# Patient Record
Sex: Male | Born: 1970 | Race: Black or African American | Hispanic: No | Marital: Single | Smoking: Current every day smoker
Health system: Southern US, Community
[De-identification: ages and names within clinical notes are randomized; demographics above are authoritative.]

## PROBLEM LIST (undated history)

## (undated) DIAGNOSIS — I10 Essential (primary) hypertension: Secondary | ICD-10-CM

## (undated) DIAGNOSIS — F209 Schizophrenia, unspecified: Secondary | ICD-10-CM

## (undated) DIAGNOSIS — F319 Bipolar disorder, unspecified: Secondary | ICD-10-CM

## (undated) DIAGNOSIS — M109 Gout, unspecified: Secondary | ICD-10-CM

## (undated) DIAGNOSIS — J189 Pneumonia, unspecified organism: Secondary | ICD-10-CM

## (undated) HISTORY — PX: OTHER SURGICAL HISTORY: SHX169

---

## 1997-10-15 ENCOUNTER — Emergency Department (HOSPITAL_COMMUNITY): Admission: EM | Admit: 1997-10-15 | Discharge: 1997-10-15 | Payer: Self-pay | Admitting: Emergency Medicine

## 1998-01-13 ENCOUNTER — Inpatient Hospital Stay (HOSPITAL_COMMUNITY): Admission: EM | Admit: 1998-01-13 | Discharge: 1998-01-19 | Payer: Self-pay | Admitting: *Deleted

## 1998-05-31 ENCOUNTER — Emergency Department (HOSPITAL_COMMUNITY): Admission: EM | Admit: 1998-05-31 | Discharge: 1998-05-31 | Payer: Self-pay | Admitting: Emergency Medicine

## 1998-05-31 ENCOUNTER — Encounter: Payer: Self-pay | Admitting: Emergency Medicine

## 1998-07-07 ENCOUNTER — Emergency Department (HOSPITAL_COMMUNITY): Admission: EM | Admit: 1998-07-07 | Discharge: 1998-07-07 | Payer: Self-pay | Admitting: Emergency Medicine

## 1998-07-07 ENCOUNTER — Encounter: Payer: Self-pay | Admitting: Emergency Medicine

## 1998-10-01 ENCOUNTER — Emergency Department (HOSPITAL_COMMUNITY): Admission: EM | Admit: 1998-10-01 | Discharge: 1998-10-01 | Payer: Self-pay | Admitting: Emergency Medicine

## 2000-10-17 ENCOUNTER — Encounter: Payer: Self-pay | Admitting: Emergency Medicine

## 2000-10-17 ENCOUNTER — Emergency Department (HOSPITAL_COMMUNITY): Admission: EM | Admit: 2000-10-17 | Discharge: 2000-10-17 | Payer: Self-pay | Admitting: Emergency Medicine

## 2000-10-22 ENCOUNTER — Encounter: Payer: Self-pay | Admitting: Emergency Medicine

## 2000-10-22 ENCOUNTER — Emergency Department (HOSPITAL_COMMUNITY): Admission: EM | Admit: 2000-10-22 | Discharge: 2000-10-22 | Payer: Self-pay | Admitting: Emergency Medicine

## 2000-10-22 ENCOUNTER — Encounter: Payer: Self-pay | Admitting: *Deleted

## 2002-10-21 ENCOUNTER — Inpatient Hospital Stay (HOSPITAL_COMMUNITY): Admission: EM | Admit: 2002-10-21 | Discharge: 2002-10-25 | Payer: Self-pay | Admitting: Psychiatry

## 2002-10-26 ENCOUNTER — Emergency Department (HOSPITAL_COMMUNITY): Admission: EM | Admit: 2002-10-26 | Discharge: 2002-10-26 | Payer: Self-pay | Admitting: Emergency Medicine

## 2002-11-19 ENCOUNTER — Inpatient Hospital Stay (HOSPITAL_COMMUNITY): Admission: EM | Admit: 2002-11-19 | Discharge: 2002-11-28 | Payer: Self-pay | Admitting: Psychiatry

## 2003-01-31 ENCOUNTER — Emergency Department (HOSPITAL_COMMUNITY): Admission: EM | Admit: 2003-01-31 | Discharge: 2003-01-31 | Payer: Self-pay | Admitting: Emergency Medicine

## 2003-05-01 ENCOUNTER — Emergency Department (HOSPITAL_COMMUNITY): Admission: EM | Admit: 2003-05-01 | Discharge: 2003-05-02 | Payer: Self-pay | Admitting: Emergency Medicine

## 2003-05-02 ENCOUNTER — Emergency Department (HOSPITAL_COMMUNITY): Admission: EM | Admit: 2003-05-02 | Discharge: 2003-05-02 | Payer: Self-pay | Admitting: Emergency Medicine

## 2003-12-07 ENCOUNTER — Emergency Department (HOSPITAL_COMMUNITY): Admission: EM | Admit: 2003-12-07 | Discharge: 2003-12-07 | Payer: Self-pay

## 2004-05-03 ENCOUNTER — Emergency Department (HOSPITAL_COMMUNITY): Admission: EM | Admit: 2004-05-03 | Discharge: 2004-05-03 | Payer: Self-pay | Admitting: Emergency Medicine

## 2007-12-27 ENCOUNTER — Ambulatory Visit: Payer: Self-pay | Admitting: Internal Medicine

## 2007-12-27 LAB — CONVERTED CEMR LAB
ALT: 15 units/L (ref 0–53)
Alkaline Phosphatase: 55 units/L (ref 39–117)
BUN: 11 mg/dL (ref 6–23)
Calcium: 9.1 mg/dL (ref 8.4–10.5)
Chloride: 103 meq/L (ref 96–112)
Glucose, Bld: 94 mg/dL (ref 70–99)
HDL: 33 mg/dL — ABNORMAL LOW (ref >39)
LDL Cholesterol: 108 mg/dL — ABNORMAL HIGH (ref 0–99)
Sodium: 138 meq/L (ref 135–145)
TSH: 1.119 microintl units/mL (ref 0.350–5.50)
Total Bilirubin: 0.5 mg/dL (ref 0.3–1.2)
Total Protein: 6.7 g/dL (ref 6.0–8.3)
VLDL: 25 mg/dL (ref 0–40)

## 2010-01-11 ENCOUNTER — Emergency Department (HOSPITAL_COMMUNITY): Admission: EM | Admit: 2010-01-11 | Discharge: 2010-01-11 | Payer: Self-pay | Admitting: Emergency Medicine

## 2010-01-13 ENCOUNTER — Emergency Department (HOSPITAL_COMMUNITY): Admission: EM | Admit: 2010-01-13 | Discharge: 2010-01-13 | Payer: Self-pay | Admitting: Emergency Medicine

## 2010-02-03 ENCOUNTER — Emergency Department (HOSPITAL_COMMUNITY): Admission: EM | Admit: 2010-02-03 | Discharge: 2010-02-03 | Payer: Self-pay | Admitting: Ophthalmology

## 2010-02-26 ENCOUNTER — Emergency Department (HOSPITAL_COMMUNITY): Admission: EM | Admit: 2010-02-26 | Discharge: 2010-02-26 | Payer: Self-pay | Admitting: Emergency Medicine

## 2010-09-25 LAB — BASIC METABOLIC PANEL
GFR calc non Af Amer: >60 mL/min (ref >60)
Potassium: 3.7 mEq/L (ref 3.5–5.1)

## 2010-09-25 LAB — DIFFERENTIAL
Eosinophils Absolute: 0.1 10*3/uL (ref 0.0–0.7)
Eosinophils Relative: 1 % (ref 0–5)
Lymphs Abs: 2.2 10*3/uL (ref 0.7–4.0)
Monocytes Relative: 6 % (ref 3–12)
Neutro Abs: 11.4 10*3/uL — ABNORMAL HIGH (ref 1.7–7.7)

## 2010-09-25 LAB — ABO/RH: ABO/RH(D): O POS

## 2010-09-25 LAB — CBC
HCT: 40.6 % (ref 39.0–52.0)
Hemoglobin: 13.9 g/dL (ref 13.0–17.0)
MCH: 30.4 pg (ref 26.0–34.0)
MCHC: 34.1 g/dL (ref 30.0–36.0)
MCV: 89.2 fL (ref 78.0–100.0)
Platelets: 114 10*3/uL — ABNORMAL LOW (ref 150–400)
RBC: 4.57 MIL/uL (ref 4.22–5.81)
RDW: 13.8 % (ref 11.5–15.5)
RDW: 14.1 % (ref 11.5–15.5)

## 2010-09-25 LAB — URINE MICROSCOPIC-ADD ON

## 2010-09-25 LAB — TYPE AND SCREEN: Antibody Screen: NEGATIVE

## 2010-09-25 LAB — URINALYSIS, ROUTINE W REFLEX MICROSCOPIC
Hgb urine dipstick: NEGATIVE
Leukocytes, UA: NEGATIVE
Specific Gravity, Urine: 1.024 (ref 1.005–1.030)

## 2010-09-25 LAB — RAPID URINE DRUG SCREEN, HOSP PERFORMED
Barbiturates: NOT DETECTED
Benzodiazepines: NOT DETECTED
Opiates: NOT DETECTED

## 2010-09-25 LAB — ETHANOL: Alcohol, Ethyl (B): <5 mg/dL (ref 0–10)
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
## 2010-11-26 NOTE — Discharge Summary (Signed)
NAMEHUDSEN, [name] [name] NO.:  [id number]   MEDICAL RECORD NO.:  [id number]                   PATIENT TYPE:  IPS   LOCATION:  0404                                 FACILITY:  [facility]   PHYSICIAN:  Carolanne Grumbling, M.D.                 DATE OF BIRTH:  Dec 09, 1970   DATE OF ADMISSION:  10/21/2002  DATE OF DISCHARGE:  10/25/2002                                 DISCHARGE SUMMARY   INTRODUCTION:  The patient is a 40 year old male.   INITIAL ASSESSMENT AND DIAGNOSIS:  The patient is a chronic schizophrenic  who is in and out of hospitals.  He has a long history reportedly of not  taking his medications properly which results in readmissions.  He had  missed his Haldol injection and had gotten somewhat out of hand with his  temper and threats towards his mother.  He said he also had been using  cocaine, which certainly did not help his attitude and behavior.  He had  just been released from jail in March of this year because of drug charges  and that apparently was why he missed his last injection because he did not  follow-up.  By the time he was admitted to the hospital, he had already had  the injection and was beginning to do some better.   MENTAL STATUS EXAM:  At the time of the initial evaluation revealed an  alert, oriented young man who was cooperative.  He was somewhat disheveled  and personal hygiene could have been better.  He was concerned about how  long he would have to be in the hospital.  There were no problems related to  his speech.  It was not pressured in any way.  He seemed to be somewhat  guarded and suspicious.  No suicidal thoughts or homicidal thoughts were  present.  Paranoid ideation was reported per history.   PHYSICAL EXAMINATION:  Noncontributory.   ADMISSION DIAGNOSES:   AXIS I:  1. Schizophrenia, paranoid-type.  2. Cocaine abuse and dependence by history.   AXIS II:  No diagnosis.   AXIS III:  No diagnosis.   AXIS IV:   Moderate.   AXIS V:  18/60.   FINDINGS:  All indicated laboratory examinations were within normal limits  or noncontributory except for platelets which were initially 136 and, on  repeat on the 14th, was 133.   HOSPITAL COURSE:  While in the hospital, patient was no problem.  He was  appropriate.  He was cooperative.  Whatever psychosis he had, he kept to  himself.  There was no evidence of any paranoid ideation or delusional  thinking I saw in talking in talking to him or that was reported.  Consequently, because he wanted to be discharged and because there were no  grounds to hold him further, he had consistently denied any suicidal  ideation or threat towards anyone  including his mother, he was discharged  home.  His mother concerned that he was discharged to quickly but she was  reassured that, if problems rose again, she could simply call the magistrate  and have him petitioned for involuntary commitment and he would be brought  back.   DISCHARGE DIAGNOSES:   AXIS I:  1. Schizophrenia, paranoid-type.  2. Cocaine abuse and dependence by history.   AXIS II:  No diagnosis.   AXIS III:  No diagnosis.   AXIS IV:  Moderate.   AXIS V:  Global Assessment of Functioning on discharge 50.   DISCHARGE MEDICATIONS:  1. Haldol 10 mg at bedtime.  2. Cogentin 1 mg twice daily.  3. Monthly Haldol Decanoate injection.   ACTIVITY/DIET:  There were no restrictions placed on his activity or his  diet.   FOLLOW UP:  He was to follow up at the [facility]  with an appointment for the 21st of May with Dr. Lang Snow.                                               Carolanne Grumbling, M.D.    GT/MEDQ  D:  11/14/2002  T:  11/14/2002  Job:  [id number]
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
## 2012-06-18 ENCOUNTER — Emergency Department (HOSPITAL_COMMUNITY): Payer: Medicare Other

## 2012-06-18 ENCOUNTER — Encounter (HOSPITAL_COMMUNITY): Payer: Self-pay | Admitting: *Deleted

## 2012-06-18 ENCOUNTER — Encounter (HOSPITAL_COMMUNITY): Payer: Self-pay | Admitting: Emergency Medicine

## 2012-06-18 ENCOUNTER — Emergency Department (HOSPITAL_COMMUNITY)
Admission: EM | Admit: 2012-06-18 | Discharge: 2012-06-18 | Disposition: A | Discharge status: Home / Self Care | Payer: Medicare Other | Attending: Emergency Medicine | Admitting: Emergency Medicine

## 2012-06-18 ENCOUNTER — Emergency Department (INDEPENDENT_AMBULATORY_CARE_PROVIDER_SITE_OTHER)
Admission: EM | Admit: 2012-06-18 | Discharge: 2012-06-18 | Disposition: A | Discharge status: Other Acute Inpatient Hospital | Payer: Self-pay | Source: Home / Self Care | Attending: Emergency Medicine | Admitting: Emergency Medicine

## 2012-06-18 DIAGNOSIS — R1110 Vomiting, unspecified: Secondary | ICD-10-CM

## 2012-06-18 DIAGNOSIS — F209 Schizophrenia, unspecified: Secondary | ICD-10-CM | POA: Insufficient documentation

## 2012-06-18 DIAGNOSIS — Z79899 Other long term (current) drug therapy: Secondary | ICD-10-CM | POA: Insufficient documentation

## 2012-06-18 DIAGNOSIS — F17200 Nicotine dependence, unspecified, uncomplicated: Secondary | ICD-10-CM | POA: Insufficient documentation

## 2012-06-18 DIAGNOSIS — K2970 Gastritis, unspecified, without bleeding: Secondary | ICD-10-CM | POA: Insufficient documentation

## 2012-06-18 DIAGNOSIS — R112 Nausea with vomiting, unspecified: Secondary | ICD-10-CM | POA: Insufficient documentation

## 2012-06-18 HISTORY — DX: Schizophrenia, unspecified: F20.9

## 2012-06-18 LAB — CBC WITH DIFFERENTIAL/PLATELET
Basophils Absolute: 0 10*3/uL (ref 0.0–0.1)
Basophils Relative: 0 % (ref 0–1)
Eosinophils Relative: 0 % (ref 0–5)
Lymphocytes Relative: 19 % (ref 12–46)
Monocytes Absolute: 0.6 10*3/uL (ref 0.1–1.0)
Monocytes Relative: 6 % (ref 3–12)
Neutrophils Relative %: 74 % (ref 43–77)
Platelets: 143 10*3/uL — ABNORMAL LOW (ref 150–400)
RBC: 5.53 MIL/uL (ref 4.22–5.81)
RDW: 15.4 % (ref 11.5–15.5)
WBC: 9.4 10*3/uL (ref 4.0–10.5)

## 2012-06-18 LAB — COMPREHENSIVE METABOLIC PANEL
Albumin: 3.6 g/dL (ref 3.5–5.2)
CO2: 30 mEq/L (ref 19–32)
Calcium: 9.4 mg/dL (ref 8.4–10.5)
Chloride: 101 mEq/L (ref 96–112)
GFR calc non Af Amer: >90 mL/min (ref >90)
Glucose, Bld: 90 mg/dL (ref 70–99)

## 2012-06-18 LAB — POCT I-STAT TROPONIN I: Troponin i, poc: 0.02 ng/mL (ref 0.00–0.08)

## 2012-06-18 MED ORDER — ONDANSETRON 4 MG PO TBDP: 4.0000 mg | ORAL_TABLET | Freq: Three times a day (TID) | ORAL | Status: DC

## 2012-06-18 MED ORDER — FAMOTIDINE 20 MG PO TABS: 20.0000 mg | ORAL_TABLET | Freq: Two times a day (BID) | ORAL | Status: DC

## 2012-06-18 MED ORDER — ONDANSETRON 4 MG PO TBDP
4.0000 mg | ORAL_TABLET | Freq: Once | ORAL | Status: AC
  Administered 2012-06-18: 4 mg via ORAL
  Filled 2012-06-18: qty 1

## 2012-06-18 MED ORDER — GI COCKTAIL ~~LOC~~
30.0000 mL | Freq: Once | ORAL | Status: AC
  Administered 2012-06-18: 30 mL via ORAL
  Filled 2012-06-18: qty 30

## 2012-06-18 NOTE — ED Notes (Signed)
Pt reports vomiting x 2 days.  Pt reports abdominal cramping and chest pain.  Pt reports SOB.  Pt ambulatory without difficulty.

## 2012-06-18 NOTE — ED Notes (Signed)
Reports nausea and vomiting since last night.  No medication taken to relief sx.  Denies diarrhea.

## 2012-06-18 NOTE — ED Provider Notes (Signed)
History     CSN: [id number]  Arrival date & time 06/18/12  1737   First MD Initiated Contact with Patient 06/18/12 2036      Chief Complaint  Patient presents with  . Emesis    (Consider location/radiation/quality/duration/timing/severity/associated sxs/prior treatment) HPI Comments: This is a 41 year old gentleman, who drank on a daily basis until 3 weeks, ago.  Nares.  Reporting 2, days of epigastric pain, with nausea and vomiting.  If she eats food, but not fluids.  He, says is not nauseated.  In between episodes of vomiting.  He only vomits the food that he ate within 30 minutes.  It denying any shortness of breath, diaphoresis.  Patient is a 41 y.o. male presenting with vomiting. The history is provided by the patient.  Emesis  This is a new problem. The current episode started 2 days ago. The problem occurs 2 to 4 times per day. The problem has not changed since onset.There has been no fever. Associated symptoms include abdominal pain. Pertinent negatives include no chills, no cough, no fever, no headaches and no myalgias.    Past Medical History  Diagnosis Date  . Schizophrenia     History reviewed. No pertinent past surgical history.  History reviewed. No pertinent family history.  History  Substance Use Topics  . Smoking status: Current Every Day Smoker -- 1.0 packs/day    Types: Cigarettes  . Smokeless tobacco: Not on file  . Alcohol Use: No      Review of Systems  Constitutional: Negative for fever and chills.  HENT: Negative for rhinorrhea.   Respiratory: Negative for cough.   Cardiovascular: Negative for chest pain.  Gastrointestinal: Positive for vomiting and abdominal pain.  Musculoskeletal: Negative for myalgias.  Skin: Negative for rash and wound.  Neurological: Negative for dizziness, weakness and headaches.    Allergies  Review of patient's allergies indicates no known allergies.  Home Medications   Current Outpatient Rx  Name  Route  Sig   Dispense  Refill  . BENZTROPINE MESYLATE 2 MG PO TABS   Oral   Take 2 mg by mouth 2 (two) times daily.         [name] Kitchen CLONAZEPAM 1 MG PO TABS   Oral   Take 1 mg by mouth 2 (two) times daily as needed. For anxiety         . HALOPERIDOL 10 MG PO TABS   Oral   Take 10 mg by mouth 4 (four) times daily.         [name] Kitchen FAMOTIDINE 20 MG PO TABS   Oral   Take 1 tablet (20 mg total) by mouth 2 (two) times daily.   30 tablet   0   . ONDANSETRON 4 MG PO TBDP   Oral   Take 1 tablet (4 mg total) by mouth 3 (three) times daily before meals.   20 tablet   0     BP 143/90  Pulse 62  Temp 98.9 F (37.2 C) (Oral)  Resp 20  Ht 5\' 8"  (1.727 m)  Wt 265 lb (120.203 kg)  BMI 40.29 kg/m2  SpO2 98%  Physical Exam  Constitutional: He is oriented to person, place, and time. He appears well-nourished.       Obese  HENT:  Head: Normocephalic.  Eyes: Pupils are equal, round, and reactive to light.  Neck: Normal range of motion.  Cardiovascular: Normal rate.   Pulmonary/Chest: Effort normal and breath sounds normal.  Abdominal: Soft. He exhibits no distension.  There is no hepatosplenomegaly. There is tenderness in the epigastric area. There is no CVA tenderness.    Musculoskeletal: Normal range of motion.  Neurological: He is alert and oriented to person, place, and time.  Skin: Skin is warm.    ED Course  Procedures (including critical care time)  Labs Reviewed  CBC WITH DIFFERENTIAL - Abnormal; Notable for the following:    Platelets 143 (*)     All other components within normal limits  COMPREHENSIVE METABOLIC PANEL  LIPASE, BLOOD  POCT I-STAT TROPONIN I   Dg Chest 2 View  06/18/2012  *RADIOLOGY REPORT*  Clinical Data: Central chest pain and vomiting.  CHEST - 2 VIEW  Comparison: None.  Findings: The heart size and pulmonary vascularity are normal. The lungs appear clear and expanded without focal air space disease or consolidation. No blunting of the costophrenic angles.  No  pneumothorax.  Mediastinal contours appear intact.  IMPRESSION: No evidence of active pulmonary disease.   Original Report Authenticated By: Burman Nieves, M.D.      1. 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

## 2012-06-18 NOTE — ED Provider Notes (Addendum)
History     CSN: [id number]  Arrival date & time 06/18/12  1529   First MD Initiated Contact with Patient 06/18/12 1627      Chief Complaint  Patient presents with  . Nausea  . Emesis    (Consider location/radiation/quality/duration/timing/severity/associated sxs/prior treatment) HPI Comments: Patient presents urgent care describing the since last night he's been nauseous and vomiting having upper abdominal pain. Denies any fevers diarrhea so cold-like symptoms. Denies any shortness of breath when asked about chest pains he points towards a soup sternal and epigastric region.  Patient is a 41 y.o. male presenting with vomiting. The history is provided by the patient.  Emesis  This is a new problem. The current episode started 12 to 24 hours ago. The problem occurs 2 to 4 times per day. The problem has been gradually worsening. The emesis has an appearance of stomach contents. There has been no fever. Associated symptoms include abdominal pain. Pertinent negatives include no arthralgias, no chills, no cough, no diarrhea, no fever, no headaches, no sweats and no URI.    History reviewed. No pertinent past medical history.  No past surgical history on file.  No family history on file.  History  Substance Use Topics  . Smoking status: Not on file  . Smokeless tobacco: Not on file  . Alcohol Use: Not on file      Review of Systems  Constitutional: Positive for activity change. Negative for fever and chills.  Respiratory: Negative for cough, shortness of breath, wheezing and stridor.   Cardiovascular: Negative for palpitations and leg swelling.  Gastrointestinal: Positive for nausea, vomiting and abdominal pain. Negative for diarrhea and blood in stool.  Musculoskeletal: Negative for arthralgias.  Neurological: Negative for dizziness and headaches.    Allergies  Review of patient's allergies indicates no known allergies.  Home Medications   Current Outpatient Rx  Name   Route  Sig  Dispense  Refill  . BENZTROPINE MESYLATE PO   Oral   Take by mouth.         [name] Kitchen KLONOPIN PO   Oral   Take by mouth.         [name] Kitchen HALOPERIDOL PO   Oral   Take by mouth.           BP 126/85  Pulse 64  Temp 98.2 F (36.8 C) (Oral)  Resp 16  SpO2 100%  Physical Exam  Vitals reviewed. Constitutional: Vital signs are normal. He appears well-developed.  Non-toxic appearance. He does not have a sickly appearance. He does not appear ill. No distress.  HENT:  Head: Normocephalic.  Eyes: Conjunctivae normal are normal. No scleral icterus.  Cardiovascular: Normal rate and regular rhythm.  Exam reveals no gallop and no friction rub.   No murmur heard. Pulmonary/Chest: Effort normal and breath sounds normal.  Abdominal: Soft. He exhibits no distension and no mass. There is no tenderness. There is no rebound and no guarding.  Skin: He is not diaphoretic.    ED Course  Procedures (including critical care time)  Labs Reviewed - No data to display No results found.   1. Vomiting       MDM  Vomiting, nausea normal abdominal exam. Abnormal EKG. Will be transfer to the emergency department for further workup and evaluation and monitoring. A caveat applies, for possibly mental retardation    Jimmie Molly, MD 06/18/12 1718  Jimmie Molly, MD 06/18/12 [phone]

## 2012-06-18 NOTE — ED Notes (Signed)
The patient is AOx4 and comfortable with her discharge instructions. 

## 2012-06-19 NOTE — ED Provider Notes (Signed)
Medical screening examination/treatment/procedure(s) were performed by non-physician practitioner and as supervising physician I was immediately available for consultation/collaboration.   Abisola Carrero, MD 06/19/12 0021 

## 2012-11-16 ENCOUNTER — Emergency Department (HOSPITAL_COMMUNITY): Payer: Medicare Other

## 2012-11-16 ENCOUNTER — Other Ambulatory Visit: Payer: Self-pay

## 2012-11-16 ENCOUNTER — Inpatient Hospital Stay (HOSPITAL_COMMUNITY)
Admission: EM | Admit: 2012-11-16 | Discharge: 2012-11-19 | DRG: 070 | Disposition: A | Discharge status: Home / Self Care | Payer: Medicare Other | Attending: Internal Medicine | Admitting: Internal Medicine

## 2012-11-16 ENCOUNTER — Encounter (HOSPITAL_COMMUNITY): Payer: Self-pay | Admitting: Emergency Medicine

## 2012-11-16 DIAGNOSIS — J9601 Acute respiratory failure with hypoxia: Secondary | ICD-10-CM

## 2012-11-16 DIAGNOSIS — Z6838 Body mass index (BMI) 38.0-38.9, adult: Secondary | ICD-10-CM

## 2012-11-16 DIAGNOSIS — J9600 Acute respiratory failure, unspecified whether with hypoxia or hypercapnia: Secondary | ICD-10-CM | POA: Diagnosis present

## 2012-11-16 DIAGNOSIS — E876 Hypokalemia: Secondary | ICD-10-CM | POA: Diagnosis present

## 2012-11-16 DIAGNOSIS — F209 Schizophrenia, unspecified: Secondary | ICD-10-CM | POA: Diagnosis present

## 2012-11-16 DIAGNOSIS — J690 Pneumonitis due to inhalation of food and vomit: Secondary | ICD-10-CM | POA: Diagnosis present

## 2012-11-16 DIAGNOSIS — I10 Essential (primary) hypertension: Secondary | ICD-10-CM | POA: Diagnosis present

## 2012-11-16 DIAGNOSIS — G9340 Encephalopathy, unspecified: Principal | ICD-10-CM | POA: Diagnosis present

## 2012-11-16 DIAGNOSIS — J9602 Acute respiratory failure with hypercapnia: Secondary | ICD-10-CM | POA: Diagnosis present

## 2012-11-16 DIAGNOSIS — R4182 Altered mental status, unspecified: Secondary | ICD-10-CM

## 2012-11-16 DIAGNOSIS — T43505A Adverse effect of unspecified antipsychotics and neuroleptics, initial encounter: Secondary | ICD-10-CM | POA: Diagnosis present

## 2012-11-16 DIAGNOSIS — Y92009 Unspecified place in unspecified non-institutional (private) residence as the place of occurrence of the external cause: Secondary | ICD-10-CM

## 2012-11-16 DIAGNOSIS — F1410 Cocaine abuse, uncomplicated: Secondary | ICD-10-CM | POA: Diagnosis present

## 2012-11-16 DIAGNOSIS — F17200 Nicotine dependence, unspecified, uncomplicated: Secondary | ICD-10-CM | POA: Diagnosis present

## 2012-11-16 LAB — RAPID URINE DRUG SCREEN, HOSP PERFORMED
Amphetamines: NOT DETECTED
Barbiturates: NOT DETECTED
Opiates: NOT DETECTED
Tetrahydrocannabinol: NOT DETECTED

## 2012-11-16 LAB — COMPREHENSIVE METABOLIC PANEL
ALT: 25 U/L (ref 0–53)
AST: 20 U/L (ref 0–37)
Albumin: 3.4 g/dL — ABNORMAL LOW (ref 3.5–5.2)
Alkaline Phosphatase: 61 U/L (ref 39–117)
BUN: 6 mg/dL (ref 6–23)
Chloride: 104 mEq/L (ref 96–112)
Potassium: 3.7 mEq/L (ref 3.5–5.1)
Sodium: 139 mEq/L (ref 135–145)
Total Bilirubin: 0.2 mg/dL — ABNORMAL LOW (ref 0.3–1.2)
Total Protein: 6.2 g/dL (ref 6.0–8.3)

## 2012-11-16 LAB — CBC WITH DIFFERENTIAL/PLATELET
Basophils Relative: 0 % (ref 0–1)
Hemoglobin: 14 g/dL (ref 13.0–17.0)
MCHC: 33.2 g/dL (ref 30.0–36.0)
Monocytes Relative: 8 % (ref 3–12)
Neutro Abs: 7.9 10*3/uL — ABNORMAL HIGH (ref 1.7–7.7)
Neutrophils Relative %: 79 % — ABNORMAL HIGH (ref 43–77)
Platelets: 139 10*3/uL — ABNORMAL LOW (ref 150–400)
RBC: 4.96 MIL/uL (ref 4.22–5.81)

## 2012-11-16 LAB — ACETAMINOPHEN LEVEL: Acetaminophen (Tylenol), Serum: <15 ug/mL (ref 10–30)

## 2012-11-16 LAB — POCT I-STAT, CHEM 8
BUN: 5 mg/dL — ABNORMAL LOW (ref 6–23)
Creatinine, Ser: 0.6 mg/dL (ref 0.50–1.35)
Potassium: 3.8 mEq/L (ref 3.5–5.1)
Sodium: 139 mEq/L (ref 135–145)
TCO2: 29 mmol/L (ref 0–100)

## 2012-11-16 LAB — POCT I-STAT 3, ART BLOOD GAS (G3+)
Bicarbonate: 24.7 mEq/L — ABNORMAL HIGH (ref 20.0–24.0)
Patient temperature: 98.6
TCO2: 26 mmol/L (ref 0–100)
pCO2 arterial: 47.1 mmHg — ABNORMAL HIGH (ref 35.0–45.0)
pH, Arterial: 7.328 — ABNORMAL LOW (ref 7.350–7.450)
pO2, Arterial: 74 mmHg — ABNORMAL LOW (ref 80.0–100.0)

## 2012-11-16 LAB — GLUCOSE, CAPILLARY: Glucose-Capillary: 148 mg/dL — ABNORMAL HIGH (ref 70–99)

## 2012-11-16 LAB — URINALYSIS, ROUTINE W REFLEX MICROSCOPIC
Bilirubin Urine: NEGATIVE
Ketones, ur: NEGATIVE mg/dL
Leukocytes, UA: NEGATIVE
Nitrite: NEGATIVE
Protein, ur: NEGATIVE mg/dL

## 2012-11-16 LAB — SALICYLATE LEVEL: Salicylate Lvl: <2 mg/dL — ABNORMAL LOW (ref 2.8–20.0)

## 2012-11-16 LAB — ETHANOL: Alcohol, Ethyl (B): <11 mg/dL (ref 0–11)

## 2012-11-16 MED ORDER — SODIUM CHLORIDE 0.9 % IV SOLN
1000.0000 mL | Freq: Once | INTRAVENOUS | Status: AC
  Administered 2012-11-16: 1000 mL via INTRAVENOUS

## 2012-11-16 MED ORDER — SODIUM CHLORIDE 0.9 % IV SOLN
INTRAVENOUS | Status: DC
  Administered 2012-11-16 – 2012-11-17 (×3): via INTRAVENOUS

## 2012-11-16 MED ORDER — LIDOCAINE HCL (CARDIAC) 20 MG/ML IV SOLN
INTRAVENOUS | Status: AC
  Filled 2012-11-16: qty 5

## 2012-11-16 MED ORDER — ETOMIDATE 2 MG/ML IV SOLN
INTRAVENOUS | Status: AC
  Administered 2012-11-17: 20 mg
  Filled 2012-11-16: qty 20

## 2012-11-16 MED ORDER — ROCURONIUM BROMIDE 50 MG/5ML IV SOLN
INTRAVENOUS | Status: AC
  Filled 2012-11-16: qty 2

## 2012-11-16 MED ORDER — SUCCINYLCHOLINE CHLORIDE 20 MG/ML IJ SOLN
INTRAMUSCULAR | Status: AC
  Administered 2012-11-17: 100 mg
  Filled 2012-11-16: qty 1

## 2012-11-16 MED ORDER — SODIUM CHLORIDE 0.9 % IV SOLN
1000.0000 mL | INTRAVENOUS | Status: DC
  Administered 2012-11-17 – 2012-11-18 (×2): 1000 mL via INTRAVENOUS

## 2012-11-16 MED ORDER — NALOXONE HCL 0.4 MG/ML IJ SOLN
INTRAMUSCULAR | Status: AC
  Filled 2012-11-16: qty 4

## 2012-11-16 MED ORDER — NALOXONE HCL 0.4 MG/ML IJ SOLN
INTRAMUSCULAR | Status: AC
  Administered 2012-11-16: 2 mg
  Filled 2012-11-16: qty 1

## 2012-11-16 MED ORDER — NALOXONE HCL 1 MG/ML IJ SOLN: 2.0000 mg | Freq: Once | INTRAMUSCULAR | Status: AC

## 2012-11-16 NOTE — ED Notes (Signed)
EDP made aware of GCS of 3

## 2012-11-16 NOTE — ED Provider Notes (Signed)
History     CSN: [id number]  Arrival date & time 11/16/12  2101   First MD Initiated Contact with Patient 11/16/12 2121      Chief Complaint  Patient presents with  . Drug Overdose    (Consider location/radiation/quality/duration/timing/severity/associated sxs/prior treatment) Patient is a 42 y.o. male presenting with Overdose. The history is provided by the EMS personnel. No language interpreter was used.  Drug Overdose This is a new problem. The current episode started less than 1 hour ago (Patient was found in the back seat of his car unresponsive. Paramedics came and gave him intravenous naloxone with some response. He was then brought to [facility] for evaluation.). The problem has not changed since onset.Pertinent negatives include no chest pain, no abdominal pain, no headaches and no shortness of breath. Nothing aggravates the symptoms. Nothing relieves the symptoms. Treatments tried: IV naloxone was given by paramedics, with transient relief.    Past Medical History  Diagnosis Date  . Schizophrenia     History reviewed. No pertinent past surgical history.  No family history on file.  History  Substance Use Topics  . Smoking status: Current Every Day Smoker -- 1.00 packs/day    Types: Cigarettes  . Smokeless tobacco: Not on file  . Alcohol Use: No      Review of Systems  Unable to perform ROS: Patient unresponsive  Respiratory: Negative for shortness of breath.   Cardiovascular: Negative for chest pain.  Gastrointestinal: Negative for abdominal pain.  Neurological: Negative for headaches.    Allergies  Review of patient's allergies indicates no known allergies.  Home Medications   Current Outpatient Rx  Name  Route  Sig  Dispense  Refill  . benztropine (COGENTIN) 2 MG tablet   Oral   Take 2 mg by mouth 2 (two) times daily.         . clonazePAM (KLONOPIN) 1 MG tablet   Oral   Take 1 mg by mouth 2 (two) times daily as needed. For anxiety          . famotidine (PEPCID) 20 MG tablet   Oral   Take 1 tablet (20 mg total) by mouth 2 (two) times daily.   30 tablet   0   . haloperidol (HALDOL) 10 MG tablet   Oral   Take 10 mg by mouth 4 (four) times daily.         . ondansetron (ZOFRAN-ODT) 4 MG disintegrating tablet   Oral   Take 1 tablet (4 mg total) by mouth 3 (three) times daily before meals.   20 tablet   0     BP 120/77  Resp 21  SpO2 97%  Physical Exam  Nursing note and vitals reviewed. Constitutional:  Morbidly obese man, somnolent, with pinpoint pupils. He is breathing oxygen with a nonrebreather mask.  HENT:  Head: Normocephalic and atraumatic.  Right Ear: External ear normal.  Left Ear: External ear normal.  Mouth/Throat: Oropharynx is clear and moist.  Eyes:  Has pinpoint pupils  Neck: Normal range of motion. Neck supple.  Cardiovascular: Normal rate, regular rhythm and normal heart sounds.   Pulmonary/Chest: Effort normal and breath sounds normal.  Abdominal: Soft. Bowel sounds are normal.  Musculoskeletal: Normal range of motion.  Has recent IV site in the right antecubital fossa.  Neurological:  Somnolent. No response to sternal rub.  Skin: Skin is warm and dry.  Psychiatric:  Unable to assess.    ED Course  CRITICAL CARE Performed by: Ignacia Palma  III, Devontay Celaya Authorized by: Osvaldo Human Total critical care time: 60 minutes Critical care was necessary to treat or prevent imminent or life-threatening deterioration of the following conditions: 42 yo man brought in unresponsive by EMS, no response to naloxone, CBC, chemistries, alcohol, acetaminophen and salicylate levels, CXR and CT of head all negative.  Call to iintensivist to admit him to ICU. Critical care was time spent personally by me on the following activities: discussions with consultants, evaluation of patient's response to treatment, examination of patient, obtaining history from patient or surrogate, ordering and performing treatments  and interventions, ordering and review of laboratory studies, ordering and review of radiographic studies, pulse oximetry and re-evaluation of patient's condition.   (including critical care time)   Date: 11/16/2012  Rate: 101  Rhythm: sinus tachycardia  QRS Axis: normal  Intervals: normal QRS:  Poor R wave progression in precordial leads suggests possible old anterior myocardial infarction.  ST/T Wave abnormalities: normal  Conduction Disutrbances:none  Narrative Interpretation: Abnormal EKG  Old EKG Reviewed: unchanged   9:25 PM Pt seen --> physical exam performed.  Lab workup ordered.  IV Fluids, IV naloxone ordered.  10:36 PM Family says they were with him all day.  He had been given an injection of Abilify 400 mg at [facility] today.  Also, he takes clozapine 100 mg, 4 tablets hs, and benztropine 2 mg bid.  Family does not think he OD'd on narcotics, because they were with him all day.    11:59 PM Results for orders placed during the hospital encounter of 11/16/12  ACETAMINOPHEN LEVEL      Result Value Range   Acetaminophen (Tylenol), Serum <15.0  10 - 30 ug/mL  CBC WITH DIFFERENTIAL      Result Value Range   WBC 10.0  4.0 - 10.5 K/uL   RBC 4.96  4.22 - 5.81 MIL/uL   Hemoglobin 14.0  13.0 - 17.0 g/dL   HCT 16.1  09.6 - 04.5 %   MCV 85.1  78.0 - 100.0 fL   MCH 28.2  26.0 - 34.0 pg   MCHC 33.2  30.0 - 36.0 g/dL   RDW 40.9  81.1 - 91.4 %   Platelets 139 (*) 150 - 400 K/uL   Neutrophils Relative 79 (*) 43 - 77 %   Neutro Abs 7.9 (*) 1.7 - 7.7 K/uL   Lymphocytes Relative 13  12 - 46 %   Lymphs Abs 1.3  0.7 - 4.0 K/uL   Monocytes Relative 8  3 - 12 %   Monocytes Absolute 0.8  0.1 - 1.0 K/uL   Eosinophils Relative 0  0 - 5 %   Eosinophils Absolute 0.0  0.0 - 0.7 K/uL   Basophils Relative 0  0 - 1 %   Basophils Absolute 0.0  0.0 - 0.1 K/uL  COMPREHENSIVE METABOLIC PANEL      Result Value Range   Sodium 139  135 - 145 mEq/L   Potassium 3.7  3.5 - 5.1 mEq/L   Chloride 104   96 - 112 mEq/L   CO2 27  19 - 32 mEq/L   Glucose, Bld 112 (*) 70 - 99 mg/dL   BUN 6  6 - 23 mg/dL   Creatinine, Ser 7.82  0.50 - 1.35 mg/dL   Calcium 8.5  8.4 - 95.6 mg/dL   Total Protein 6.2  6.0 - 8.3 g/dL   Albumin 3.4 (*) 3.5 - 5.2 g/dL   AST 20  0 - 37 U/L  ALT 25  0 - 53 U/L   Alkaline Phosphatase 61  39 - 117 U/L   Total Bilirubin 0.2 (*) 0.3 - 1.2 mg/dL   GFR calc non Af Amer >90  >90 mL/min   GFR calc Af Amer >90  >90 mL/min  URINE RAPID DRUG SCREEN (HOSP PERFORMED)      Result Value Range   Opiates NONE DETECTED  NONE DETECTED   Cocaine NONE DETECTED  NONE DETECTED   Benzodiazepines NONE DETECTED  NONE DETECTED   Amphetamines NONE DETECTED  NONE DETECTED   Tetrahydrocannabinol NONE DETECTED  NONE DETECTED   Barbiturates NONE DETECTED  NONE DETECTED  ETHANOL      Result Value Range   Alcohol, Ethyl (B) <11  0 - 11 mg/dL  SALICYLATE LEVEL      Result Value Range   Salicylate Lvl <2.0 (*) 2.8 - 20.0 mg/dL  URINALYSIS, ROUTINE W REFLEX MICROSCOPIC      Result Value Range   Color, Urine YELLOW  YELLOW   APPearance CLEAR  CLEAR   Specific Gravity, Urine 1.010  1.005 - 1.030   pH 6.0  5.0 - 8.0   Glucose, UA NEGATIVE  NEGATIVE mg/dL   Hgb urine dipstick NEGATIVE  NEGATIVE   Bilirubin Urine NEGATIVE  NEGATIVE   Ketones, ur NEGATIVE  NEGATIVE mg/dL   Protein, ur NEGATIVE  NEGATIVE mg/dL   Urobilinogen, UA 0.2  0.0 - 1.0 mg/dL   Nitrite NEGATIVE  NEGATIVE   Leukocytes, UA NEGATIVE  NEGATIVE  GLUCOSE, CAPILLARY      Result Value Range   Glucose-Capillary 148 (*) 70 - 99 mg/dL   Comment 1 Documented in Chart     Comment 2 Notify RN    POCT I-STAT 3, BLOOD GAS (G3+)      Result Value Range   pH, Arterial 7.328 (*) 7.350 - 7.450   pCO2 arterial 47.1 (*) 35.0 - 45.0 mmHg   pO2, Arterial 74.0 (*) 80.0 - 100.0 mmHg   Bicarbonate 24.7 (*) 20.0 - 24.0 mEq/L   TCO2 26  0 - 100 mmol/L   O2 Saturation 93.0     Acid-base deficit 2.0  0.0 - 2.0 mmol/L   Patient temperature  98.6 F     Collection site RADIAL, ALLEN'S TEST ACCEPTABLE     Drawn by RT     Sample type ARTERIAL    POCT I-STAT, CHEM 8      Result Value Range   Sodium 139  135 - 145 mEq/L   Potassium 3.8  3.5 - 5.1 mEq/L   Chloride 102  96 - 112 mEq/L   BUN 5 (*) 6 - 23 mg/dL   Creatinine, Ser 1.61  0.50 - 1.35 mg/dL   Glucose, Bld 096 (*) 70 - 99 mg/dL   Calcium, Ion 0.45  4.09 - 1.23 mmol/L   TCO2 29  0 - 100 mmol/L   Hemoglobin 15.3  13.0 - 17.0 g/dL   HCT 81.1  91.4 - 78.2 %   Ct Head Wo Contrast  11/16/2012  *RADIOLOGY REPORT*  Clinical Data: Patient unresponsive.  CT HEAD WITHOUT CONTRAST  Technique:  Contiguous axial images were obtained from the base of the skull through the vertex without contrast.  Comparison: None.  Findings: No evidence of acute intracranial abnormality including infarction, hemorrhage, mass lesion, mass effect, midline shift or abnormal extra-axial fluid collection is identified.  There is no hydrocephalus or pneumocephalus.  The calvarium is intact.  Imaged paranasal sinuses and mastoid  air cells are clear.  IMPRESSION: Negative exam.   Original Report Authenticated By: Holley Dexter, M.D.    Dg Chest Port 1 View  11/16/2012  *RADIOLOGY REPORT*  Clinical Data: Drug overdose.  PORTABLE CHEST - 1 VIEW  Comparison: Chest 06/18/2012.  Findings: Lung volumes are low with basilar atelectasis.  No pneumothorax or pleural effusion.  Heart size normal.  IMPRESSION: No acute finding in a low-volume chest.   Original Report Authenticated By: Holley Dexter, M.D.     Lab tests do not show any cause for his altered mental status.  Will call Pulmonary and Critical Care Medicine to see him.    12:15 AM Case discussed with Shelba Flake of PCCM.  She will have the critical care fellow see and admit pt.  Call to Neurology to consult also.  1. Altered mental status             Carleene Cooper III, MD 11/17/12 [phone]

## 2012-11-16 NOTE — ED Notes (Signed)
Per EMS pt was found in back seat of car parked in his drive way unresponsive, with snoring respirations. Family states pt uses cocaine but denies other drug use. EMS gave 2mg  Narcan IV. EMS suctioned vomit to clear airway and applied NRB mask. Lung sounds clear. Pt responding to sternal rub.

## 2012-11-16 NOTE — ED Notes (Signed)
New and old EKG given to Dr. Ignacia Palma. Copy placed in pt chart.

## 2012-11-16 NOTE — ED Notes (Addendum)
RN transported pt to radiology for CT. Pt SpO2 decreased to 92% on NRB mask, Dr. Ignacia Palma notified. Pt unable to clear secretions, suctioned oral secretions and attempted to insert nasal trumpet adjunct airway unsuccessfully.

## 2012-11-17 ENCOUNTER — Inpatient Hospital Stay (HOSPITAL_COMMUNITY): Payer: Medicare Other

## 2012-11-17 ENCOUNTER — Emergency Department (HOSPITAL_COMMUNITY): Payer: Medicare Other | Admitting: Anesthesiology

## 2012-11-17 ENCOUNTER — Encounter (HOSPITAL_COMMUNITY): Payer: Self-pay | Admitting: Anesthesiology

## 2012-11-17 DIAGNOSIS — J690 Pneumonitis due to inhalation of food and vomit: Secondary | ICD-10-CM | POA: Diagnosis present

## 2012-11-17 DIAGNOSIS — J9602 Acute respiratory failure with hypercapnia: Secondary | ICD-10-CM | POA: Diagnosis present

## 2012-11-17 DIAGNOSIS — R4182 Altered mental status, unspecified: Secondary | ICD-10-CM

## 2012-11-17 DIAGNOSIS — J9600 Acute respiratory failure, unspecified whether with hypoxia or hypercapnia: Secondary | ICD-10-CM

## 2012-11-17 DIAGNOSIS — G9340 Encephalopathy, unspecified: Secondary | ICD-10-CM

## 2012-11-17 DIAGNOSIS — T17308A Unspecified foreign body in larynx causing other injury, initial encounter: Secondary | ICD-10-CM

## 2012-11-17 LAB — BLOOD GAS, ARTERIAL
Drawn by: 280981
MECHVT: 620 mL
PEEP: 5 cmH2O
Patient temperature: 98.6
RATE: 18 resp/min
pH, Arterial: 7.427 (ref 7.350–7.450)

## 2012-11-17 LAB — URINE MICROSCOPIC-ADD ON

## 2012-11-17 LAB — MAGNESIUM: Magnesium: 1.6 mg/dL (ref 1.5–2.5)

## 2012-11-17 LAB — CBC
HCT: 46.5 % (ref 39.0–52.0)
Hemoglobin: 15.4 g/dL (ref 13.0–17.0)
Hemoglobin: 15.5 g/dL (ref 13.0–17.0)
MCH: 28 pg (ref 26.0–34.0)
MCHC: 33.1 g/dL (ref 30.0–36.0)
MCHC: 32.6 g/dL (ref 30.0–36.0)
RBC: 5.47 MIL/uL (ref 4.22–5.81)

## 2012-11-17 LAB — BASIC METABOLIC PANEL
Chloride: 101 mEq/L (ref 96–112)
Creatinine, Ser: 0.6 mg/dL (ref 0.50–1.35)
GFR calc Af Amer: >90 mL/min (ref >90)
Potassium: 4.3 mEq/L (ref 3.5–5.1)

## 2012-11-17 LAB — CREATININE, SERUM
GFR calc Af Amer: >90 mL/min (ref >90)
GFR calc non Af Amer: >90 mL/min (ref >90)

## 2012-11-17 LAB — URINALYSIS, ROUTINE W REFLEX MICROSCOPIC
Leukocytes, UA: NEGATIVE
Nitrite: NEGATIVE
Protein, ur: NEGATIVE mg/dL
Specific Gravity, Urine: 1.017 (ref 1.005–1.030)
Urobilinogen, UA: 1 mg/dL (ref 0.0–1.0)

## 2012-11-17 MED ORDER — PROPOFOL 10 MG/ML IV EMUL
5.0000 ug/kg/min | INTRAVENOUS | Status: DC
  Administered 2012-11-17 (×2): 40 ug/kg/min via INTRAVENOUS
  Administered 2012-11-17: 50 ug/kg/min via INTRAVENOUS
  Administered 2012-11-17: 15 ug/kg/min via INTRAVENOUS
  Administered 2012-11-18: 30 ug/kg/min via INTRAVENOUS
  Administered 2012-11-18: 50 ug/kg/min via INTRAVENOUS
  Filled 2012-11-17 (×8): qty 100

## 2012-11-17 MED ORDER — FENTANYL CITRATE 0.05 MG/ML IJ SOLN
INTRAMUSCULAR | Status: AC
  Filled 2012-11-17: qty 4

## 2012-11-17 MED ORDER — PANTOPRAZOLE SODIUM 40 MG IV SOLR
40.0000 mg | Freq: Every day | INTRAVENOUS | Status: DC
  Administered 2012-11-17: 40 mg via INTRAVENOUS
  Filled 2012-11-17 (×2): qty 40

## 2012-11-17 MED ORDER — MIDAZOLAM HCL 2 MG/2ML IJ SOLN
INTRAMUSCULAR | Status: AC
  Administered 2012-11-17: 2 mg
  Filled 2012-11-17: qty 4

## 2012-11-17 MED ORDER — SUCCINYLCHOLINE CHLORIDE 20 MG/ML IJ SOLN
200.0000 mg | Freq: Once | INTRAMUSCULAR | Status: AC
  Administered 2012-11-17: 200 mg via INTRAVENOUS

## 2012-11-17 MED ORDER — CHLORHEXIDINE GLUCONATE 0.12 % MT SOLN
15.0000 mL | Freq: Two times a day (BID) | OROMUCOSAL | Status: DC
  Administered 2012-11-17 – 2012-11-18 (×3): 15 mL via OROMUCOSAL
  Filled 2012-11-17 (×3): qty 15

## 2012-11-17 MED ORDER — PROPOFOL 10 MG/ML IV EMUL
INTRAVENOUS | Status: AC
  Filled 2012-11-17: qty 100

## 2012-11-17 MED ORDER — GADOBENATE DIMEGLUMINE 529 MG/ML IV SOLN
20.0000 mL | Freq: Once | INTRAVENOUS | Status: AC
  Administered 2012-11-17: 20 mL via INTRAVENOUS

## 2012-11-17 MED ORDER — ONDANSETRON HCL 4 MG/2ML IJ SOLN
4.0000 mg | Freq: Once | INTRAMUSCULAR | Status: AC
  Administered 2012-11-17: 4 mg via INTRAVENOUS

## 2012-11-17 MED ORDER — SODIUM CHLORIDE 0.9 % IV SOLN
250.0000 mL | INTRAVENOUS | Status: DC | PRN
  Administered 2012-11-19: 250 mL via INTRAVENOUS

## 2012-11-17 MED ORDER — ETOMIDATE 2 MG/ML IV SOLN
INTRAVENOUS | Status: AC
  Administered 2012-11-17: 20 mg
  Filled 2012-11-17: qty 10

## 2012-11-17 MED ORDER — VECURONIUM BROMIDE 10 MG IV SOLR
INTRAVENOUS | Status: AC
  Filled 2012-11-17: qty 10

## 2012-11-17 MED ORDER — PIPERACILLIN-TAZOBACTAM 3.375 G IVPB
3.3750 g | Freq: Three times a day (TID) | INTRAVENOUS | Status: DC
  Administered 2012-11-17 – 2012-11-19 (×8): 3.375 g via INTRAVENOUS
  Filled 2012-11-17 (×10): qty 50

## 2012-11-17 MED ORDER — PROPOFOL 10 MG/ML IV EMUL
5.0000 ug/kg/min | INTRAVENOUS | Status: DC
  Administered 2012-11-17: 20 ug/kg/min via INTRAVENOUS

## 2012-11-17 MED ORDER — BIOTENE DRY MOUTH MT LIQD
15.0000 mL | Freq: Four times a day (QID) | OROMUCOSAL | Status: DC
  Administered 2012-11-17 – 2012-11-18 (×5): 15 mL via OROMUCOSAL

## 2012-11-17 MED ORDER — HEPARIN SODIUM (PORCINE) 5000 UNIT/ML IJ SOLN
5000.0000 [IU] | Freq: Three times a day (TID) | INTRAMUSCULAR | Status: DC
  Administered 2012-11-17 – 2012-11-19 (×7): 5000 [IU] via SUBCUTANEOUS
  Filled 2012-11-17 (×10): qty 1

## 2012-11-17 MED ORDER — FENTANYL CITRATE 0.05 MG/ML IJ SOLN
25.0000 ug | INTRAMUSCULAR | Status: DC | PRN
  Administered 2012-11-17: 100 ug via INTRAVENOUS

## 2012-11-17 MED ORDER — MIDAZOLAM HCL 2 MG/2ML IJ SOLN
2.0000 mg | INTRAMUSCULAR | Status: DC | PRN
Start: 2012-11-17 — End: 2012-11-17
  Administered 2012-11-17 (×2): 2 mg via INTRAVENOUS
  Filled 2012-11-17 (×2): qty 2

## 2012-11-17 NOTE — ED Notes (Signed)
Neurologist and Anesthesia at bedside

## 2012-11-17 NOTE — ED Notes (Signed)
ICU MD, Respiratory Therapist, Valentina Shaggy RN, and Irving Burton RN at bedside

## 2012-11-17 NOTE — ED Notes (Signed)
Anesthesia attempting intubation at this time.

## 2012-11-17 NOTE — Procedures (Signed)
Bronchoscopy Procedure Note Verna Hamon [id number] 1970-07-22  Procedure: Bronchoscopy Indications: Diagnostic evaluation of the airways  Procedure Details Consent: Unable to obtain consent because of emergent medical necessity. Time Out: Verified patient identification, verified procedure, site/side was marked, verified correct patient position, special equipment/implants available, medications/allergies/relevent history reviewed, required imaging and test results available.  Performed  In preparation for procedure, patient was given 100% FiO2. Sedation: Etomidate  Bronchoscopy performed to confirm tube position - ETT seen in esophagus. Videolaryngoscope then used to reintubate - ETT in broncho reconfirmed by bscope & secretions removed Airway entered and the following bronchi were examined: Bronchi.   Procedures performed: Brushings performed Bronchoscope removed.    Evaluation Hemodynamic Status: BP stable throughout; O2 sats: transiently fell during during procedure Patient's Current Condition: stable Specimens:  None Complications: No apparent complications Patient did tolerate procedure well.   ALVA,RAKESH V. 11/17/2012

## 2012-11-17 NOTE — ED Notes (Addendum)
Anesthesia responded to page, notified of need for assistance. Pt vomiting, suction being performed by respiratory, airway patent.

## 2012-11-17 NOTE — Procedures (Signed)
Intubation Procedure Note Andrew Blasius [id number] 1970-10-03  Procedure: Intubation Indications: ETT needed to be changed  Procedure Details Consent: Unable to obtain consent because of altered level of consciousness. Time Out: Verified patient identification, verified procedure, site/side was marked, verified correct patient position, special equipment/implants available, medications/allergies/relevent history reviewed, required imaging and test results available.  Performed  Maximum sterile technique was used including gloves and mask.  4 Glidescope used to visualise cords - ETT seen in esophagus, removed & new 7.5 ETT inserted under vision into cords -immediate improvement in satn, confirmed by capnometer & auscultation   Evaluation Hemodynamic Status: BP stable throughout; O2 sats: transiently fell during during procedure Patient's Current Condition: stable Complications: No apparent complications Patient did tolerate procedure well. Chest X-ray ordered to verify placement.  CXR: pending.   ALVA,RAKESH V. 11/17/2012

## 2012-11-17 NOTE — Progress Notes (Signed)
PULMONARY  / CRITICAL CARE MEDICINE  Name: [name] [name] MRN: [id number] DOB: Jul 08, 1971    ADMISSION DATE:  11/16/2012   PRIMARY SERVICE: PCCM  CHIEF COMPLAINT:  Altered mental status  BRIEF PATIENT DESCRIPTION:  42 years old male with PMH relevant for schizophrenia and HTN. Presents after becoming unresponsive. Intubated for airway protection.  SIGNIFICANT EVENTS / STUDIES:  - Aspiration of gastric contents during intubation  LINES / TUBES: - Peripheral lines  CULTURES: resp 5/9 >>  ANTIBIOTICS: - Zosyn 5/9 >>  HISTORY OF PRESENT ILLNESS:   42 years old male with PMH relevant for schizophrenia, HTN and also history of crack abuse. As per his sister, he was doing well this morning, today had a shot of Abilify 400 mg IM, and then took his usual medications including clozapine and benztropine. At about 8 pm he started having confusion and slurred speech and the became unresponsive. At arrival to the ED the patient was unresponsive to verbal and tactile stimuli, breathing spontaneously, hemodynamically stable. CT scan of the head was unremarkable. Urine toxicology screen negative. We decided to intubate for airway protection. During intubation he vomited and aspirated. He remained hemodynamically stable. Neurology at bedside recommended MRI of the brain.   SUBJECTIVE:  Was called emergently to bedside since difficult to oxygenate, vomitus , on arrival found pt with satn 85%, being bagged by RT, Air entry auscultated in stomach, reintubated via video laryngoscope - see separate parocedure note  VITAL SIGNS: Temp:  [95.7 F (35.4 C)-98.4 F (36.9 C)] 98.4 F (36.9 C) (05/10 0300) Pulse Rate:  [62-126] 81 (05/10 0600) Resp:  [18-44] 18 (05/10 0600) BP: (112-190)/(62-111) 112/77 mmHg (05/10 0600) SpO2:  [78 %-100 %] 100 % (05/10 0600) FiO2 (%):  [40 %-100 %] 40 % (05/10 0613) Weight:  [120 kg (264 lb 8.8 oz)-129.5 kg (285 lb 7.9 oz)] 129.5 kg (285 lb 7.9 oz) (05/10  0500) HEMODYNAMICS:   VENTILATOR SETTINGS: Vent Mode:  [-] PRVC FiO2 (%):  [40 %-100 %] 40 % Set Rate:  [16 bmp-18 bmp] 18 bmp Vt Set:  [500 mL-620 mL] 620 mL PEEP:  [5 cmH20] 5 cmH20 Plateau Pressure:  [14 cmH20-29 cmH20] 19 cmH20 INTAKE / OUTPUT: Intake/Output     05/09 0701 - 05/10 0700 05/10 0701 - 05/11 0700   I.V. (mL/kg) 250 (1.9)    IV Piggyback 50    Total Intake(mL/kg) 300 (2.3)    Urine (mL/kg/hr) 1850    Total Output 1850     Net -1550            PHYSICAL EXAMINATION: General: Unresponsive, intubated. Eyes: Anicteric sclerae. ENT:  Dry mucous membranes. No thrush Lymph: No cervical, supraclavicular, or axillary lymphadenopathy. Heart: Normal S1, S2. No murmurs, rubs, or gallops appreciated. No bruits, equal pulses. Lungs: Normal excursion. Good air movement bilaterally, bilateral diffuse crackles after aspiration event.  Abdomen: Abdomen soft, non-tender and not distended, normoactive bowel sounds. No hepatosplenomegaly or masses. Musculoskeletal: No clubbing or synovitis. Skin: No rashes or lesions Neuro: Unresponsive to verbal and painful stimuli. Breathing spontaneously.  LABS:  Recent Labs Lab 11/16/12 2200 11/16/12 2221 11/16/12 2233 11/17/12 0235  HGB 14.0  --  15.3 15.4  WBC 10.0  --   --  17.8*  PLT 139*  --   --  133*  NA 139  --  139  --   K 3.7  --  3.8  --   CL 104  --  102  --   CO2 27  --   --   --  GLUCOSE 112*  --  107*  --   BUN 6  --  5*  --   CREATININE 0.71  --  0.60 0.67  CALCIUM 8.5  --   --   --   AST 20  --   --   --   ALT 25  --   --   --   ALKPHOS 61  --   --   --   BILITOT 0.2*  --   --   --   PROT 6.2  --   --   --   ALBUMIN 3.4*  --   --   --   PHART  --  7.328*  --   --   PCO2ART  --  47.1*  --   --   PO2ART  --  74.0*  --   --     Recent Labs Lab 11/16/12 2316  GLUCAP 148*    CXR: Pending  ASSESSMENT / PLAN:  PULMONARY A: 1) Respiratory failure due to inability to protect his airway 2) Aspiration  event during intubation P:   - Mechanical ventilation   - PRVC, Vt: 8cc/kg, PEEP: 5, RR: 16, FiO2: 100% and adjust to keep O2 sat > 94% - VAP prevention order set - Zosyn for aspiration, chk resp cx -  follow post intubation chest X ray - hold off extubation  CARDIOVASCULAR A:  1) Hemodynamically stable P:  - ICU monitoring  RENAL A:   1) No issues P:   - Will follow chemistry in am  GASTROINTESTINAL A:   1) Aspiration P:   - GI prophylaxis with protonix -OG to LIS  HEMATOLOGIC A:   1) No issues P:  - Will follow CBC in am  INFECTIOUS A:   1) Aspiration event P:   - Zosyn  ENDOCRINE A:   1) CBG checks while npo  NEUROLOGIC A:   1) Altered mental status of unclear etiology. In the differential are antipsychotics interactions, brain stem lesion, less likely drug overdose given negative urinary toxicology screen. P:   - Neurology input appreciated - Will get MRI of the brain with and without contrast.  - propofol & int fent for sedation   I have personally obtained a history, examined the patient, evaluated laboratory and imaging results, formulated the assessment and plan and placed orders. CRITICAL CARE: The patient is critically ill with multiple organ systems failure and requires high complexity decision making for assessment and support, frequent evaluation and titration of therapies, application of advanced monitoring technologies and extensive interpretation of multiple databases. Critical Care Time devoted to patient care services described in this note is 60 minutes.    Cyril Mourning MD. Tonny Bollman. [facility] Pager [phone] If no response call [phone]    11/17/2012, 7:37 AM

## 2012-11-17 NOTE — Progress Notes (Signed)
INITIAL NUTRITION ASSESSMENT  DOCUMENTATION CODES Per approved criteria  -Obesity Unspecified   INTERVENTION: 1. Recommend initiation of nutrition support within 24-48 hours of intubation. If enteral nutrition warranted, recommend initiation of Promote via enteral feeding tube at 20 ml/hr. Advance by 10 ml/hr every 4 hours to goal of 30 ml/hr. 60 ml Prostat liquid protein via tube QID. Goal nutrition regimen will provide: 1520 kcal, 165 grams protein (100% estimated protein needs) and 604 ml free water. Total calorie provision with current propofol rate will be: 1900 calories (23 kcal/kg IBW.) 2. RD to continue to follow nutrition care plan  NUTRITION DIAGNOSIS: Inadequate oral intake related to inability to eat as evidenced by NPO status.   Goal: 1. Initiation of nutrition support within 24 - 48 hours of intubation. 2. Nutrition support to provide 60-70% of estimated calorie needs (22-25 kcals/kg ideal body weight) and 100% of estimated protein needs, based on ASPEN guidelines for permissive underfeeding in critically ill obese individuals.  Monitor:  weight trends, lab trends, I/O's, vent settings, initiation of nutrition support  Reason for Assessment: VDRF  42 y.o. male  Admitting Dx: AMS 2/2 unknown etiology  ASSESSMENT: Pt found in back seat of car in his driveway, unresponsive last evening. According to patient's family, he was just started on abilify and became confused and less responsive after receiving a dose that day. PMHx significant for HTN, schizophrenia, and crack-cocaine use. Urine drug screen negative.   During intubation, pt vomited and aspirated. OGT to LIS at this time.  Patient is currently intubated on ventilator support.  MV: 12 Temp:Temp (24hrs), Avg:98 F (36.7 C), Min:95.7 F (35.4 C), Max:100.2 F (37.9 C)  Propofol: 14.4 ml/hr - provides 380 kcal/d  Most recent magnesium, phosphorus, and potassium WNL.  Height: Ht Readings from Last 1  Encounters:  11/17/12 6' (1.829 m)    Weight: Wt Readings from Last 1 Encounters:  11/17/12 285 lb 7.9 oz (129.5 kg)    Ideal Body Weight: 178 lb/80.9 kg  % Ideal Body Weight: 160%  Wt Readings from Last 10 Encounters:  11/17/12 285 lb 7.9 oz (129.5 kg)  06/18/12 265 lb (120.203 kg)    Usual Body Weight: 265 lb  % Usual Body Weight: 108%  BMI:  Body mass index is 38.71 kg/(m^2). Obese Class II  Estimated Nutritional Needs: Kcal: 2645 kcal Protein: at least 162 grams Fluid: 1.8 - 2 liters daily  Skin: intact  Diet Order: NPO  EDUCATION NEEDS: -No education needs identified at this time   Intake/Output Summary (Last 24 hours) at 11/17/12 1332 Last data filed at 11/17/12 1200  Gross per 24 hour  Intake   1069 ml  Output   2245 ml  Net  -1176 ml    Last BM: PTA  Labs:   Recent Labs Lab 11/16/12 2200 11/16/12 2233 11/17/12 0235 11/17/12 0659  NA 139 139  --  134*  K 3.7 3.8  --  4.3  CL 104 102  --  101  CO2 27  --   --  21  BUN 6 5*  --  4*  CREATININE 0.71 0.60 0.67 0.60  CALCIUM 8.5  --   --  8.8  MG  --   --   --  1.6  PHOS  --   --   --  2.5  GLUCOSE 112* 107*  --  101*    CBG (last 3)   Recent Labs  11/16/12 2316  GLUCAP 148*    Scheduled Meds: .  antiseptic oral rinse  15 mL Mouth Rinse QID  . chlorhexidine  15 mL Mouth Rinse BID  . fentaNYL      . heparin  5,000 Units Subcutaneous Q8H  . pantoprazole (PROTONIX) IV  40 mg Intravenous QHS  . piperacillin-tazobactam (ZOSYN)  IV  3.375 g Intravenous Q8H  . propofol      . vecuronium        Continuous Infusions: . sodium chloride 250 mL/hr at 11/17/12 0840  . sodium chloride 1,000 mL (11/17/12 0900)  . propofol 20 mcg/kg/min (11/17/12 1329)    Past Medical History  Diagnosis Date  . Schizophrenia     History reviewed. No pertinent past surgical history.  Jarold Motto MS, RD, LDN Pager: [phone] After-hours pager: [phone]

## 2012-11-17 NOTE — ED Notes (Signed)
First intubation attempt unsuccessful, bagging pt x1 min.

## 2012-11-17 NOTE — ED Notes (Addendum)
3rd Attempt successful, CO2 yellow

## 2012-11-17 NOTE — H&P (Signed)
PULMONARY  / CRITICAL CARE MEDICINE  Name: [name] [name] MRN: [id number] DOB: September 30, 1970    ADMISSION DATE:  11/16/2012   PRIMARY SERVICE: PCCM  CHIEF COMPLAINT:  Altered mental status  BRIEF PATIENT DESCRIPTION:  42 years old male with PMH relevant for schizophrenia and HTN. Presents after becoming unresponsive. Intubated for airway protection.  SIGNIFICANT EVENTS / STUDIES:  - Aspiration of gastric contents during intubation  LINES / TUBES: - Peripheral lines  CULTURES: Not available  ANTIBIOTICS: - Zosyn  HISTORY OF PRESENT ILLNESS:   42 years old male with PMH relevant for schizophrenia, HTN and also history of crack abuse. As per his sister, he was doing well this morning, today had a shot of Abilify 400 mg IM, and then took his usual medications including clozapine and benztropine. At about 8 pm he started having confusion and slurred speech and the became unresponsive. At arrival to the ED the patient was unresponsive to verbal and tactile stimuli, breathing spontaneously, hemodynamically stable. CT scan of the head was unremarkable. Urine toxicology screen negative. We decided to intubate for airway protection. During intubation he vomited and aspirated. He remained hemodynamically stable. Neurology at bedside recommended MRI of the brain.  PAST MEDICAL HISTORY :  Past Medical History  Diagnosis Date  . Schizophrenia    History reviewed. No pertinent past surgical history. Prior to Admission medications   Medication Sig Start Date End Date Taking? Authorizing Provider  ARIPiprazole (ABILIFY IM) Inject 400 mg into the muscle every 30 (thirty) days.   Yes Historical Provider, MD  benztropine (COGENTIN) 2 MG tablet Take 2 mg by mouth 2 (two) times daily.   Yes Historical Provider, MD  cloZAPine (CLOZARIL) 100 MG tablet Take 100 mg by mouth daily.   Yes Historical Provider, MD   No Known Allergies  FAMILY HISTORY:  No family history on file. SOCIAL HISTORY:  reports  that he has been smoking Cigarettes.  He has been smoking about 1.00 pack per day. He does not have any smokeless tobacco history on file. He reports that he does not drink alcohol or use illicit drugs.  REVIEW OF SYSTEMS:  Unable to provide.   SUBJECTIVE:   VITAL SIGNS: Temp:  [95.7 F (35.4 C)] 95.7 F (35.4 C) (05/10 0022) Pulse Rate:  [80-124] 124 (05/10 0055) Resp:  [21-44] 21 (05/10 0055) BP: (120-190)/(62-99) 179/94 mmHg (05/10 0055) SpO2:  [78 %-99 %] 99 % (05/10 0055) FiO2 (%):  [100 %] 100 % (05/10 0100) HEMODYNAMICS:   VENTILATOR SETTINGS: Vent Mode:  [-] PRVC FiO2 (%):  [100 %] 100 % Set Rate:  [16 bmp] 16 bmp Vt Set:  [500 mL] 500 mL PEEP:  [5 cmH20] 5 cmH20 Plateau Pressure:  [29 cmH20] 29 cmH20 INTAKE / OUTPUT: Intake/Output   None     PHYSICAL EXAMINATION: General: Unresponsive, intubated. Eyes: Anicteric sclerae. ENT:  Dry mucous membranes. No thrush Lymph: No cervical, supraclavicular, or axillary lymphadenopathy. Heart: Normal S1, S2. No murmurs, rubs, or gallops appreciated. No bruits, equal pulses. Lungs: Normal excursion. Good air movement bilaterally, bilateral diffuse crackles after aspiration event.  Abdomen: Abdomen soft, non-tender and not distended, normoactive bowel sounds. No hepatosplenomegaly or masses. Musculoskeletal: No clubbing or synovitis. Skin: No rashes or lesions Neuro: Unresponsive to verbal and painful stimuli. Breathing spontaneously.  LABS:  Recent Labs Lab 11/16/12 2200 11/16/12 2221 11/16/12 2233  HGB 14.0  --  15.3  WBC 10.0  --   --   PLT 139*  --   --  NA 139  --  139  K 3.7  --  3.8  CL 104  --  102  CO2 27  --   --   GLUCOSE 112*  --  107*  BUN 6  --  5*  CREATININE 0.71  --  0.60  CALCIUM 8.5  --   --   AST 20  --   --   ALT 25  --   --   ALKPHOS 61  --   --   BILITOT 0.2*  --   --   PROT 6.2  --   --   ALBUMIN 3.4*  --   --   PHART  --  7.328*  --   PCO2ART  --  47.1*  --   PO2ART  --  74.0*  --      Recent Labs Lab 11/16/12 2316  GLUCAP 148*    CXR: Pending  ASSESSMENT / PLAN:  PULMONARY A: 1) Respiratory failure due to inability to protect his airway 2) Aspiration event during intubation P:   - Will admit to ICU - Mechanical ventilation   - PRVC, Vt: 8cc/kg, PEEP: 5, RR: 16, FiO2: 100% and adjust to keep O2 sat > 94% - VAP prevention order set - Zosyn - Will follow post intubation chest X ray - SBT in am if mental status improves  CARDIOVASCULAR A:  1) Hemodynamically stable P:  - ICU monitoring  RENAL A:   1) No issues P:   - Will follow chemistry in am  GASTROINTESTINAL A:   1) No issues P:   - GI prophylaxis with protonix  HEMATOLOGIC A:   1) No issues P:  - Will follow CBC in am  INFECTIOUS A:   1) Aspiration event P:   - Zosyn  ENDOCRINE A:   1) No issues  NEUROLOGIC A:   1) Altered mental status of unclear etiology. In the differential are antipsychotics interactions, brain stem lesion, less likely drug overdose given negative urinary toxicology screen. P:   - Neurology input appreciated - Will get MRI of the brain wit and without contrast.  - Intermittent sedation with PRN versed   I have personally obtained a history, examined the patient, evaluated laboratory and imaging results, formulated the assessment and plan and placed orders. CRITICAL CARE: The patient is critically ill with multiple organ systems failure and requires high complexity decision making for assessment and support, frequent evaluation and titration of therapies, application of advanced monitoring technologies and extensive interpretation of multiple databases. Critical Care Time devoted to patient care services described in this note is 60 minutes.   Overton Mam, MD Pulmonary and Critical Care Medicine [facility] Pager: [phone]  11/17/2012, 1:27 AM

## 2012-11-17 NOTE — Progress Notes (Signed)
Subjective: Patient remains intubated.  On Propofol.  Per report of nursing has been able to wake and follow some simple commands.  MRI and MRA of the brain reviewed and show no acute abnormalities.   Objective: Current vital signs: BP 130/87  Pulse 78  Temp(Src) 100 F (37.8 C) (Axillary)  Resp 18  Ht 6' (1.829 m)  Wt 129.5 kg (285 lb 7.9 oz)  BMI 38.71 kg/m2  SpO2 100% Vital signs in last 24 hours: Temp:  [95.7 F (35.4 C)-100.2 F (37.9 C)] 100 F (37.8 C) (05/10 1500) Pulse Rate:  [62-126] 78 (05/10 1606) Resp:  [18-44] 18 (05/10 1606) BP: (112-190)/(62-111) 130/87 mmHg (05/10 1606) SpO2:  [78 %-100 %] 100 % (05/10 1606) FiO2 (%):  [40 %-100 %] 40 % (05/10 1606) Weight:  [120 kg (264 lb 8.8 oz)-129.5 kg (285 lb 7.9 oz)] 129.5 kg (285 lb 7.9 oz) (05/10 0500)  Intake/Output from previous day: 05/09 0701 - 05/10 0700 In: 300 [I.V.:250; IV Piggyback:50] Out: 1850 [Urine:1850] Intake/Output this shift: Total I/O In: 1255.2 [I.V.:1205.2; IV Piggyback:50] Out: 1220 [Urine:1220] Nutritional status: NPO  Neurologic Exam: Mental Status: Patient does not respond to verbal stimuli.  Does not respond to deep sternal rub.  Does not follow commands.  No verbalizations noted.  Cranial Nerves: II: patient does not respond confrontation bilaterally, pupils right 1 mm, left 1 mm,and reactive bilaterally III,IV,VI: doll's response present bilaterally.  V,VII: corneal reflex present bilaterally  VIII: patient does not respond to verbal stimuli IX,X: gag reflex reduced, XI: trapezius strength unable to test bilaterally XII: tongue strength unable to test Motor: Extremities flaccid throughout.  No spontaneous movement noted.  No purposeful movements noted. Sensory: Does not respond to noxious stimuli in any extremity. Deep Tendon Reflexes:  1+ throughout with absent AJ's bilaterally Plantars: downgoing bilaterally Cerebellar: Unable to perform    Lab Results: Basic Metabolic  Panel:  Recent Labs Lab 11/16/12 2200 11/16/12 2233 11/17/12 0235 11/17/12 0659  NA 139 139  --  134*  K 3.7 3.8  --  4.3  CL 104 102  --  101  CO2 27  --   --  21  GLUCOSE 112* 107*  --  101*  BUN 6 5*  --  4*  CREATININE 0.71 0.60 0.67 0.60  CALCIUM 8.5  --   --  8.8  MG  --   --   --  1.6  PHOS  --   --   --  2.5    Liver Function Tests:  Recent Labs Lab 11/16/12 2200  AST 20  ALT 25  ALKPHOS 61  BILITOT 0.2*  PROT 6.2  ALBUMIN 3.4*   No results found for this basename: LIPASE, AMYLASE,  in the last 168 hours No results found for this basename: AMMONIA,  in the last 168 hours  CBC:  Recent Labs Lab 11/16/12 2200 11/16/12 2233 11/17/12 0235 11/17/12 0659  WBC 10.0  --  17.8* 20.5*  NEUTROABS 7.9*  --   --   --   HGB 14.0 15.3 15.4 15.5  HCT 42.2 45.0 46.5 47.6  MCV 85.1  --  85.0 86.1  PLT 139*  --  133* 149*    Cardiac Enzymes: No results found for this basename: CKTOTAL, CKMB, CKMBINDEX, TROPONINI,  in the last 168 hours  Lipid Panel: No results found for this basename: CHOL, TRIG, HDL, CHOLHDL, VLDL, LDLCALC,  in the last 168 hours  CBG:  Recent Labs Lab 11/16/12 2316  GLUCAP  148*    Microbiology: Results for orders placed during the hospital encounter of 11/16/12  MRSA PCR SCREENING     Status: None   Collection Time    11/17/12  2:33 AM      Result Value Range Status   MRSA by PCR NEGATIVE  NEGATIVE Final   Comment:            The GeneXpert MRSA Assay (FDA     approved for NASAL specimens     only), is one component of a     comprehensive MRSA colonization     surveillance program. It is not     intended to diagnose MRSA     infection nor to guide or     monitor treatment for     MRSA infections.    Coagulation Studies: No results found for this basename: LABPROT, INR,  in the last 72 hours  Imaging: Ct Head Wo Contrast  11/16/2012  *RADIOLOGY REPORT*  Clinical Data: Patient unresponsive.  CT HEAD WITHOUT CONTRAST   Technique:  Contiguous axial images were obtained from the base of the skull through the vertex without contrast.  Comparison: None.  Findings: No evidence of acute intracranial abnormality including infarction, hemorrhage, mass lesion, mass effect, midline shift or abnormal extra-axial fluid collection is identified.  There is no hydrocephalus or pneumocephalus.  The calvarium is intact.  Imaged paranasal sinuses and mastoid air cells are clear.  IMPRESSION: Negative exam.   Original Report Authenticated By: Holley Dexter, M.D.    Mr [facility] Wo Contrast  11/17/2012  *RADIOLOGY REPORT*  Clinical Data:  Altered mental status.  Slurred speech and confusion.  MRI HEAD WITHOUT AND WITH CONTRAST MRA HEAD WITHOUT CONTRAST  Technique:  Multiplanar, multiecho pulse sequences of the brain and surrounding structures were obtained without and with intravenous contrast.  Angiographic images of the head were obtained using MRA technique without contrast.  Contrast: 1 MULTIHANCE GADOBENATE DIMEGLUMINE 529 MG/ML IV SOLN  Comparison:  CT head without contrast 11/16/2012.  MRI HEAD WITHOUT AND WITH CONTRAST  Findings:  No acute intracranial abnormalities are present. Specifically, there is no evidence for acute infarct, hemorrhage, mass, hydrocephalus, or significant extra-axial fluid collection. Flow is present in the major intracranial arteries.  The patient is intubated.  The globes orbits are intact.  Mild mucosal thickening is evident in the paranasal sinuses.  No significant fluid levels are present.  There is some fluid in the mastoid air cells bilaterally, left greater than right.  Fluid is noted within the nasopharynx.  The postcontrast images demonstrate no pathologic enhancement.  IMPRESSION:  1.  Normal MRI appearance of brain without and with contrast. 2.  Mild sinus disease and fluid in the mastoid air cells is likely secondary to intubation and some layering nasopharyngeal fluid.  MRA HEAD  Findings: The  internal carotid arteries are within normal limits bilaterally from high cervical segments through the ICA termini. The A1 and M1 segments are normal.  The A2 segments are triplicated, a normal variant.  The MCA bifurcations are normal. There is some segmental irregularity of MCA branch vessels, left greater than right.  The right vertebral artery is dominant.  Mild irregularity of the vertebral arteries bilaterally is likely artifactual as there is significant patient motion.  The basilar artery is within normal limits.  Both posterior cerebral arteries originate from basilar tip.  There is some attenuation of the distal PCA branch vessels.  IMPRESSION:  1.  Mild distal small vessel disease  2.  No significant proximal stenosis, aneurysm, or branch vessel occlusion.   Original Report Authenticated By: Marin Roberts, M.D.    Mr [facility] Wo Contrast  11/17/2012  *RADIOLOGY REPORT*  Clinical Data:  Altered mental status.  Slurred speech and confusion.  MRI HEAD WITHOUT AND WITH CONTRAST MRA HEAD WITHOUT CONTRAST  Technique:  Multiplanar, multiecho pulse sequences of the brain and surrounding structures were obtained without and with intravenous contrast.  Angiographic images of the head were obtained using MRA technique without contrast.  Contrast: 1 MULTIHANCE GADOBENATE DIMEGLUMINE 529 MG/ML IV SOLN  Comparison:  CT head without contrast 11/16/2012.  MRI HEAD WITHOUT AND WITH CONTRAST  Findings:  No acute intracranial abnormalities are present. Specifically, there is no evidence for acute infarct, hemorrhage, mass, hydrocephalus, or significant extra-axial fluid collection. Flow is present in the major intracranial arteries.  The patient is intubated.  The globes orbits are intact.  Mild mucosal thickening is evident in the paranasal sinuses.  No significant fluid levels are present.  There is some fluid in the mastoid air cells bilaterally, left greater than right.  Fluid is noted within the nasopharynx.   The postcontrast images demonstrate no pathologic enhancement.  IMPRESSION:  1.  Normal MRI appearance of brain without and with contrast. 2.  Mild sinus disease and fluid in the mastoid air cells is likely secondary to intubation and some layering nasopharyngeal fluid.  MRA HEAD  Findings: The internal carotid arteries are within normal limits bilaterally from high cervical segments through the ICA termini. The A1 and M1 segments are normal.  The A2 segments are triplicated, a normal variant.  The MCA bifurcations are normal. There is some segmental irregularity of MCA branch vessels, left greater than right.  The right vertebral artery is dominant.  Mild irregularity of the vertebral arteries bilaterally is likely artifactual as there is significant patient motion.  The basilar artery is within normal limits.  Both posterior cerebral arteries originate from basilar tip.  There is some attenuation of the distal PCA branch vessels.  IMPRESSION:  1.  Mild distal small vessel disease  2.  No significant proximal stenosis, aneurysm, or branch vessel occlusion.   Original Report Authenticated By: Marin Roberts, M.D.    Dg Chest Port 1 View  11/17/2012  *RADIOLOGY REPORT*  Clinical Data: Respiratory failure  PORTABLE CHEST - 1 VIEW  Comparison: 11/17/2012  Findings: Endotracheal tube tip is 1.8 cm above the carina. Nasogastric tube enters the stomach with side port in the distal esophagus.  Low lung volumes are present, causing crowding of the pulmonary vasculature.  Heart size within normal limits for technique.  Right perihilar airspace opacity observed.  Mild left basilar medial airspace opacity noted.  IMPRESSION:  1.  Abnormal airspace opacity right perihilar region medial left basilar region.  This could be from aspiration pneumonitis, pneumonia, or less likely atelectasis. 2.  Nasogastric tube side port in the distal esophagus. 3.  Low lung volumes.   Original Report Authenticated By: Gaylyn Rong,  M.D.    Dg Chest Port 1 View  11/17/2012  *RADIOLOGY REPORT*  Clinical Data: Status post intubation.  PORTABLE CHEST - 1 VIEW  Comparison: Chest 11/16/2012.  Findings: Endotracheal tube is in place with the tip in good position at the level of the clavicular heads.  NG tube courses into the stomach and below the inferior margin of the film.  Lung volumes are low but the lungs appear clear.  Heart size is normal.  IMPRESSION: ET  tube in good position.  No acute finding in a low-volume chest.   Original Report Authenticated By: Holley Dexter, M.D.    Dg Chest Port 1 View  11/16/2012  *RADIOLOGY REPORT*  Clinical Data: Drug overdose.  PORTABLE CHEST - 1 VIEW  Comparison: Chest 06/18/2012.  Findings: Lung volumes are low with basilar atelectasis.  No pneumothorax or pleural effusion.  Heart size normal.  IMPRESSION: No acute finding in a low-volume chest.   Original Report Authenticated By: Holley Dexter, M.D.     Medications:  I have reviewed the patient's current medications. Scheduled: . antiseptic oral rinse  15 mL Mouth Rinse QID  . chlorhexidine  15 mL Mouth Rinse BID  . fentaNYL      . heparin  5,000 Units Subcutaneous Q8H  . pantoprazole (PROTONIX) IV  40 mg Intravenous QHS  . piperacillin-tazobactam (ZOSYN)  IV  3.375 g Intravenous Q8H  . vecuronium        Assessment/Plan: 42 year old male with altered mental status.  No evidence of an intracerebral process.  Initially white blood cell count was normal but today is elevated likely related to aspiration pneumonia.  Concerned that this may be medication related with patient stating a new medication on the same day of the event and being on Clozaril chronically.  Patient currently off all psychotropics and showing some signs of improvement.  Recommendations: 1.  Would continue off psychotropics 2.  EEG   LOS: 1 day   Thana Farr, MD [facility] [phone] 11/17/2012  4:16 PM

## 2012-11-17 NOTE — ED Notes (Signed)
Anesthesia paged

## 2012-11-17 NOTE — ED Notes (Signed)
ICU MD Using bag valve mark to ventilate pt

## 2012-11-17 NOTE — Progress Notes (Signed)
Arrived to pts room and noticed pt was in respiratory distress. Sats dropped. MD called. Dr. Vassie Loll to bedside for extubation and re-intubation. Sats increased post intubation. Will continue to monitor.

## 2012-11-17 NOTE — ED Notes (Signed)
Anesthesia 2nd attempt unsuccessful, bagging pt

## 2012-11-17 NOTE — ED Notes (Signed)
PT continues to have snoring respirations and is not responding to sternal rub or nailbed pressure. Family remains at bedside

## 2012-11-17 NOTE — ED Notes (Signed)
Pt changed from BVM to ventilator via RT. ICU MD remains at bedside

## 2012-11-17 NOTE — ED Notes (Signed)
3rd attempt unsuccessful, bagging pt x24min.

## 2012-11-17 NOTE — ED Notes (Signed)
Second attempt unsuccessful, bagging pt x1 min

## 2012-11-17 NOTE — Anesthesia Preprocedure Evaluation (Addendum)
Anesthesia Evaluation  Patient identified by MRN, date of birth, ID band Patient unresponsive    Airway       Dental   Pulmonary          Cardiovascular     Neuro/Psych    GI/Hepatic   Endo/Other    Renal/GU      Musculoskeletal   Abdominal   Peds  Hematology   Anesthesia Other Findings   Reproductive/Obstetrics                           Anesthesia Physical Anesthesia Plan  ASA: IV and emergent  Anesthesia Plan:    Post-op Pain Management:    Induction:   Airway Management Planned:   Additional Equipment:   Intra-op Plan:   Post-operative Plan:   Informed Consent:   Plan Discussed with:   Anesthesia Plan Comments:         Anesthesia Quick Evaluation

## 2012-11-17 NOTE — Progress Notes (Signed)
ANTIBIOTIC CONSULT NOTE - INITIAL  Pharmacy Consult for Zosyn Indication: Aspiration event   No Known Allergies  Vital Signs: Temp: 95.7 F (35.4 C) (05/10 0022) Temp src: Rectal (05/10 0022) BP: 179/94 mmHg (05/10 0055) Pulse Rate: 124 (05/10 0055) Intake/Output from previous day:   Intake/Output from this shift:    Labs:  Recent Labs  11/16/12 2200 11/16/12 2233  WBC 10.0  --   HGB 14.0 15.3  PLT 139*  --   CREATININE 0.71 0.60   The CrCl is unknown because both a height and weight (above a minimum accepted value) are required for this calculation. No results found for this basename: VANCOTROUGH, VANCOPEAK, VANCORANDOM, GENTTROUGH, GENTPEAK, GENTRANDOM, TOBRATROUGH, TOBRAPEAK, TOBRARND, AMIKACINPEAK, AMIKACINTROU, AMIKACIN,  in the last 72 hours   Microbiology: No results found for this or any previous visit (from the past 720 hour(s)).  Medical History: Past Medical History  Diagnosis Date  . Schizophrenia    Assessment: 41 YOM brought to [facility] by EMS after being found unresponsive with laborious breathing in back seat of car parked in his drive way. Pharmacy is consulted to dose zosyn empirically for aspiration event. Scr = 0.6, est. crcl > 90 ml/min.   Goal of Therapy:  Appropriate antibiotic dosing  Plan:  - Zosyn 3.375 g IV Q 8 hrs (4 hr infusion)  Bayard Hugger, PharmD, BCPS  Clinical Pharmacist  Pager: [phone]   11/17/2012,1:29 AM

## 2012-11-17 NOTE — Progress Notes (Signed)
Pt taken to MRI at this time. Pt bagged by RT and then placed on MRI vent with current unit settings. No complications noted. Airway remained intact. RT will continue to monitor.

## 2012-11-17 NOTE — ED Notes (Signed)
Pt moving hands towards abdomen intermittently. ICU MD paged

## 2012-11-17 NOTE — Progress Notes (Signed)
Arrived to pts room this am, pt being bagged by night shift RT, spo2 80's, Dr.Alva called to bedside and pt reintubated by MD, +Etco2 color change, =BBS, good return volumes on vent, spo2 100%, will monitor

## 2012-11-17 NOTE — ED Notes (Signed)
PT continuing to move arms intermittently.

## 2012-11-17 NOTE — ED Notes (Signed)
Pt vomiting brown emesis, yankaur suction used, airway intact. Pt being bagged

## 2012-11-17 NOTE — ED Notes (Signed)
4th attempt unsuccessful. Continuing to bag pt

## 2012-11-17 NOTE — Consult Note (Signed)
NEURO HOSPITALIST CONSULT NOTE    Reason for Consult: unresponsiveness.  HPI:                                                                                                                                          [name] [name] is an 42 y.o. male with a past medical history significant for hypertension, schizophrenia, crack cocaine use, brought to [facility] by EMS after being found unresponsive with laborious breathing  in back seat of car parked in his drive way. Exact time is unclear, but apparently this occurred about 8 pm last night. Narcan given by EMS and also upon arrival to the ED but remains unresponsive. As per ED staff, he was not responding to painful stimuli and had pinpoint and non reactive pupils. CT brain and urine toxic screen unremarkable.  According to patient's family, he was just started on abilify and became confused and less responsive after receiving a dose today. No other history available at this time. Presently, patient being paralyzed and intubated.  Past Medical History  Diagnosis Date  . Schizophrenia     History reviewed. No pertinent past surgical history.  No family history on file.  Family History:unknown.   Social History:  reports that he has been smoking Cigarettes.  He has been smoking about 1.00 pack per day. He does not have any smokeless tobacco history on file. He reports that he does not drink alcohol or use illicit drugs.  No Known Allergies  MEDICATIONS:                                                                                                                     I have reviewed the patient's current medications.   ROS: unable to obtain due to patient's comatose state.  History obtained from chart review     Physical exam: unresponsive and intubated. Blood pressure 179/94, pulse 124, temperature 95.7 F (35.4 C),  temperature source Rectal, resp. rate 21, SpO2 99.00%. Head: normocephalic. Neck: supple, no bruits, no JVD. Cardiac: no murmurs. Lungs: clear. Abdomen: soft, no tender, no mass. Extremities: no edema.   Neurologic Examination:                                                                                                      Limited exam as patient is paralyzed and intubated. Mental status: unresponsive. CN 2-12: pinpoint pupils no reactive to light. No gaze preference. Corneal reflexes unreliable. No frank facial asymmetry. Motor: paralyzed. Sensory: unreliable. DTR's: paralyzed. Coordination and gait: no tested. Plantars: downgoing.  Lab Results  Component Value Date/Time   CHOL 166 12/27/2007  8:27 PM    Results for orders placed during the hospital encounter of 11/16/12 (from the past 48 hour(s))  ACETAMINOPHEN LEVEL     Status: None   Collection Time    11/16/12 10:00 PM      Result Value Range   Acetaminophen (Tylenol), Serum <15.0  10 - 30 ug/mL   Comment:            THERAPEUTIC CONCENTRATIONS VARY     SIGNIFICANTLY. A RANGE OF 10-30     ug/mL MAY BE AN EFFECTIVE     CONCENTRATION FOR MANY PATIENTS.     HOWEVER, SOME ARE BEST TREATED     AT CONCENTRATIONS OUTSIDE THIS     RANGE.     ACETAMINOPHEN CONCENTRATIONS     >150 ug/mL AT 4 HOURS AFTER     INGESTION AND >50 ug/mL AT 12     HOURS AFTER INGESTION ARE     OFTEN ASSOCIATED WITH TOXIC     REACTIONS.  CBC WITH DIFFERENTIAL     Status: Abnormal   Collection Time    11/16/12 10:00 PM      Result Value Range   WBC 10.0  4.0 - 10.5 K/uL   RBC 4.96  4.22 - 5.81 MIL/uL   Hemoglobin 14.0  13.0 - 17.0 g/dL   HCT 16.1  09.6 - 04.5 %   MCV 85.1  78.0 - 100.0 fL   MCH 28.2  26.0 - 34.0 pg   MCHC 33.2  30.0 - 36.0 g/dL   RDW 40.9  81.1 - 91.4 %   Platelets 139 (*) 150 - 400 K/uL   Neutrophils Relative 79 (*) 43 - 77 %   Neutro Abs 7.9 (*) 1.7 - 7.7 K/uL   Lymphocytes Relative 13  12 - 46 %   Lymphs Abs 1.3   0.7 - 4.0 K/uL   Monocytes Relative 8  3 - 12 %   Monocytes Absolute 0.8  0.1 - 1.0 K/uL   Eosinophils Relative 0  0 - 5 %   Eosinophils Absolute 0.0  0.0 - 0.7 K/uL   Basophils Relative 0  0 - 1 %   Basophils Absolute 0.0  0.0 - 0.1 K/uL  COMPREHENSIVE METABOLIC PANEL  Status: Abnormal   Collection Time    11/16/12 10:00 PM      Result Value Range   Sodium 139  135 - 145 mEq/L   Potassium 3.7  3.5 - 5.1 mEq/L   Chloride 104  96 - 112 mEq/L   CO2 27  19 - 32 mEq/L   Glucose, Bld 112 (*) 70 - 99 mg/dL   BUN 6  6 - 23 mg/dL   Creatinine, Ser 1.61  0.50 - 1.35 mg/dL   Calcium 8.5  8.4 - 09.6 mg/dL   Total Protein 6.2  6.0 - 8.3 g/dL   Albumin 3.4 (*) 3.5 - 5.2 g/dL   AST 20  0 - 37 U/L   ALT 25  0 - 53 U/L   Alkaline Phosphatase 61  39 - 117 U/L   Total Bilirubin 0.2 (*) 0.3 - 1.2 mg/dL   GFR calc non Af Amer >90  >90 mL/min   GFR calc Af Amer >90  >90 mL/min   Comment:            The eGFR has been calculated     using the CKD EPI equation.     This calculation has not been     validated in all clinical     situations.     eGFR's persistently     <90 mL/min signify     possible Chronic Kidney Disease.  ETHANOL     Status: None   Collection Time    11/16/12 10:00 PM      Result Value Range   Alcohol, Ethyl (B) <11  0 - 11 mg/dL   Comment:            LOWEST DETECTABLE LIMIT FOR     SERUM ALCOHOL IS 11 mg/dL     FOR MEDICAL PURPOSES ONLY  SALICYLATE LEVEL     Status: Abnormal   Collection Time    11/16/12 10:00 PM      Result Value Range   Salicylate Lvl <2.0 (*) 2.8 - 20.0 mg/dL  POCT I-STAT 3, BLOOD GAS (G3+)     Status: Abnormal   Collection Time    11/16/12 10:21 PM      Result Value Range   pH, Arterial 7.328 (*) 7.350 - 7.450   pCO2 arterial 47.1 (*) 35.0 - 45.0 mmHg   pO2, Arterial 74.0 (*) 80.0 - 100.0 mmHg   Bicarbonate 24.7 (*) 20.0 - 24.0 mEq/L   TCO2 26  0 - 100 mmol/L   O2 Saturation 93.0     Acid-base deficit 2.0  0.0 - 2.0 mmol/L   Patient  temperature 98.6 F     Collection site RADIAL, ALLEN'S TEST ACCEPTABLE     Drawn by RT     Sample type ARTERIAL    POCT I-STAT, CHEM 8     Status: Abnormal   Collection Time    11/16/12 10:33 PM      Result Value Range   Sodium 139  135 - 145 mEq/L   Potassium 3.8  3.5 - 5.1 mEq/L   Chloride 102  96 - 112 mEq/L   BUN 5 (*) 6 - 23 mg/dL   Creatinine, Ser 0.45  0.50 - 1.35 mg/dL   Glucose, Bld 409 (*) 70 - 99 mg/dL   Calcium, Ion 8.11  9.14 - 1.23 mmol/L   TCO2 29  0 - 100 mmol/L   Hemoglobin 15.3  13.0 - 17.0 g/dL   HCT 78.2  95.6 -  52.0 %  URINALYSIS, ROUTINE W REFLEX MICROSCOPIC     Status: None   Collection Time    11/16/12 11:07 PM      Result Value Range   Color, Urine YELLOW  YELLOW   APPearance CLEAR  CLEAR   Specific Gravity, Urine 1.010  1.005 - 1.030   pH 6.0  5.0 - 8.0   Glucose, UA NEGATIVE  NEGATIVE mg/dL   Hgb urine dipstick NEGATIVE  NEGATIVE   Bilirubin Urine NEGATIVE  NEGATIVE   Ketones, ur NEGATIVE  NEGATIVE mg/dL   Protein, ur NEGATIVE  NEGATIVE mg/dL   Urobilinogen, UA 0.2  0.0 - 1.0 mg/dL   Nitrite NEGATIVE  NEGATIVE   Leukocytes, UA NEGATIVE  NEGATIVE   Comment: MICROSCOPIC NOT DONE ON URINES WITH NEGATIVE PROTEIN, BLOOD, LEUKOCYTES, NITRITE, OR GLUCOSE <1000 mg/dL.  URINE RAPID DRUG SCREEN (HOSP PERFORMED)     Status: None   Collection Time    11/16/12 11:08 PM      Result Value Range   Opiates NONE DETECTED  NONE DETECTED   Cocaine NONE DETECTED  NONE DETECTED   Benzodiazepines NONE DETECTED  NONE DETECTED   Amphetamines NONE DETECTED  NONE DETECTED   Tetrahydrocannabinol NONE DETECTED  NONE DETECTED   Barbiturates NONE DETECTED  NONE DETECTED   Comment:            DRUG SCREEN FOR MEDICAL PURPOSES     ONLY.  IF CONFIRMATION IS NEEDED     FOR ANY PURPOSE, NOTIFY LAB     WITHIN 5 DAYS.                LOWEST DETECTABLE LIMITS     FOR URINE DRUG SCREEN     Drug Class       Cutoff (ng/mL)     Amphetamine      1000     Barbiturate      200      Benzodiazepine   200     Tricyclics       300     Opiates          300     Cocaine          300     THC              50  GLUCOSE, CAPILLARY     Status: Abnormal   Collection Time    11/16/12 11:16 PM      Result Value Range   Glucose-Capillary 148 (*) 70 - 99 mg/dL   Comment 1 Documented in Chart     Comment 2 Notify RN      Ct Head Wo Contrast  11/16/2012  *RADIOLOGY REPORT*  Clinical Data: Patient unresponsive.  CT HEAD WITHOUT CONTRAST  Technique:  Contiguous axial images were obtained from the base of the skull through the vertex without contrast.  Comparison: None.  Findings: No evidence of acute intracranial abnormality including infarction, hemorrhage, mass lesion, mass effect, midline shift or abnormal extra-axial fluid collection is identified.  There is no hydrocephalus or pneumocephalus.  The calvarium is intact.  Imaged paranasal sinuses and mastoid air cells are clear.  IMPRESSION: Negative exam.   Original Report Authenticated By: Holley Dexter, M.D.    Dg Chest Port 1 View  11/16/2012  *RADIOLOGY REPORT*  Clinical Data: Drug overdose.  PORTABLE CHEST - 1 VIEW  Comparison: Chest 06/18/2012.  Findings: Lung volumes are low with basilar atelectasis.  No pneumothorax or pleural effusion.  Heart  size normal.  IMPRESSION: No acute finding in a low-volume chest.   Original Report Authenticated By: Holley Dexter, M.D.      Assessment/Plan: 42 years old with hypertension, cocaine use, and schizophrenia, brought to ED comatose with pinpoint and non reactive pupils. Started on abilify today but apparently hasn't been using cocaine lately. No response to narcan.  Acute presentation and findings on neurological exam are also concerning for an acute vascular event involving the pons.  Needs MRI/MRA brain as soon as his clinical condition allows. Will follow up.   Wyatt Portela, MD [facility] [phone]  11/17/2012, 1:06 AM

## 2012-11-18 DIAGNOSIS — G9340 Encephalopathy, unspecified: Principal | ICD-10-CM

## 2012-11-18 LAB — CBC
MCV: 84.8 fL (ref 78.0–100.0)
Platelets: 147 10*3/uL — ABNORMAL LOW (ref 150–400)
RBC: 5.08 MIL/uL (ref 4.22–5.81)
WBC: 9.8 10*3/uL (ref 4.0–10.5)

## 2012-11-18 LAB — BASIC METABOLIC PANEL
CO2: 25 mEq/L (ref 19–32)
Calcium: 8.5 mg/dL (ref 8.4–10.5)
Sodium: 141 mEq/L (ref 135–145)

## 2012-11-18 MED ORDER — POTASSIUM CHLORIDE 10 MEQ/100ML IV SOLN
10.0000 meq | INTRAVENOUS | Status: AC
Start: 2012-11-18 — End: 2012-11-18
  Administered 2012-11-18 (×2): 10 meq via INTRAVENOUS
  Filled 2012-11-18: qty 200

## 2012-11-18 MED ORDER — CLOZAPINE 100 MG PO TABS
100.0000 mg | ORAL_TABLET | Freq: Every day | ORAL | Status: DC
  Administered 2012-11-18 – 2012-11-19 (×2): 100 mg via ORAL
  Filled 2012-11-18 (×2): qty 1

## 2012-11-18 MED ORDER — BENZTROPINE MESYLATE 1 MG PO TABS
1.0000 mg | ORAL_TABLET | Freq: Two times a day (BID) | ORAL | Status: DC
  Administered 2012-11-18 – 2012-11-19 (×3): 1 mg via ORAL
  Filled 2012-11-18 (×6): qty 1

## 2012-11-18 NOTE — Progress Notes (Signed)
PULMONARY  / CRITICAL CARE MEDICINE  Name: [name] [name] MRN: [id number] DOB: 02-29-1971    ADMISSION DATE:  11/16/2012   PRIMARY SERVICE: PCCM  CHIEF COMPLAINT:  Altered mental status  BRIEF PATIENT DESCRIPTION:  42 years old male with PMH relevant for schizophrenia and HTN. Presents after becoming unresponsive. Intubated for airway protection.  SIGNIFICANT EVENTS / STUDIES:  - Aspiration of gastric contents during intubation -Reintubated 5/11  LINES / TUBES: - Peripheral lines  CULTURES: resp 5/9 >>  ANTIBIOTICS: - Zosyn 5/9 >>  HISTORY OF PRESENT ILLNESS:   42 years old male with PMH relevant for schizophrenia, HTN and also history of crack abuse. As per his sister, he was doing well this morning, today had a shot of Abilify 400 mg IM, and then took his usual medications including clozapine and benztropine. At about 8 pm he started having confusion and slurred speech and the became unresponsive. At arrival to the ED the patient was unresponsive to verbal and tactile stimuli, breathing spontaneously, hemodynamically stable. CT scan of the head was unremarkable. Urine toxicology screen negative. We decided to intubate for airway protection. During intubation he vomited and aspirated. He remained hemodynamically stable. Neurology at bedside recommended MRI of the brain.   SUBJECTIVE:  Afebrile Good mental status Good Tv on PS 5/5  VITAL SIGNS: Temp:  [96.3 F (35.7 C)-100.2 F (37.9 C)] 97.7 F (36.5 C) (05/11 0400) Pulse Rate:  [60-93] 67 (05/11 0823) Resp:  [18-19] 18 (05/11 0823) BP: (108-147)/(67-98) 147/98 mmHg (05/11 0823) SpO2:  [99 %-100 %] 100 % (05/11 0823) FiO2 (%):  [40 %-100 %] 40 % (05/11 0823) Weight:  [126.9 kg (279 lb 12.2 oz)] 126.9 kg (279 lb 12.2 oz) (05/11 0500) HEMODYNAMICS:   VENTILATOR SETTINGS: Vent Mode:  [-] PSV;CPAP FiO2 (%):  [40 %-100 %] 40 % Set Rate:  [18 bmp] 18 bmp Vt Set:  [620 mL] 620 mL PEEP:  [5 cmH20] 5 cmH20 Pressure Support:   [5 cmH20] 5 cmH20 Plateau Pressure:  [11 cmH20-18 cmH20] 18 cmH20 INTAKE / OUTPUT: Intake/Output     05/10 0701 - 05/11 0700 05/11 0701 - 05/12 0700   I.V. (mL/kg) 3539.7 (27.9) 125 (1)   Other  350   IV Piggyback 250 100   Total Intake(mL/kg) 3789.7 (29.9) 575 (4.5)   Urine (mL/kg/hr) 1780 (0.6)    Emesis/NG output 1000 (0.3)    Total Output 2780     Net +1009.7 +575          PHYSICAL EXAMINATION: General: responsive, intubated. Eyes: Anicteric sclerae. ENT:  Dry mucous membranes. No thrush Lymph: No cervical, supraclavicular, or axillary lymphadenopathy. Heart: Normal S1, S2. No murmurs, rubs, or gallops appreciated. No bruits, equal pulses. Lungs: Normal excursion. Good air movement bilaterally, bilateral diffuse crackles after aspiration event.  Abdomen: Abdomen soft, non-tender and not distended, normoactive bowel sounds. No hepatosplenomegaly or masses. Musculoskeletal: No clubbing or synovitis. Skin: No rashes or lesions Neuro: Breathing spontaneously on 5/5, non focal.  LABS:  Recent Labs Lab 11/16/12 2200 11/16/12 2221 11/16/12 2233 11/17/12 0235 11/17/12 0659 11/17/12 0830 11/18/12 0402  HGB 14.0  --  15.3 15.4 15.5  --  14.2  WBC 10.0  --   --  17.8* 20.5*  --  9.8  PLT 139*  --   --  133* 149*  --  147*  NA 139  --  139  --  134*  --  141  K 3.7  --  3.8  --  4.3  --  3.3*  CL 104  --  102  --  101  --  107  CO2 27  --   --   --  21  --  25  GLUCOSE 112*  --  107*  --  101*  --  89  BUN 6  --  5*  --  4*  --  6  CREATININE 0.71  --  0.60 0.67 0.60  --  0.96  CALCIUM 8.5  --   --   --  8.8  --  8.5  MG  --   --   --   --  1.6  --   --   PHOS  --   --   --   --  2.5  --   --   AST 20  --   --   --   --   --   --   ALT 25  --   --   --   --   --   --   ALKPHOS 61  --   --   --   --   --   --   BILITOT 0.2*  --   --   --   --   --   --   PROT 6.2  --   --   --   --   --   --   ALBUMIN 3.4*  --   --   --   --   --   --   PHART  --  7.328*  --   --    --  7.427  --   PCO2ART  --  47.1*  --   --   --  39.1  --   PO2ART  --  74.0*  --   --   --  118.0*  --     Recent Labs Lab 11/16/12 2316  GLUCAP 148*    CXR: 5/10 ETT in position  ASSESSMENT / PLAN:  PULMONARY A: 1) Respiratory failure due to inability to protect his airway 2) Aspiration event during intubation P:   - Extubate - Zosyn for aspiration, chk resp cx   CARDIOVASCULAR A:  1) Hemodynamically stable P:  - watch BP  RENAL A:   1) Hypokalemia P:   - repelte  GASTROINTESTINAL A:   1) Aspiration P:   Start diet   HEMATOLOGIC A:   1) No issues P:  - Will follow CBC in am  INFECTIOUS A:   1) Aspiration event P:   - Zosyn  ENDOCRINE A:   1) can dc CBG checks once eating NEUROLOGIC A:   1) Altered mental status with neg imaging. In the differential are antipsychotics interactions,  less likely drug overdose given negative urinary toxicology screen. He does state -took 4 pills P:   - Neurology input appreciated - dc sedation  _Need clarification from sister as to which pills he took -Will restart clozapine & 1/2 dose cogentin in meantime to avoid worsening psychotic symptoms   I have personally obtained a history, examined the patient, evaluated laboratory and imaging results, formulated the assessment and plan and placed orders. CRITICAL CARE: The patient is critically ill with multiple organ systems failure and requires high complexity decision making for assessment and support, frequent evaluation and titration of therapies, application of advanced monitoring technologies and extensive interpretation of multiple databases. Critical Care Time devoted to patient care services described in this note is 31 minutes.  Cyril Mourning MD. Tonny Bollman. [facility] Pager [phone] If no response call [phone]    11/18/2012, 8:55 AM

## 2012-11-18 NOTE — Progress Notes (Signed)
Subjective: Patient improved today.  Extubated.  Awake and alert.    Objective: Current vital signs: BP 147/98  Pulse 67  Temp(Src) 97.7 F (36.5 C) (Axillary)  Resp 18  Ht 6' (1.829 m)  Wt 126.9 kg (279 lb 12.2 oz)  BMI 37.93 kg/m2  SpO2 100% Vital signs in last 24 hours: Temp:  [96.3 F (35.7 C)-100.2 F (37.9 C)] 97.7 F (36.5 C) (05/11 0400) Pulse Rate:  [60-86] 67 (05/11 0823) Resp:  [18-19] 18 (05/11 0823) BP: (108-147)/(67-98) 147/98 mmHg (05/11 0823) SpO2:  [99 %-100 %] 100 % (05/11 0823) FiO2 (%):  [40 %-50 %] 40 % (05/11 0823) Weight:  [126.9 kg (279 lb 12.2 oz)] 126.9 kg (279 lb 12.2 oz) (05/11 0500)  Intake/Output from previous day: 05/10 0701 - 05/11 0700 In: 3789.7 [I.V.:3539.7; IV Piggyback:250] Out: 2780 [Urine:1780; Emesis/NG output:1000] Intake/Output this shift: Total I/O In: 575 [I.V.:125; Other:350; IV Piggyback:100] Out: -  Nutritional status: Cardiac  Neurologic Exam: Mental Status: Alert, oriented, thought content appropriate.  Speech fluent without evidence of aphasia but hoarse.  Able to follow 3 step commands without difficulty. Cranial Nerves: II: Discs flat bilaterally; Visual fields grossly normal, pupils equal at 3mm, round, reactive to light and accommodation III,IV, VI: ptosis not present, extra-ocular motions intact bilaterally V,VII: smile symmetric, facial light touch sensation normal bilaterally VIII: hearing normal bilaterally XI: bilateral shoulder shrug XII: midline tongue extension Motor: Moves all extremities symmetrically   Lab Results: Basic Metabolic Panel:  Recent Labs Lab 11/16/12 2200 11/16/12 2233 11/17/12 0235 11/17/12 0659 11/18/12 0402  NA 139 139  --  134* 141  K 3.7 3.8  --  4.3 3.3*  CL 104 102  --  101 107  CO2 27  --   --  21 25  GLUCOSE 112* 107*  --  101* 89  BUN 6 5*  --  4* 6  CREATININE 0.71 0.60 0.67 0.60 0.96  CALCIUM 8.5  --   --  8.8 8.5  MG  --   --   --  1.6  --   PHOS  --   --    --  2.5  --     Liver Function Tests:  Recent Labs Lab 11/16/12 2200  AST 20  ALT 25  ALKPHOS 61  BILITOT 0.2*  PROT 6.2  ALBUMIN 3.4*   No results found for this basename: LIPASE, AMYLASE,  in the last 168 hours No results found for this basename: AMMONIA,  in the last 168 hours  CBC:  Recent Labs Lab 11/16/12 2200 11/16/12 2233 11/17/12 0235 11/17/12 0659 11/18/12 0402  WBC 10.0  --  17.8* 20.5* 9.8  NEUTROABS 7.9*  --   --   --   --   HGB 14.0 15.3 15.4 15.5 14.2  HCT 42.2 45.0 46.5 47.6 43.1  MCV 85.1  --  85.0 86.1 84.8  PLT 139*  --  133* 149* 147*    Cardiac Enzymes: No results found for this basename: CKTOTAL, CKMB, CKMBINDEX, TROPONINI,  in the last 168 hours  Lipid Panel: No results found for this basename: CHOL, TRIG, HDL, CHOLHDL, VLDL, LDLCALC,  in the last 168 hours  CBG:  Recent Labs Lab 11/16/12 2316  GLUCAP 148*    Microbiology: Results for orders placed during the hospital encounter of 11/16/12  MRSA PCR SCREENING     Status: None   Collection Time    11/17/12  2:33 AM      Result Value Range  Status   MRSA by PCR NEGATIVE  NEGATIVE Final   Comment:            The GeneXpert MRSA Assay (FDA     approved for NASAL specimens     only), is one component of a     comprehensive MRSA colonization     surveillance program. It is not     intended to diagnose MRSA     infection nor to guide or     monitor treatment for     MRSA infections.    Coagulation Studies: No results found for this basename: LABPROT, INR,  in the last 72 hours  Imaging: Ct Head Wo Contrast  11/16/2012  *RADIOLOGY REPORT*  Clinical Data: Patient unresponsive.  CT HEAD WITHOUT CONTRAST  Technique:  Contiguous axial images were obtained from the base of the skull through the vertex without contrast.  Comparison: None.  Findings: No evidence of acute intracranial abnormality including infarction, hemorrhage, mass lesion, mass effect, midline shift or abnormal  extra-axial fluid collection is identified.  There is no hydrocephalus or pneumocephalus.  The calvarium is intact.  Imaged paranasal sinuses and mastoid air cells are clear.  IMPRESSION: Negative exam.   Original Report Authenticated By: Holley Dexter, M.D.    Mr [facility] Wo Contrast  11/17/2012  *RADIOLOGY REPORT*  Clinical Data:  Altered mental status.  Slurred speech and confusion.  MRI HEAD WITHOUT AND WITH CONTRAST MRA HEAD WITHOUT CONTRAST  Technique:  Multiplanar, multiecho pulse sequences of the brain and surrounding structures were obtained without and with intravenous contrast.  Angiographic images of the head were obtained using MRA technique without contrast.  Contrast: 1 MULTIHANCE GADOBENATE DIMEGLUMINE 529 MG/ML IV SOLN  Comparison:  CT head without contrast 11/16/2012.  MRI HEAD WITHOUT AND WITH CONTRAST  Findings:  No acute intracranial abnormalities are present. Specifically, there is no evidence for acute infarct, hemorrhage, mass, hydrocephalus, or significant extra-axial fluid collection. Flow is present in the major intracranial arteries.  The patient is intubated.  The globes orbits are intact.  Mild mucosal thickening is evident in the paranasal sinuses.  No significant fluid levels are present.  There is some fluid in the mastoid air cells bilaterally, left greater than right.  Fluid is noted within the nasopharynx.  The postcontrast images demonstrate no pathologic enhancement.  IMPRESSION:  1.  Normal MRI appearance of brain without and with contrast. 2.  Mild sinus disease and fluid in the mastoid air cells is likely secondary to intubation and some layering nasopharyngeal fluid.  MRA HEAD  Findings: The internal carotid arteries are within normal limits bilaterally from high cervical segments through the ICA termini. The A1 and M1 segments are normal.  The A2 segments are triplicated, a normal variant.  The MCA bifurcations are normal. There is some segmental irregularity of MCA  branch vessels, left greater than right.  The right vertebral artery is dominant.  Mild irregularity of the vertebral arteries bilaterally is likely artifactual as there is significant patient motion.  The basilar artery is within normal limits.  Both posterior cerebral arteries originate from basilar tip.  There is some attenuation of the distal PCA branch vessels.  IMPRESSION:  1.  Mild distal small vessel disease  2.  No significant proximal stenosis, aneurysm, or branch vessel occlusion.   Original Report Authenticated By: Marin Roberts, M.D.    Mr [facility] Wo Contrast  11/17/2012  *RADIOLOGY REPORT*  Clinical Data:  Altered mental status.  Slurred speech  and confusion.  MRI HEAD WITHOUT AND WITH CONTRAST MRA HEAD WITHOUT CONTRAST  Technique:  Multiplanar, multiecho pulse sequences of the brain and surrounding structures were obtained without and with intravenous contrast.  Angiographic images of the head were obtained using MRA technique without contrast.  Contrast: 1 MULTIHANCE GADOBENATE DIMEGLUMINE 529 MG/ML IV SOLN  Comparison:  CT head without contrast 11/16/2012.  MRI HEAD WITHOUT AND WITH CONTRAST  Findings:  No acute intracranial abnormalities are present. Specifically, there is no evidence for acute infarct, hemorrhage, mass, hydrocephalus, or significant extra-axial fluid collection. Flow is present in the major intracranial arteries.  The patient is intubated.  The globes orbits are intact.  Mild mucosal thickening is evident in the paranasal sinuses.  No significant fluid levels are present.  There is some fluid in the mastoid air cells bilaterally, left greater than right.  Fluid is noted within the nasopharynx.  The postcontrast images demonstrate no pathologic enhancement.  IMPRESSION:  1.  Normal MRI appearance of brain without and with contrast. 2.  Mild sinus disease and fluid in the mastoid air cells is likely secondary to intubation and some layering nasopharyngeal fluid.  MRA HEAD   Findings: The internal carotid arteries are within normal limits bilaterally from high cervical segments through the ICA termini. The A1 and M1 segments are normal.  The A2 segments are triplicated, a normal variant.  The MCA bifurcations are normal. There is some segmental irregularity of MCA branch vessels, left greater than right.  The right vertebral artery is dominant.  Mild irregularity of the vertebral arteries bilaterally is likely artifactual as there is significant patient motion.  The basilar artery is within normal limits.  Both posterior cerebral arteries originate from basilar tip.  There is some attenuation of the distal PCA branch vessels.  IMPRESSION:  1.  Mild distal small vessel disease  2.  No significant proximal stenosis, aneurysm, or branch vessel occlusion.   Original Report Authenticated By: Marin Roberts, M.D.    Dg Chest Port 1 View  11/17/2012  *RADIOLOGY REPORT*  Clinical Data: Respiratory failure  PORTABLE CHEST - 1 VIEW  Comparison: 11/17/2012  Findings: Endotracheal tube tip is 1.8 cm above the carina. Nasogastric tube enters the stomach with side port in the distal esophagus.  Low lung volumes are present, causing crowding of the pulmonary vasculature.  Heart size within normal limits for technique.  Right perihilar airspace opacity observed.  Mild left basilar medial airspace opacity noted.  IMPRESSION:  1.  Abnormal airspace opacity right perihilar region medial left basilar region.  This could be from aspiration pneumonitis, pneumonia, or less likely atelectasis. 2.  Nasogastric tube side port in the distal esophagus. 3.  Low lung volumes.   Original Report Authenticated By: Gaylyn Rong, M.D.    Dg Chest Port 1 View  11/17/2012  *RADIOLOGY REPORT*  Clinical Data: Status post intubation.  PORTABLE CHEST - 1 VIEW  Comparison: Chest 11/16/2012.  Findings: Endotracheal tube is in place with the tip in good position at the level of the clavicular heads.  NG tube  courses into the stomach and below the inferior margin of the film.  Lung volumes are low but the lungs appear clear.  Heart size is normal.  IMPRESSION: ET tube in good position.  No acute finding in a low-volume chest.   Original Report Authenticated By: Holley Dexter, M.D.    Dg Chest Port 1 View  11/16/2012  *RADIOLOGY REPORT*  Clinical Data: Drug overdose.  PORTABLE CHEST -  1 VIEW  Comparison: Chest 06/18/2012.  Findings: Lung volumes are low with basilar atelectasis.  No pneumothorax or pleural effusion.  Heart size normal.  IMPRESSION: No acute finding in a low-volume chest.   Original Report Authenticated By: Holley Dexter, M.D.     Medications:  I have reviewed the patient's current medications. Scheduled: . benztropine  1 mg Oral BID  . cloZAPine  100 mg Oral Daily  . heparin  5,000 Units Subcutaneous Q8H  . piperacillin-tazobactam (ZOSYN)  IV  3.375 g Intravenous Q8H  . potassium chloride  10 mEq Intravenous Q1 Hr x 2    Assessment/Plan: Patient much improved.  Awake. Alert.  Follows commands.  Mental status change likely secondary to medications.  Patient now off all of his antipsychotics and will need this addressed.    Recommendations: 1.  Psychiatry to evaluate patient and make medication recommendations 2.  No further neurologic intervention is recommended at this time.  If further questions arise, please call or page at that time.  Thank you for allowing neurology to participate in the care of this patient.    LOS: 2 days   Thana Farr, MD [facility] [phone] 11/18/2012  9:15 AM

## 2012-11-18 NOTE — Evaluation (Signed)
Physical Therapy Evaluation Patient Details Name: [name] [name] MRN: [id number] DOB: [birth year] Today's Date: 11/18/2012 Time: 1500-1540 PT Time Calculation (min): 40 min  PT Assessment / Plan / Recommendation Clinical Impression  42 yo s/p resp failure presents at baseline for mobility, limited primarily due to multiple lines and due to tachycardia.  No apparent PT needs at this time nor any limitations to d/c due to mobility.  Recommend ambulate on unit with nursing staff as able, OOB frequently.  No PT follow up.    PT Assessment  Patent does not need any further PT services    Follow Up Recommendations  No PT follow up    Does the patient have the potential to tolerate intense rehabilitation      Barriers to Discharge        Equipment Recommendations  None recommended by PT    Recommendations for Other Services     Frequency      Precautions / Restrictions     Pertinent Vitals/Pain HR 120's at rest, up to 140 with simple bed > chair transfer      Mobility  Bed Mobility Bed Mobility: Supine to Sit Supine to Sit: 7: Independent Transfers Transfers: Sit to Stand;Stand to Sit Sit to Stand: 7: Independent Stand to Sit: 7: Independent Details for Transfer Assistance: initally unsteady but quickly demonstrated independence Ambulation/Gait Ambulation/Gait Assistance: 7: Independent Ambulation Distance (Feet): 5 Feet (didn't want to walk with me right now)    Exercises     PT Diagnosis:    PT Problem List:   PT Treatment Interventions:     PT Goals    Visit Information  Last PT Received On: 11/18/12 Assistance Needed: +1    Subjective Data  Subjective: You let me know when my sister comes back? Patient Stated Goal: home   Prior Functioning  Home Living Lives With: Alone Available Help at Discharge: Family (will stay with his sister at d/c) Type of Home: Apartment Home Access: Stairs to enter Entrance Stairs-Number of Steps: 10 Home Layout: One  level Prior Function Level of Independence: Independent Able to Take Stairs?: Yes Communication Communication: No difficulties    Cognition  Cognition Arousal/Alertness: Awake/alert Behavior During Therapy: WFL for tasks assessed/performed Overall Cognitive Status: Within Functional Limits for tasks assessed    Extremity/Trunk Assessment Right Upper Extremity Assessment RUE ROM/Strength/Tone: [facility] for tasks assessed Left Upper Extremity Assessment LUE ROM/Strength/Tone: WFL for tasks assessed Right Lower Extremity Assessment RLE ROM/Strength/Tone: [facility] for tasks assessed Left Lower Extremity Assessment LLE ROM/Strength/Tone: [facility] for tasks assessed Trunk Assessment Trunk Assessment: Normal   Balance    End of Session PT - End of Session Activity Tolerance: Treatment limited secondary to medical complications (Comment) (tachycardic to 140's with transfer; 120's at rest MD/RN know) Patient left: in chair;with call bell/phone within reach;with nursing in room Nurse Communication: Mobility status (please walk with patient)  GP     Dennis Bast 11/18/2012, 3:53 PM

## 2012-11-18 NOTE — Progress Notes (Signed)
[facility] ADULT ICU REPLACEMENT PROTOCOL FOR AM LAB REPLACEMENT ONLY  The patient does apply for the [facility] Adult ICU Electrolyte Replacment Protocol based on the criteria listed below:   1. Is GFR >/= 40 ml/min? yes  Patient's GFR today is >90 2. Is urine output >/= 0.5 ml/kg/hr for the last 6 hours? yes Patient's UOP is 0.78 ml/kg/hr 3. Is BUN < 60 mg/dL? yes  Patient's BUN today is 6 4. Abnormal electrolyte(s): K+ 3.3 5. Ordered repletion with: see orders 6. If a panic level lab has been reported, has the CCM MD in charge been notified? yes.   Physician:  Dr. Higinio Plan, Nadya Hopwood A 11/18/2012 6:09 AM

## 2012-11-19 ENCOUNTER — Other Ambulatory Visit (HOSPITAL_COMMUNITY): Payer: Medicare Other

## 2012-11-19 LAB — BASIC METABOLIC PANEL
CO2: 25 mEq/L (ref 19–32)
Chloride: 106 mEq/L (ref 96–112)
Potassium: 3.5 mEq/L (ref 3.5–5.1)
Sodium: 140 mEq/L (ref 135–145)

## 2012-11-19 MED ORDER — AMOXICILLIN-POT CLAVULANATE 875-125 MG PO TABS: 1.0000 | ORAL_TABLET | Freq: Two times a day (BID) | ORAL | Status: AC

## 2012-11-19 NOTE — Progress Notes (Signed)
Pt. being discharged at this time.  Sisters [name] [name] and [name] [name] here to pick him up.  [facility]link notified.  Discharge instructions gone over with pt. and sister, [name] [name].  Volunteer services to come pick patient up to take him out at this time.

## 2012-11-19 NOTE — Discharge Summary (Signed)
Physician Discharge Summary  Patient ID: [name] [name] MRN: [id number] DOB/AGE: 10/23/1970 42 y.o.  Admit date: 11/16/2012 Discharge date: 11/19/2012    Discharge Diagnoses:  Active Problems:   Acute respiratory failure with hypoxia   Aspiration pneumonia   Encephalopathy acute    Hospital Summary: [name] [name] is a 42 y.o. y/o male with a PMH of schizophrenia, HTN and also history of crack abuse. As per his sister, he was doing well the morning of admit,  had a shot of Abilify 400 mg IM, and then took his usual medications including clozapine and benztropine. At about 8 pm he started having confusion and slurred speech and the became unresponsive. At arrival to the ED the patient was unresponsive to verbal and tactile stimuli, breathing spontaneously, hemodynamically stable. CT scan of the head was unremarkable. Urine toxicology screen negative. We decided to intubate for airway protection. During intubation he vomited and aspirated. He remained hemodynamically stable. Neurology evaluated at bedside and recommended MRI of the brain which was negative.  Patient was treated empirically with zosyn.  He remained intubated from 5/9 until 5/11 at which time was successfully liberated from mechanical ventilation.  He had resolution of altered mental status and respiratory failure prior to discharge.              DISCHARGE INSTRUCTIONS BY DIAGNOSIS    Acute encephalopathy - with neg imaging. Schizophrenia  Discharge Instructions: -resume home medications at previous doses -follow up with [facility] - called and left message for follow up as they would not give appt to me over phone   Acute Respiratory Failure - in setting of neurological failure.  Resolved.  Aspiration event during intubation   Discharge Instructions: -augmentin for total of 7 days abx for discharge -pt needs to establish a primary care MD -no further resp sx   Hypokalemia -  resolved    CONSULTS Neurology - Dr. Thad Ranger  TUBES / LINES OETT 5/9>>>5/11  MICRO DATA  resp 5/9 >>   ANTIBIOTICS Zosyn 5/9 >>5/12 Augmentin>>>   SIGNIFICANT DIAGNOSTIC STUDIES 5/09 - Aspiration of gastric contents during intubation 5/09 - CT HEAD>>>Negative exam 5/10 - MRI BRAIN>>>Normal MRI appearance of brain without and with contrast. Mild sinus disease and fluid in the mastoid air cells is likely secondary to intubation and some layering nasopharyngeal fluid.   Discharge Exam: General: wdwn  Eyes: Anicteric sclerae.  ENT: Dry mucous membranes. No thrush  Heart: Normal S1, S2. No murmurs, rubs, or gallops appreciated. No bruits, equal pulses.  Lungs: Normal excursion. Good air movement bilaterally Abdomen: Abdomen soft, non-tender and not distended, normoactive bowel sounds. No hepatosplenomegaly or masses.  Musculoskeletal: No clubbing or synovitis.  Skin: No rashes or lesions  Neuro: AAOx4, speech clear, MAE.  Calm behavior.    Filed Vitals:   11/19/12 0800 11/19/12 0900 11/19/12 1000 11/19/12 1004  BP: 164/104 186/93 175/104 149/100  Pulse:      Temp:      TempSrc:      Resp: 25 24 24 24   Height:      Weight:      SpO2: 97% 96% 98%      Discharge Labs  BMET  Recent Labs Lab 11/16/12 2200 11/16/12 2233 11/17/12 0235 11/17/12 0659 11/18/12 0402 11/19/12 0439  NA 139 139  --  134* 141 140  K 3.7 3.8  --  4.3 3.3* 3.5  CL 104 102  --  101 107 106  CO2 27  --   --  21 25 25   GLUCOSE 112* 107*  --  101* 89 105*  BUN 6 5*  --  4* 6 7  CREATININE 0.71 0.60 0.67 0.60 0.96 0.81  CALCIUM 8.5  --   --  8.8 8.5 8.9  MG  --   --   --  1.6  --   --   PHOS  --   --   --  2.5  --   --     CBC  Recent Labs Lab 11/17/12 0235 11/17/12 0659 11/18/12 0402  HGB 15.4 15.5 14.2  HCT 46.5 47.6 43.1  WBC 17.8* 20.5* 9.8  PLT 133* 149* 147*        Discharge Orders   Future Appointments Provider Department Dept Phone   11/19/2012 11:45 AM [facility] [phone]   Future Orders Complete By Expires     Call MD for:  difficulty breathing, headache or visual disturbances  As directed     Call MD for:  extreme fatigue  As directed     Call MD for:  persistant dizziness or light-headedness  As directed     Call MD for:  temperature >100.4  As directed     Diet - low sodium heart healthy  As directed     Discharge instructions  As directed     Comments:      Review your medications carefully.    Increase activity slowly  As directed          Medication List    STOP taking these medications       ABILIFY IM      TAKE these medications       amoxicillin-clavulanate 875-125 MG per tablet  Commonly known as:  AUGMENTIN  Take 1 tablet by mouth 2 (two) times daily.     benztropine 2 MG tablet  Commonly known as:  COGENTIN  Take 2 mg by mouth 2 (two) times daily.     cloZAPine 100 MG tablet  Commonly known as:  CLOZARIL  Take 100 mg by mouth daily.          Disposition: Home with sister.  No home needs identified.  Discharged Condition: [name] [name] has met maximum benefit of inpatient care and is medically stable and cleared for discharge.  Patient is pending follow up as above.      Time spent on disposition:  Greater than 35 minutes.   Signed: Canary Brim, NP-C [facility] Pgr: [phone]  Agree with above  [name], MD ; [facility] [phone].  After 5:30 PM or weekends, call [phone]

## 2013-03-25 ENCOUNTER — Emergency Department (HOSPITAL_COMMUNITY)
Admission: EM | Admit: 2013-03-25 | Discharge: 2013-03-26 | Discharge status: Against Medical Advice | Payer: No Typology Code available for payment source | Attending: Emergency Medicine | Admitting: Emergency Medicine

## 2013-03-25 ENCOUNTER — Encounter (HOSPITAL_COMMUNITY): Payer: Self-pay | Admitting: Family Medicine

## 2013-03-25 DIAGNOSIS — I10 Essential (primary) hypertension: Secondary | ICD-10-CM | POA: Insufficient documentation

## 2013-03-25 DIAGNOSIS — S0993XA Unspecified injury of face, initial encounter: Secondary | ICD-10-CM | POA: Insufficient documentation

## 2013-03-25 DIAGNOSIS — Z9889 Other specified postprocedural states: Secondary | ICD-10-CM | POA: Insufficient documentation

## 2013-03-25 DIAGNOSIS — F209 Schizophrenia, unspecified: Secondary | ICD-10-CM | POA: Insufficient documentation

## 2013-03-25 DIAGNOSIS — T148XXA Other injury of unspecified body region, initial encounter: Secondary | ICD-10-CM

## 2013-03-25 DIAGNOSIS — Y92410 Unspecified street and highway as the place of occurrence of the external cause: Secondary | ICD-10-CM | POA: Insufficient documentation

## 2013-03-25 DIAGNOSIS — Y9389 Activity, other specified: Secondary | ICD-10-CM | POA: Insufficient documentation

## 2013-03-25 DIAGNOSIS — Z79899 Other long term (current) drug therapy: Secondary | ICD-10-CM | POA: Insufficient documentation

## 2013-03-25 DIAGNOSIS — IMO0002 Reserved for concepts with insufficient information to code with codable children: Secondary | ICD-10-CM | POA: Insufficient documentation

## 2013-03-25 DIAGNOSIS — S6980XA Other specified injuries of unspecified wrist, hand and finger(s), initial encounter: Secondary | ICD-10-CM | POA: Insufficient documentation

## 2013-03-25 DIAGNOSIS — F17200 Nicotine dependence, unspecified, uncomplicated: Secondary | ICD-10-CM | POA: Insufficient documentation

## 2013-03-25 DIAGNOSIS — S6990XA Unspecified injury of unspecified wrist, hand and finger(s), initial encounter: Secondary | ICD-10-CM | POA: Insufficient documentation

## 2013-03-25 HISTORY — DX: Essential (primary) hypertension: I10
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
## 2013-03-26 ENCOUNTER — Emergency Department (HOSPITAL_COMMUNITY): Payer: No Typology Code available for payment source

## 2013-03-26 ENCOUNTER — Emergency Department (HOSPITAL_COMMUNITY)
Admission: EM | Admit: 2013-03-26 | Discharge: 2013-03-26 | Disposition: A | Discharge status: Home / Self Care | Payer: No Typology Code available for payment source | Attending: Emergency Medicine | Admitting: Emergency Medicine

## 2013-03-26 ENCOUNTER — Encounter (HOSPITAL_COMMUNITY): Payer: Self-pay | Admitting: *Deleted

## 2013-03-26 DIAGNOSIS — Y92410 Unspecified street and highway as the place of occurrence of the external cause: Secondary | ICD-10-CM | POA: Insufficient documentation

## 2013-03-26 DIAGNOSIS — Y9389 Activity, other specified: Secondary | ICD-10-CM | POA: Insufficient documentation

## 2013-03-26 DIAGNOSIS — IMO0002 Reserved for concepts with insufficient information to code with codable children: Secondary | ICD-10-CM | POA: Insufficient documentation

## 2013-03-26 DIAGNOSIS — I10 Essential (primary) hypertension: Secondary | ICD-10-CM | POA: Insufficient documentation

## 2013-03-26 DIAGNOSIS — F209 Schizophrenia, unspecified: Secondary | ICD-10-CM | POA: Insufficient documentation

## 2013-03-26 DIAGNOSIS — F17200 Nicotine dependence, unspecified, uncomplicated: Secondary | ICD-10-CM | POA: Insufficient documentation

## 2013-03-26 DIAGNOSIS — Z9889 Other specified postprocedural states: Secondary | ICD-10-CM | POA: Insufficient documentation

## 2013-03-26 DIAGNOSIS — Z79899 Other long term (current) drug therapy: Secondary | ICD-10-CM | POA: Insufficient documentation

## 2013-03-26 DIAGNOSIS — S0993XA Unspecified injury of face, initial encounter: Secondary | ICD-10-CM | POA: Insufficient documentation

## 2013-03-26 DIAGNOSIS — S62609A Fracture of unspecified phalanx of unspecified finger, initial encounter for closed fracture: Secondary | ICD-10-CM

## 2013-03-26 MED ORDER — IBUPROFEN 200 MG PO TABS: 600.0000 mg | ORAL_TABLET | Freq: Once | ORAL | Status: DC
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
## 2013-03-26 NOTE — Progress Notes (Signed)
Orthopedic Tech Progress Note Patient Details:  [name] [name] [birth year] [id number]  Ortho Devices Type of Ortho Device: Finger splint Ortho Device/Splint Interventions: Application   Cammer, Mickie Bail 03/26/2013, 12:12 PM
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
## 2013-03-26 NOTE — ED Provider Notes (Signed)
Medical screening examination/treatment/procedure(s) were performed by non-physician practitioner and as supervising physician I was immediately available for consultation/collaboration.   Reesa Gotschall, MD 03/26/13 2141 

## 2013-03-27 NOTE — ED Provider Notes (Signed)
Medical screening examination/treatment/procedure(s) were performed by non-physician practitioner and as supervising physician I was immediately available for consultation/collaboration.  Rosland Riding R. Idris Edmundson, MD 03/27/13 2027 

## 2013-03-28 ENCOUNTER — Other Ambulatory Visit: Payer: Self-pay | Admitting: Orthopedic Surgery

## 2013-03-28 ENCOUNTER — Encounter (HOSPITAL_BASED_OUTPATIENT_CLINIC_OR_DEPARTMENT_OTHER): Payer: Self-pay | Admitting: *Deleted
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
## 2013-04-01 ENCOUNTER — Encounter (HOSPITAL_BASED_OUTPATIENT_CLINIC_OR_DEPARTMENT_OTHER)
Admission: RE | Admit: 2013-04-01 | Discharge: 2013-04-01 | Disposition: A | Discharge status: Home / Self Care | Payer: Medicare Other | Source: Ambulatory Visit | Attending: Orthopedic Surgery | Admitting: Orthopedic Surgery

## 2013-04-01 LAB — BASIC METABOLIC PANEL
CO2: 27 mEq/L (ref 19–32)
Calcium: 9.2 mg/dL (ref 8.4–10.5)
Creatinine, Ser: 0.8 mg/dL (ref 0.50–1.35)
Glucose, Bld: 80 mg/dL (ref 70–99)

## 2013-04-02 ENCOUNTER — Encounter (HOSPITAL_BASED_OUTPATIENT_CLINIC_OR_DEPARTMENT_OTHER)
Admission: RE | Disposition: A | Discharge status: Home / Self Care | Payer: Self-pay | Source: Ambulatory Visit | Attending: Orthopedic Surgery

## 2013-04-02 ENCOUNTER — Ambulatory Visit (HOSPITAL_BASED_OUTPATIENT_CLINIC_OR_DEPARTMENT_OTHER)
Admission: RE | Admit: 2013-04-02 | Discharge: 2013-04-02 | Disposition: A | Discharge status: Home / Self Care | Payer: No Typology Code available for payment source | Source: Ambulatory Visit | Attending: Orthopedic Surgery | Admitting: Orthopedic Surgery

## 2013-04-02 ENCOUNTER — Encounter (HOSPITAL_BASED_OUTPATIENT_CLINIC_OR_DEPARTMENT_OTHER): Payer: Self-pay

## 2013-04-02 ENCOUNTER — Ambulatory Visit (HOSPITAL_BASED_OUTPATIENT_CLINIC_OR_DEPARTMENT_OTHER): Payer: No Typology Code available for payment source | Admitting: Anesthesiology

## 2013-04-02 ENCOUNTER — Encounter (HOSPITAL_BASED_OUTPATIENT_CLINIC_OR_DEPARTMENT_OTHER): Payer: Self-pay | Admitting: Anesthesiology

## 2013-04-02 DIAGNOSIS — F209 Schizophrenia, unspecified: Secondary | ICD-10-CM | POA: Insufficient documentation

## 2013-04-02 DIAGNOSIS — I10 Essential (primary) hypertension: Secondary | ICD-10-CM | POA: Insufficient documentation

## 2013-04-02 DIAGNOSIS — S62609A Fracture of unspecified phalanx of unspecified finger, initial encounter for closed fracture: Secondary | ICD-10-CM | POA: Insufficient documentation

## 2013-04-02 HISTORY — DX: Pneumonia, unspecified organism: J18.9

## 2013-04-02 HISTORY — PX: OPEN REDUCTION INTERNAL FIXATION (ORIF) PROXIMAL PHALANX: SHX6235

## 2013-04-02 SURGERY — OPEN REDUCTION INTERNAL FIXATION (ORIF) PROXIMAL PHALANX
Anesthesia: General | Site: Finger | Laterality: Right | Wound class: Clean

## 2013-04-02 MED ORDER — OXYCODONE HCL 5 MG/5ML PO SOLN: 5.0000 mg | Freq: Once | ORAL | Status: DC | PRN

## 2013-04-02 MED ORDER — OXYCODONE HCL 5 MG PO TABS: 5.0000 mg | ORAL_TABLET | Freq: Once | ORAL | Status: DC | PRN

## 2013-04-02 MED ORDER — MEPERIDINE HCL 25 MG/ML IJ SOLN: 6.2500 mg | INTRAMUSCULAR | Status: DC | PRN

## 2013-04-02 MED ORDER — MIDAZOLAM HCL 5 MG/5ML IJ SOLN
INTRAMUSCULAR | Status: DC | PRN
  Administered 2013-04-02: 1 mg via INTRAVENOUS

## 2013-04-02 MED ORDER — MORPHINE SULFATE 10 MG/ML IJ SOLN
INTRAMUSCULAR | Status: DC | PRN
  Administered 2013-04-02 (×3): 2.5 mg via INTRAVENOUS

## 2013-04-02 MED ORDER — MIDAZOLAM HCL 2 MG/2ML IJ SOLN: 1.0000 mg | INTRAMUSCULAR | Status: DC | PRN

## 2013-04-02 MED ORDER — 0.9 % SODIUM CHLORIDE (POUR BTL) OPTIME
TOPICAL | Status: DC | PRN
  Administered 2013-04-02: 200 mL

## 2013-04-02 MED ORDER — DEXAMETHASONE SODIUM PHOSPHATE 4 MG/ML IJ SOLN
INTRAMUSCULAR | Status: DC | PRN
  Administered 2013-04-02: 100 mg via INTRAVENOUS

## 2013-04-02 MED ORDER — FENTANYL CITRATE 0.05 MG/ML IJ SOLN
INTRAMUSCULAR | Status: DC | PRN
  Administered 2013-04-02: 100 ug via INTRAVENOUS

## 2013-04-02 MED ORDER — PROPOFOL 10 MG/ML IV BOLUS
INTRAVENOUS | Status: DC | PRN
  Administered 2013-04-02: 300 mg via INTRAVENOUS

## 2013-04-02 MED ORDER — MIDAZOLAM HCL 2 MG/2ML IJ SOLN: 0.5000 mg | Freq: Once | INTRAMUSCULAR | Status: DC | PRN

## 2013-04-02 MED ORDER — CHLORHEXIDINE GLUCONATE 4 % EX LIQD: 60.0000 mL | Freq: Once | CUTANEOUS | Status: DC

## 2013-04-02 MED ORDER — FENTANYL CITRATE 0.05 MG/ML IJ SOLN: 50.0000 ug | INTRAMUSCULAR | Status: DC | PRN

## 2013-04-02 MED ORDER — LIDOCAINE HCL (CARDIAC) 20 MG/ML IV SOLN
INTRAVENOUS | Status: DC | PRN
  Administered 2013-04-02: 100 mg via INTRAVENOUS

## 2013-04-02 MED ORDER — HYDROMORPHONE HCL PF 1 MG/ML IJ SOLN: 0.2500 mg | INTRAMUSCULAR | Status: DC | PRN

## 2013-04-02 MED ORDER — ONDANSETRON HCL 4 MG/2ML IJ SOLN
INTRAMUSCULAR | Status: DC | PRN
  Administered 2013-04-02: 4 mg via INTRAVENOUS

## 2013-04-02 MED ORDER — LACTATED RINGERS IV SOLN
INTRAVENOUS | Status: DC
  Administered 2013-04-02 (×2): via INTRAVENOUS

## 2013-04-02 MED ORDER — OXYCODONE-ACETAMINOPHEN 5-325 MG PO TABS: ORAL_TABLET | ORAL | Status: DC

## 2013-04-02 MED ORDER — CEFAZOLIN SODIUM-DEXTROSE 2-3 GM-% IV SOLR
INTRAVENOUS | Status: DC | PRN
  Administered 2013-04-02: 2 g via INTRAVENOUS

## 2013-04-02 MED ORDER — BUPIVACAINE HCL (PF) 0.25 % IJ SOLN
INTRAMUSCULAR | Status: DC | PRN
  Administered 2013-04-02: 7 mL

## 2013-04-02 MED ORDER — PROMETHAZINE HCL 25 MG/ML IJ SOLN: 6.2500 mg | INTRAMUSCULAR | Status: DC | PRN

## 2013-04-02 SURGICAL SUPPLY — 67 items
BANDAGE ELASTIC 3 VELCRO ST LF (GAUZE/BANDAGES/DRESSINGS) ×1 IMPLANT
BANDAGE GAUZE ELAST BULKY 4 IN (GAUZE/BANDAGES/DRESSINGS) ×2 IMPLANT
BIT DRILL 1.1 (BIT) ×2
BIT DRILL 60X20X1.1XQC TMX (BIT) IMPLANT
BIT DRL 60X20X1.1XQC TMX (BIT) ×1
BLADE MINI RND TIP GREEN BEAV (BLADE) IMPLANT
BLADE SURG 15 STRL LF DISP TIS (BLADE) ×2 IMPLANT
BLADE SURG 15 STRL SS (BLADE) ×4
BNDG CMPR 9X4 STRL LF SNTH (GAUZE/BANDAGES/DRESSINGS) ×1
BNDG ESMARK 4X9 LF (GAUZE/BANDAGES/DRESSINGS) ×1 IMPLANT
CHLORAPREP W/TINT 26ML (MISCELLANEOUS) ×2 IMPLANT
CLOTH BEACON ORANGE TIMEOUT ST (SAFETY) ×2 IMPLANT
CORDS BIPOLAR (ELECTRODE) ×2 IMPLANT
COVER MAYO STAND STRL (DRAPES) ×2 IMPLANT
COVER TABLE BACK 60X90 (DRAPES) ×2 IMPLANT
CUFF TOURNIQUET SINGLE 18IN (TOURNIQUET CUFF) ×1 IMPLANT
CUFF TOURNIQUET SINGLE 24IN (TOURNIQUET CUFF) ×1 IMPLANT
DRAPE EXTREMITY T 121X128X90 (DRAPE) ×2 IMPLANT
DRAPE OEC MINIVIEW 54X84 (DRAPES) ×1 IMPLANT
DRAPE SURG 17X23 STRL (DRAPES) ×2 IMPLANT
DRIVER BIT 1.5 (TRAUMA) ×1 IMPLANT
GAUZE XEROFORM 1X8 LF (GAUZE/BANDAGES/DRESSINGS) ×2 IMPLANT
GLOVE BIO SURGEON STRL SZ7 (GLOVE) ×1 IMPLANT
GLOVE BIO SURGEON STRL SZ7.5 (GLOVE) ×2 IMPLANT
GLOVE BIOGEL PI IND STRL 7.5 (GLOVE) IMPLANT
GLOVE BIOGEL PI IND STRL 8 (GLOVE) ×1 IMPLANT
GLOVE BIOGEL PI IND STRL 8.5 (GLOVE) IMPLANT
GLOVE BIOGEL PI INDICATOR 7.5 (GLOVE) ×1
GLOVE BIOGEL PI INDICATOR 8 (GLOVE) ×1
GLOVE BIOGEL PI INDICATOR 8.5 (GLOVE) ×1
GLOVE EXAM NITRILE MD LF STRL (GLOVE) ×1 IMPLANT
GLOVE SURG ORTHO 8.0 STRL STRW (GLOVE) ×1 IMPLANT
GOWN BRE IMP PREV XXLGXLNG (GOWN DISPOSABLE) ×3 IMPLANT
GOWN PREVENTION PLUS XLARGE (GOWN DISPOSABLE) ×2 IMPLANT
LOCK SCREW 1.5X9MM (Screw) ×2 IMPLANT
NDL HYPO 25X1 1.5 SAFETY (NEEDLE) IMPLANT
NEEDLE HYPO 25X1 1.5 SAFETY (NEEDLE) ×2 IMPLANT
NS IRRIG 1000ML POUR BTL (IV SOLUTION) ×2 IMPLANT
PACK BASIN DAY SURGERY FS (CUSTOM PROCEDURE TRAY) ×2 IMPLANT
PAD CAST 3X4 CTTN HI CHSV (CAST SUPPLIES) IMPLANT
PAD CAST 4YDX4 CTTN HI CHSV (CAST SUPPLIES) IMPLANT
PADDING CAST ABS 4INX4YD NS (CAST SUPPLIES) ×1
PADDING CAST ABS COTTON 4X4 ST (CAST SUPPLIES) ×1 IMPLANT
PADDING CAST COTTON 3X4 STRL (CAST SUPPLIES) ×2
PADDING CAST COTTON 4X4 STRL (CAST SUPPLIES)
PLATE T SMALL 1.5MM (Plate) ×1 IMPLANT
SCREW L 1.5X14 (Screw) ×3 IMPLANT
SCREW LOCK 1.5X9MM (Screw) IMPLANT
SCREW LOCKING 1.5X10 (Screw) ×1 IMPLANT
SCREW LOCKING 1.5X16 (Screw) ×1 IMPLANT
SCREW LOCKING 1.5X8 (Screw) ×1 IMPLANT
SCREW NL 1.5X12 (Screw) ×1 IMPLANT
SLEEVE SCD COMPRESS KNEE MED (MISCELLANEOUS) ×1 IMPLANT
SPLINT PLASTER CAST XFAST 3X15 (CAST SUPPLIES) IMPLANT
SPLINT PLASTER XTRA FASTSET 3X (CAST SUPPLIES) ×20
SPONGE GAUZE 4X4 12PLY (GAUZE/BANDAGES/DRESSINGS) ×2 IMPLANT
STOCKINETTE 4X48 STRL (DRAPES) ×2 IMPLANT
SUT ETHILON 3 0 PS 1 (SUTURE) IMPLANT
SUT ETHILON 4 0 PS 2 18 (SUTURE) ×2 IMPLANT
SUT MERSILENE 4 0 P 3 (SUTURE) ×1 IMPLANT
SUT VIC AB 3-0 PS1 18 (SUTURE)
SUT VIC AB 3-0 PS1 18XBRD (SUTURE) IMPLANT
SUT VICRYL 4-0 PS2 18IN ABS (SUTURE) ×1 IMPLANT
SYR BULB 3OZ (MISCELLANEOUS) ×2 IMPLANT
SYR CONTROL 10ML LL (SYRINGE) ×1 IMPLANT
TOWEL OR 17X24 6PK STRL BLUE (TOWEL DISPOSABLE) ×4 IMPLANT
UNDERPAD 30X30 INCONTINENT (UNDERPADS AND DIAPERS) ×2 IMPLANT

## 2013-04-02 NOTE — Anesthesia Preprocedure Evaluation (Addendum)
Anesthesia Evaluation  Patient identified by MRN, date of birth, ID band Patient awake    Reviewed: Allergy & Precautions, H&P , NPO status , Patient's Chart, lab work & pertinent test results  History of Anesthesia Complications Negative for: history of anesthetic complications  Airway Mallampati: I TM Distance: >3 FB Neck ROM: Full    Dental  (+) Poor Dentition and Dental Advisory Given   Pulmonary Current Smoker,  5/14 aspiration pneumonia after mental status changes with his psych meds breath sounds clear to auscultation  Pulmonary exam normal       Cardiovascular hypertension, Pt. on medications Rhythm:Regular Rate:Normal     Neuro/Psych Schizophrenia negative neurological ROS     GI/Hepatic Neg liver ROS, GERD-  Controlled,  Endo/Other  Morbid obesity  Renal/GU negative Renal ROS     Musculoskeletal   Abdominal (+) + obese,   Peds  Hematology   Anesthesia Other Findings   Reproductive/Obstetrics                           Anesthesia Physical Anesthesia Plan  ASA: III  Anesthesia Plan: General   Post-op Pain Management:    Induction: Intravenous  Airway Management Planned: LMA  Additional Equipment:   Intra-op Plan:   Post-operative Plan:   Informed Consent: I have reviewed the patients History and Physical, chart, labs and discussed the procedure including the risks, benefits and alternatives for the proposed anesthesia with the patient or authorized representative who has indicated his/her understanding and acceptance.   Dental advisory given and Consent reviewed with POA  Plan Discussed with: CRNA and Surgeon  Anesthesia Plan Comments: (Plan routine monitors, GA- LMA OK Discussed with patient's sister and guardian, Daxon Kyne)       Anesthesia Quick Evaluation
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
## 2013-04-02 NOTE — Anesthesia Postprocedure Evaluation (Signed)
  Anesthesia Post-op Note  Patient: [name] [name]  Procedure(s) Performed: Procedure(s): OPEN REDUCTION INTERNAL FIXATION (ORIF) RIGHT SMALL FINGER (Right)  Patient Location: PACU  Anesthesia Type:General  Level of Consciousness: awake, alert , oriented and patient cooperative  Airway and Oxygen Therapy: Patient Spontanous Breathing  Post-op Pain: none  Post-op Assessment: Post-op Vital signs reviewed, Patient's Cardiovascular Status Stable, Respiratory Function Stable, Patent Airway, No signs of Nausea or vomiting and Pain level controlled  Post-op Vital Signs: Reviewed and stable  Complications: No apparent anesthesia complications

## 2013-04-02 NOTE — Transfer of Care (Signed)
Immediate Anesthesia Transfer of Care Note  Patient: [name] [name]  Procedure(s) Performed: Procedure(s): OPEN REDUCTION INTERNAL FIXATION (ORIF) RIGHT SMALL FINGER (Right)  Patient Location: PACU  Anesthesia Type:General  Level of Consciousness: awake  Airway & Oxygen Therapy: Patient Spontanous Breathing and Patient connected to face mask oxygen  Post-op Assessment: Report given to PACU RN and Post -op Vital signs reviewed and stable  Post vital signs: Reviewed and stable  Complications: No apparent anesthesia complications

## 2013-04-02 NOTE — Op Note (Signed)
[id number] 

## 2013-04-02 NOTE — Anesthesia Procedure Notes (Signed)
Procedure Name: LMA Insertion Date/Time: 04/02/2013 7:47 AM Performed by: Zenia Resides D Pre-anesthesia Checklist: Patient identified, Emergency Drugs available, Suction available and Patient being monitored Patient Re-evaluated:Patient Re-evaluated prior to inductionOxygen Delivery Method: Circle System Utilized Preoxygenation: Pre-oxygenation with 100% oxygen Intubation Type: IV induction Ventilation: Mask ventilation without difficulty LMA: LMA inserted LMA Size: 5.0 Number of attempts: 1 Airway Equipment and Method: bite block Placement Confirmation: positive ETCO2 Tube secured with: Tape Dental Injury: Teeth and Oropharynx as per pre-operative assessment

## 2013-04-02 NOTE — Brief Op Note (Signed)
04/02/2013  9:27 AM  PATIENT:  [name] [name]  42 y.o. male  PRE-OPERATIVE DIAGNOSIS:  RIGHT SMALL FINGER PROXIMAL PHALANX FRACTURE  POST-OPERATIVE DIAGNOSIS:  RIGHT SMALL FINGER PROXIMAL PHALANX FRACTURE  PROCEDURE:  Procedure(s): OPEN REDUCTION INTERNAL FIXATION (ORIF) RIGHT SMALL FINGER  SURGEON:  Surgeon(s): Tami Ribas, MD Nicki Reaper, MD  PHYSICIAN ASSISTANT:   ASSISTANTS: Cindee Salt, MD   ANESTHESIA:   general  EBL:  Total I/O In: 1000 [I.V.:1000] Out: -   DRAINS: none   LOCAL MEDICATIONS USED:  MARCAINE     SPECIMEN:  No Specimen  DISPOSITION OF SPECIMEN:  N/A  COUNTS:  YES  TOURNIQUET:   Total Tourniquet Time Documented: Upper Arm (Right) - 73 minutes Total: Upper Arm (Right) - 73 minutes   DICTATION: .Other Dictation: Dictation Number [id number]  PLAN OF CARE: Discharge to home after PACU
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
## 2013-04-03 ENCOUNTER — Encounter (HOSPITAL_BASED_OUTPATIENT_CLINIC_OR_DEPARTMENT_OTHER): Payer: Self-pay | Admitting: Orthopedic Surgery

## 2013-04-17 ENCOUNTER — Emergency Department (HOSPITAL_COMMUNITY)
Admission: EM | Admit: 2013-04-17 | Discharge: 2013-04-20 | Disposition: A | Discharge status: Home / Self Care | Payer: Medicare Other | Attending: Emergency Medicine | Admitting: Emergency Medicine

## 2013-04-17 ENCOUNTER — Encounter (HOSPITAL_COMMUNITY): Payer: Self-pay | Admitting: Emergency Medicine

## 2013-04-17 DIAGNOSIS — F1110 Opioid abuse, uncomplicated: Secondary | ICD-10-CM | POA: Insufficient documentation

## 2013-04-17 DIAGNOSIS — I10 Essential (primary) hypertension: Secondary | ICD-10-CM | POA: Insufficient documentation

## 2013-04-17 DIAGNOSIS — F911 Conduct disorder, childhood-onset type: Secondary | ICD-10-CM | POA: Insufficient documentation

## 2013-04-17 DIAGNOSIS — F209 Schizophrenia, unspecified: Secondary | ICD-10-CM | POA: Insufficient documentation

## 2013-04-17 DIAGNOSIS — Z8701 Personal history of pneumonia (recurrent): Secondary | ICD-10-CM | POA: Insufficient documentation

## 2013-04-17 DIAGNOSIS — F17200 Nicotine dependence, unspecified, uncomplicated: Secondary | ICD-10-CM | POA: Insufficient documentation

## 2013-04-17 DIAGNOSIS — Z79899 Other long term (current) drug therapy: Secondary | ICD-10-CM | POA: Insufficient documentation

## 2013-04-17 DIAGNOSIS — R4689 Other symptoms and signs involving appearance and behavior: Secondary | ICD-10-CM

## 2013-04-17 LAB — CBC
HCT: 46.7 % (ref 39.0–52.0)
Hemoglobin: 15.6 g/dL (ref 13.0–17.0)
MCH: 28.5 pg (ref 26.0–34.0)
MCV: 85.4 fL (ref 78.0–100.0)
Platelets: 151 10*3/uL (ref 150–400)
RBC: 5.47 MIL/uL (ref 4.22–5.81)
WBC: 8.2 10*3/uL (ref 4.0–10.5)

## 2013-04-17 LAB — COMPREHENSIVE METABOLIC PANEL
ALT: 19 U/L (ref 0–53)
AST: 18 U/L (ref 0–37)
Albumin: 4 g/dL (ref 3.5–5.2)
Alkaline Phosphatase: 85 U/L (ref 39–117)
BUN: 9 mg/dL (ref 6–23)
CO2: 27 mEq/L (ref 19–32)
Chloride: 98 mEq/L (ref 96–112)
Creatinine, Ser: 0.8 mg/dL (ref 0.50–1.35)
GFR calc Af Amer: >90 mL/min (ref >90)
GFR calc non Af Amer: >90 mL/min (ref >90)
Sodium: 134 mEq/L — ABNORMAL LOW (ref 135–145)
Total Bilirubin: 0.3 mg/dL (ref 0.3–1.2)

## 2013-04-17 LAB — RAPID URINE DRUG SCREEN, HOSP PERFORMED
Amphetamines: NOT DETECTED
Benzodiazepines: NOT DETECTED
Opiates: POSITIVE — AB
Tetrahydrocannabinol: NOT DETECTED

## 2013-04-17 LAB — SALICYLATE LEVEL: Salicylate Lvl: <2 mg/dL — ABNORMAL LOW (ref 2.8–20.0)

## 2013-04-17 MED ORDER — LORAZEPAM 1 MG PO TABS
1.0000 mg | ORAL_TABLET | Freq: Three times a day (TID) | ORAL | Status: DC | PRN
  Administered 2013-04-18 – 2013-04-19 (×4): 1 mg via ORAL
  Filled 2013-04-17: qty 1
  Filled 2013-04-17: qty 2
  Filled 2013-04-17 (×2): qty 1

## 2013-04-17 MED ORDER — CLONAZEPAM 1 MG PO TABS: 2.0000 mg | ORAL_TABLET | Freq: Two times a day (BID) | ORAL | Status: DC | PRN

## 2013-04-17 MED ORDER — ALUM & MAG HYDROXIDE-SIMETH 200-200-20 MG/5ML PO SUSP: 30.0000 mL | ORAL | Status: DC | PRN

## 2013-04-17 MED ORDER — NICOTINE 21 MG/24HR TD PT24
21.0000 mg | MEDICATED_PATCH | Freq: Every day | TRANSDERMAL | Status: DC
  Administered 2013-04-17 – 2013-04-19 (×3): 21 mg via TRANSDERMAL
  Filled 2013-04-17 (×3): qty 1

## 2013-04-17 MED ORDER — ONDANSETRON HCL 4 MG PO TABS: 4.0000 mg | ORAL_TABLET | Freq: Three times a day (TID) | ORAL | Status: DC | PRN

## 2013-04-17 MED ORDER — ACETAMINOPHEN 325 MG PO TABS: 650.0000 mg | ORAL_TABLET | ORAL | Status: DC | PRN

## 2013-04-17 MED ORDER — ZOLPIDEM TARTRATE 5 MG PO TABS
5.0000 mg | ORAL_TABLET | Freq: Every evening | ORAL | Status: DC | PRN
  Administered 2013-04-17 – 2013-04-19 (×3): 5 mg via ORAL
  Filled 2013-04-17 (×3): qty 1

## 2013-04-17 MED ORDER — QUETIAPINE FUMARATE ER 300 MG PO TB24
300.0000 mg | ORAL_TABLET | Freq: Every day | ORAL | Status: DC
  Administered 2013-04-17 – 2013-04-19 (×3): 300 mg via ORAL
  Filled 2013-04-17 (×3): qty 1

## 2013-04-17 MED ORDER — BENZTROPINE MESYLATE 1 MG PO TABS
1.0000 mg | ORAL_TABLET | Freq: Every day | ORAL | Status: DC
  Administered 2013-04-17 – 2013-04-19 (×3): 1 mg via ORAL
  Filled 2013-04-17 (×3): qty 1

## 2013-04-17 MED ORDER — LISINOPRIL 10 MG PO TABS
10.0000 mg | ORAL_TABLET | Freq: Every day | ORAL | Status: DC
  Administered 2013-04-18 – 2013-04-19 (×2): 10 mg via ORAL
  Filled 2013-04-17 (×2): qty 1

## 2013-04-17 MED ORDER — IBUPROFEN 200 MG PO TABS: 600.0000 mg | ORAL_TABLET | Freq: Three times a day (TID) | ORAL | Status: DC | PRN
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
## 2013-04-17 NOTE — ED Notes (Signed)
Per [employer], family called to pick up Pt due to erratic behavior.  Sts previous aggressive behavior, but Pt is currently cooperative.  Pt denies SI/HI.  Hx of schizophrenia.  Pt's caretaker sts pt has been taking all medications as prescribed.  Sts erratic behavior started x 3 days ago.
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
## 2013-04-18 ENCOUNTER — Encounter (HOSPITAL_COMMUNITY): Payer: Self-pay | Admitting: Registered Nurse

## 2013-04-18 DIAGNOSIS — F209 Schizophrenia, unspecified: Secondary | ICD-10-CM

## 2013-04-18 MED ORDER — HALOPERIDOL LACTATE 5 MG/ML IJ SOLN: 10.0000 mg | Freq: Three times a day (TID) | INTRAMUSCULAR | Status: DC | PRN

## 2013-04-18 MED ORDER — HALOPERIDOL DECANOATE 100 MG/ML IM SOLN
100.0000 mg | INTRAMUSCULAR | Status: DC
  Administered 2013-04-18: 100 mg via INTRAMUSCULAR
  Filled 2013-04-18: qty 1

## 2013-04-18 MED ORDER — FLUPHENAZINE DECANOATE 25 MG/ML IJ SOLN: 50.0000 mg | Freq: Once | INTRAMUSCULAR | Status: DC

## 2013-04-18 MED ORDER — HALOPERIDOL 5 MG PO TABS: 10.0000 mg | ORAL_TABLET | Freq: Every day | ORAL | Status: DC

## 2013-04-18 MED ORDER — HALOPERIDOL 5 MG PO TABS
10.0000 mg | ORAL_TABLET | Freq: Three times a day (TID) | ORAL | Status: DC | PRN
  Administered 2013-04-18 – 2013-04-19 (×3): 10 mg via ORAL
  Filled 2013-04-18 (×3): qty 2

## 2013-04-18 MED ORDER — OLANZAPINE 10 MG PO TBDP
10.0000 mg | ORAL_TABLET | Freq: Every day | ORAL | Status: DC
  Filled 2013-04-18: qty 1

## 2013-04-18 MED ORDER — FLUPHENAZINE DECANOATE 25 MG/ML IJ SOLN
50.0000 mg | INTRAMUSCULAR | Status: DC
  Filled 2013-04-18: qty 2

## 2013-04-18 MED ORDER — OLANZAPINE 10 MG PO TBDP
10.0000 mg | ORAL_TABLET | Freq: Once | ORAL | Status: AC
  Administered 2013-04-18: 10 mg via ORAL
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
## 2013-04-18 NOTE — ED Notes (Signed)
To the bathroom 

## 2013-04-18 NOTE — BH Assessment (Signed)
Pt's sister and guardian Leeroy Lovings brought guardianship papers and writer made copy of papers for pt chart. Ms. Falkner and friend [employer] spoke at length with Assunta Found NP.  Magistrate Lyn Hollingshead confirmed receipt of IVC paperwork. Originals placed in IVC log in Psych ED.  Evette Cristal, [place] Assessment Counselor

## 2013-04-18 NOTE — ED Notes (Signed)
Up tot he bathroom began yelling/cusing/threathening

## 2013-04-18 NOTE — ED Notes (Signed)
In the bathroom

## 2013-04-18 NOTE — ED Notes (Signed)
visiter into see

## 2013-04-18 NOTE — ED Notes (Signed)
Snack given, in room watching tv

## 2013-04-18 NOTE — ED Notes (Signed)
Up to the desk on the phone 

## 2013-04-18 NOTE — ED Notes (Signed)
Up in the hall, wanting to leave and is aware that the psych wants him to stay (Pt is to be IVC'd) per NP.  Requesting to talk w/ the md-md is aware.

## 2013-04-18 NOTE — ED Provider Notes (Signed)
Medical screening examination/treatment/procedure(s) were performed by non-physician practitioner and as supervising physician I was immediately available for consultation/collaboration.   Junius Argyle, MD 04/18/13 [phone]

## 2013-04-18 NOTE — BH Assessment (Signed)
Writer called pt's sister [phone]. Spoke w/ family friend Therapist, sports. He says that sister Enrique Sack is in meeting but the two of them are coming to visit pt as soon as Kendra's mtg is finished. Writer asked that Enrique Sack bring guardian paperwork when they visit Psych ED  Evette Cristal, [place] Assessment Counselor

## 2013-04-18 NOTE — ED Notes (Signed)
Up in hall, staring at at glass in door talking to self

## 2013-04-18 NOTE — ED Notes (Signed)
Up tot he bathroom to shower and change scrubs 

## 2013-04-18 NOTE — ED Notes (Addendum)
Up to the bathroom, began yelling in the bathroom, pt continues to deny voices, but was in his room talking to his self.  Pt also requesting a "prn" to help him relax

## 2013-04-18 NOTE — ED Notes (Signed)
Pt up to the bathroom and back to his room.  When returning to room pt cursing at the TV and is distracted w/ his conversation w/ the TV.  Pt cooperative, medicated w/o difficulty

## 2013-04-18 NOTE — ED Notes (Signed)
Much calmer, resting quietly watching tv

## 2013-04-18 NOTE — ED Notes (Addendum)
Up in room, talking to the TV, but is polite and cooperative w/ this Clinical research associate

## 2013-04-18 NOTE — ED Notes (Signed)
PA in to talk w pt

## 2013-04-18 NOTE — ED Notes (Signed)
Fall and Psych assessment not completed due to pt being psychotic and sleeping.

## 2013-04-18 NOTE — ED Notes (Signed)
Pt up in room talking to self.  When questioned who hw was talking to he reported that he had been talking to the other nurse.  No one was observed in room.

## 2013-04-18 NOTE — ED Notes (Signed)
Shuvon NP and dr Ladona Ridgel into see

## 2013-04-18 NOTE — ED Notes (Signed)
Up to the bathroom to brush his teeth 

## 2013-04-19 DIAGNOSIS — R454 Irritability and anger: Secondary | ICD-10-CM

## 2013-04-19 NOTE — Consult Note (Signed)
Note reviewed and agreed with  

## 2013-04-19 NOTE — ED Notes (Signed)
Pt awake and up to nurses station asking for something to drink. Staff met pt in room brought sandwich,chesse and a sprite. Pt took meds without distress. Pt calm and cooperative. Pt states he is supposed to go to his sisters tomorrow. Pt denies SI,HI,Ah,AV. Speech WNL . Pt up to bathroom.

## 2013-04-19 NOTE — ED Notes (Signed)
Pt pacing and cussing, appears to be escalating after talking with the doctor and being told that he is not going to be discharged today and being explained to that he was IVC'd yesterday, prn medications given for agitation, pt still in hallway pacing and clinching fist at this time, verbal de-escalation continued, pt cooperative with taking po medications.

## 2013-04-19 NOTE — Consult Note (Signed)
  Psychiatric Specialty Exam: Physical Exam  ROS  Blood pressure 112/74, pulse 60, temperature 97.9 F (36.6 C), temperature source Oral, resp. rate 18, SpO2 97.00%.There is no weight on file to calculate BMI.  General Appearance: Casual  Eye Contact::  Good  Speech:  Pressured  Volume:  Normal  Mood:  Angry and Irritable  Affect:  Appropriate  Thought Process:  Goal Directed  Orientation:  Full (Time, Place, and Person)  Thought Content:  Hallucinations: today he denies hallucinations and has not been talking to himself  Suicidal Thoughts:  No  Homicidal Thoughts:  No  Memory:  Immediate;   Fair Recent;   Fair Remote;   Fair  Judgement:  Poor  Insight:  Lacking  Psychomotor Activity:  Normal  Concentration:  Good  Recall:  Fair  Akathisia:  Negative  Handed:  Right  AIMS (if indicated):     Assets:  Housing Social Support  Sleep:   adequate   [name] [name] is irritable today.  He still believes he can just leave when he wants to and spends his time with me trying to convince me that he can go with various family members. He did get a haldol decanoate injection yesterday though his sister says haldol has never worked before.  Still pursue inpatient bed at [facility] if possible and if not then at whatever inpatient facility can be found.

## 2013-04-19 NOTE — ED Notes (Signed)
Pt was sleeping, will retreive vitals at a later time

## 2013-04-20 ENCOUNTER — Inpatient Hospital Stay (HOSPITAL_COMMUNITY)
Admission: AD | Admit: 2013-04-20 | Discharge: 2013-04-26 | DRG: 885 | Disposition: A | Discharge status: Home / Self Care | Payer: 59 | Source: Intra-hospital | Attending: Psychiatry | Admitting: Psychiatry

## 2013-04-20 ENCOUNTER — Encounter (HOSPITAL_COMMUNITY): Payer: Self-pay

## 2013-04-20 DIAGNOSIS — Z79899 Other long term (current) drug therapy: Secondary | ICD-10-CM

## 2013-04-20 DIAGNOSIS — F200 Paranoid schizophrenia: Secondary | ICD-10-CM

## 2013-04-20 DIAGNOSIS — I10 Essential (primary) hypertension: Secondary | ICD-10-CM | POA: Diagnosis present

## 2013-04-20 MED ORDER — MAGNESIUM HYDROXIDE 400 MG/5ML PO SUSP: 30.0000 mL | Freq: Every day | ORAL | Status: DC | PRN

## 2013-04-20 MED ORDER — HALOPERIDOL LACTATE 5 MG/ML IJ SOLN
5.0000 mg | Freq: Once | INTRAMUSCULAR | Status: AC
  Administered 2013-04-20: 5 mg via INTRAMUSCULAR
  Filled 2013-04-20 (×2): qty 1

## 2013-04-20 MED ORDER — BENZTROPINE MESYLATE 1 MG PO TABS
1.0000 mg | ORAL_TABLET | Freq: Every day | ORAL | Status: DC
  Administered 2013-04-20 – 2013-04-22 (×3): 1 mg via ORAL
  Filled 2013-04-20 (×5): qty 1

## 2013-04-20 MED ORDER — LISINOPRIL 10 MG PO TABS
10.0000 mg | ORAL_TABLET | Freq: Every day | ORAL | Status: DC
  Administered 2013-04-20 – 2013-04-26 (×7): 10 mg via ORAL
  Filled 2013-04-20 (×8): qty 1

## 2013-04-20 MED ORDER — QUETIAPINE FUMARATE ER 300 MG PO TB24
300.0000 mg | ORAL_TABLET | Freq: Every day | ORAL | Status: DC
  Administered 2013-04-20: 300 mg via ORAL
  Filled 2013-04-20 (×3): qty 1

## 2013-04-20 MED ORDER — ALUM & MAG HYDROXIDE-SIMETH 200-200-20 MG/5ML PO SUSP: 30.0000 mL | ORAL | Status: DC | PRN

## 2013-04-20 MED ORDER — PNEUMOCOCCAL VAC POLYVALENT 25 MCG/0.5ML IJ INJ: 0.5000 mL | INJECTION | INTRAMUSCULAR | Status: AC

## 2013-04-20 MED ORDER — ACETAMINOPHEN 325 MG PO TABS
650.0000 mg | ORAL_TABLET | Freq: Four times a day (QID) | ORAL | Status: DC | PRN
  Administered 2013-04-22 – 2013-04-25 (×2): 650 mg via ORAL

## 2013-04-20 MED ORDER — CLONAZEPAM 1 MG PO TABS
2.0000 mg | ORAL_TABLET | Freq: Two times a day (BID) | ORAL | Status: DC | PRN
  Administered 2013-04-20 – 2013-04-21 (×4): 2 mg via ORAL
  Filled 2013-04-20 (×4): qty 2

## 2013-04-20 NOTE — ED Notes (Signed)
Report called to Kathlene November, RN and Dr Dierdre Highman notified.

## 2013-04-20 NOTE — Progress Notes (Signed)
Patient ID: [name] [name], male   DOB: 03-20-1971, 42 y.o.   MRN: [id number]   D: Pt has been very flat and depressed on the unit, pt has also been responding to internal stimuli on the unit. Pt has not attended any groups nor has he engaged in treatment. Pt was in the bed most of the day sleeping. Pt was given his morning medication, he medication without any problems. Pt reported being negative SI/HI, no AH/VH noted. A: 15 min checks continued for patient safety. R: Pt safety maintained.

## 2013-04-20 NOTE — Progress Notes (Signed)
Pt in bathroom talking loud using profanity, and talking to people no seen by writer, and punching the walls.
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
## 2013-04-20 NOTE — BHH Suicide Risk Assessment (Signed)
Suicide Risk Assessment  Admission Assessment     Nursing information obtained from:    Demographic factors:    Current Mental Status:    Loss Factors:    Historical Factors:    Risk Reduction Factors:     CLINICAL FACTORS:   Dysthymia Schizophrenia:   Depressive state Paranoid or undifferentiated type  COGNITIVE FEATURES THAT CONTRIBUTE TO RISK:  Closed-mindedness Polarized thinking Thought constriction (tunnel vision)    SUICIDE RISK:   Moderate:  Frequent suicidal ideation with limited intensity, and duration, some specificity in terms of plans, no associated intent, good self-control, limited dysphoria/symptomatology, some risk factors present, and identifiable protective factors, including available and accessible social support.  PLAN OF CARE:  I certify that inpatient services furnished can reasonably be expected to improve the patient's condition.  [name] [name] 04/20/2013, 11:01 AM

## 2013-04-20 NOTE — ED Notes (Signed)
Thayer Ohm at [facility] [phone] will arrange transport for pt.

## 2013-04-20 NOTE — Tx Team (Signed)
Initial Interdisciplinary Treatment Plan  PATIENT STRENGTHS: (choose at least two) General fund of knowledge  PATIENT STRESSORS: Legal issue Medication change or noncompliance   PROBLEM LIST: Problem List/Patient Goals Date to be addressed Date deferred Reason deferred Estimated date of resolution  Psychosis 04/20/13     Risk for Suicide 04/20/13                                                DISCHARGE CRITERIA:  Ability to meet basic life and health needs Improved stabilization in mood, thinking, and/or behavior Need for constant or close observation no longer present  PRELIMINARY DISCHARGE PLAN: Participate in family therapy  PATIENT/FAMIILY INVOLVEMENT: This treatment plan has been presented to and reviewed with the patient, [name] [name].  The patient and family have been given the opportunity to ask questions and make suggestions.  Jacques Navy A 04/20/2013, 4:32 AM

## 2013-04-20 NOTE — Progress Notes (Signed)
D: Pt denies SI/HI. Pt reports hearing voices that he cannot make out what they are saying. Pt voiced no concerns about his meds. No adverse reactions to meds verbalized by pt. Pt attended evening group. Pt plan to d/c back home to his sister house. A: Verbal support given. Shift assessment completed. 15 minute checks performed for safety. R: Pt cooperative and compliant with treatment. Safety maintained.

## 2013-04-20 NOTE — Progress Notes (Addendum)
Patient ID: [name] [name], male   DOB: [birth year], 42 y.o.   MRN: [id number]  Admission Note:  D:42 yr male who presents IVC in no acute distress for the treatment of SI and psychosis. Pt appears flat and depressed. Pt was calm and cooperative with admission process. Pt denies SI/HI/AVH/Pain at this time. Pt is responding to internal stimuli , talking to people who writer cannot see. Pt has a cast on R hand where he broke his pinky playing with his brother in law.  A:Skin was assessed and found to be clear of any abnormal marks apart from a scar on shoulder, nack,  R-chest/ace.POC and unit policies explained and understanding verbalized. Consents obtained. Food and fluids offered, and fluids accepted.   R:Pt had no additional questions or concerns.

## 2013-04-20 NOTE — Progress Notes (Signed)
Patient ID: [name] [name], male   DOB: [birth year], 43 y.o.   MRN: [id number] Psychoeducational Group Note  Date:  04/20/2013 Time:0900am  Group Topic/Focus:  Identifying Needs:   The focus of this group is to help patients identify their personal needs that have been historically problematic and identify healthy behaviors to address their needs.  Participation Level:  Did Not Attend  Participation Quality:    Affect:  Cognitive:  Insight:  Engagement in Group:  Additional Comments:  Inventory group   Valente David 04/20/2013,10:52 AM

## 2013-04-20 NOTE — Progress Notes (Signed)
The focus of this group is to help patients review their daily goal of treatment and discuss progress on daily workbooks. Pt attended the evening group session and responded to discussion prompts from the Writer. Pt shared that he had a good day on the unit, the highlight of which was a visit from his family, whom he considers supportive. Pt reported having no additional needs from Nursing Staff this evening. Pt did share several times that he hoped to discharge either Sunday or Monday. The Writer observed the Pt talking to himself several times throughout group, though he did not respond to or acknowledge redirection during these conversations with himself. Pt's affect was flat.
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
## 2013-04-21 DIAGNOSIS — F200 Paranoid schizophrenia: Principal | ICD-10-CM

## 2013-04-21 MED ORDER — QUETIAPINE FUMARATE ER 400 MG PO TB24
400.0000 mg | ORAL_TABLET | Freq: Every day | ORAL | Status: DC
  Administered 2013-04-21: 400 mg via ORAL
  Filled 2013-04-21: qty 2
  Filled 2013-04-21 (×2): qty 1

## 2013-04-21 MED ORDER — BENZTROPINE MESYLATE 1 MG/ML IJ SOLN: 1.0000 mg | Freq: Two times a day (BID) | INTRAMUSCULAR | Status: DC | PRN

## 2013-04-21 MED ORDER — HALOPERIDOL 5 MG PO TABS
5.0000 mg | ORAL_TABLET | Freq: Two times a day (BID) | ORAL | Status: DC | PRN
  Administered 2013-04-21 (×2): 5 mg via ORAL
  Filled 2013-04-21 (×2): qty 1

## 2013-04-21 MED ORDER — QUETIAPINE FUMARATE ER 400 MG PO TB24: 400.0000 mg | ORAL_TABLET | Freq: Every day | ORAL | Status: DC

## 2013-04-21 MED ORDER — HALOPERIDOL 5 MG PO TABS
5.0000 mg | ORAL_TABLET | Freq: Once | ORAL | Status: AC
  Filled 2013-04-21: qty 1

## 2013-04-21 MED ORDER — HALOPERIDOL LACTATE 5 MG/ML IJ SOLN
5.0000 mg | Freq: Once | INTRAMUSCULAR | Status: AC
  Administered 2013-04-21: 5 mg via INTRAMUSCULAR
  Filled 2013-04-21 (×2): qty 1

## 2013-04-21 MED ORDER — HALOPERIDOL LACTATE 5 MG/ML IJ SOLN: 5.0000 mg | Freq: Two times a day (BID) | INTRAMUSCULAR | Status: DC | PRN

## 2013-04-21 MED ORDER — BENZTROPINE MESYLATE 1 MG PO TABS
1.0000 mg | ORAL_TABLET | Freq: Two times a day (BID) | ORAL | Status: DC | PRN
  Administered 2013-04-21 – 2013-04-22 (×2): 1 mg via ORAL
  Filled 2013-04-21: qty 1

## 2013-04-21 MED ORDER — HALOPERIDOL 5 MG PO TABS
ORAL_TABLET | ORAL | Status: AC
  Filled 2013-04-21: qty 1

## 2013-04-21 NOTE — [facility] Group Notes (Signed)
Adult Psychoeducational Group Note  Date:  04/21/2013 Time:  4:30 PM  Group Topic/Focus:  Making Healthy Choices:   The focus of this group is to help patients identify negative/unhealthy choices they were using prior to admission and identify positive/healthier coping strategies to replace them upon discharge.  Participation Level:  Active  Participation Quality:  Attentive  Affect:  Appropriate  Cognitive:  Appropriate  Insight: Good  Engagement in Group:  Engaged  Modes of Intervention:  Exploration and Socialization  Additional Comments:  Pt participated during group and was engaged during the group exercise/game.  Tania Ade 04/21/2013, 4:30 PM

## 2013-04-21 NOTE — [facility] Group Notes (Signed)
[facility] Group Notes:  (Nursing/MHT/Case Management/Adjunct)  Date:  04/21/2013  Time:  11:42 AM  Type of Therapy:  Psychoeducational Skills  Participation Level:  Active  Participation Quality:  Appropriate  Affect:  Appropriate  Cognitive:  Appropriate  Insight:  Appropriate  Engagement in Group:  Engaged  Modes of Intervention:  Problem-solving  Summary of Progress/Problems: Pt attended healthy coping skills group, pt also engaged in off the unit activity.   Jacquelyne Balint Shanta 04/21/2013, 11:42 AM

## 2013-04-21 NOTE — Progress Notes (Signed)
This writer alerted by MHT at 2300 that pt was in room speaking to self, cursing, agitated. When approached by this writer, pt states, "no I'm fine" but agreeable to take prn klonopin. Given without incident. Pt slightly calmer however continued to curse to self still denying hallucinations. Observed in bathroom close to an hour with this behavior. Around 0100 pt overheard punching wall so loudly that it could be heard throughout the hallway. Disruptive to roommate who is attempting to sleep. MD paged and order received to admin haldol 10mg  which was given IM. Pt willing and cooperative with admin. Denied punching wall. No sign of injury however R hand is casted. On reassess at 0200, pt asleep. Will continue to monitor closely. Lawrence Marseilles

## 2013-04-21 NOTE — Progress Notes (Signed)
Patient ID: Kynan Peasley, male   DOB: 1970-09-05, 42 y.o.   MRN: [id number] Psychoeducational Group Note  Date:  04/21/2013 Time:  0930am  Group Topic/Focus:  Making Healthy Choices:   The focus of this group is to help patients identify negative/unhealthy choices they were using prior to admission and identify positive/healthier coping strategies to replace them upon discharge.  Participation Level:  Minimal  Participation Quality:  Appropriate  Affect:  Appropriate  Cognitive:  Appropriate  Insight:  Lacking  Engagement in Group:  Lacking  Additional Comments:  Inventory group- pt came in late   Valente David 04/21/2013,10:21 AM

## 2013-04-21 NOTE — Progress Notes (Signed)
The focus of this group is to help patients review their daily goal of treatment and discuss progress on daily workbooks. Pt attended the evening group session and responded to discussion prompts from the Writer. Pt shared that he had a good day on the unit. On the topic of his possible discharge tomorrow, Pt reported feeling well enough to go home and ready to do so. Pt reported wanting to shave and do laundry following group, which he was allowed to do. Pt's affect in group appeared distracted and the Writer observed the Pt speaking quietly to himself several times throughout wrap-up.

## 2013-04-21 NOTE — [facility] Group Notes (Signed)
[facility] Group Notes:  (Clinical Social Work)  04/21/2013   11:15am-12:00pm  Summary of Progress/Problems:  The main focus of today's process group was to listen to a variety of genres of music and to identify that different types of music provoke different responses.  The patient then was able to identify personally what was soothing for them, as well as energizing.  Handouts were used to record feelings evoked, as well as how patient can personally use this knowledge in sleep habits, with depression, and with other symptoms.  The patient expressed understanding of concepts, as well as knowledge of how each type of music affected them and how this can be used when they are at home as a tool in their recovery.  Tishawn had little insight and elected not to participate in the discussion.    Type of Therapy:  Music Therapy   Participation Level:  Active  Participation Quality:  Minimal, Uninterested, distracted by internal stimuli, laughing inappropriately  Affect:  Not ongruent  Cognitive:  Hallucinations  Insight:  Limited  Engagement in Therapy:  Limited  Modes of Intervention:   Activity, Exploration  Ambrose Mantle, LCSW 04/21/2013, 12:52 PM

## 2013-04-21 NOTE — Progress Notes (Signed)
[facility] MD Progress Note  04/21/2013 11:38 AM [name] [name]  MRN:  [id number] Subjective:   Patient states "I'm good. Not hearing voices. Slept fine. I'm leaving tomorrow because the police are coming to get me." When asked patient if anything happened overnight such as incidence of aggression patient denied.   Objective:  Patient observed sitting in the dayroom quietly. MD on call notified this morning at 1 am that the patient had become very agitated and was punching the walls in his room and screaming loudly. His roommate was moved to another room because of this behavior. Today on the unit patient has been observed talking to himself and responding to internal stimuli. Patient remains actively psychotic.   Diagnosis:   DSM5:  Axis I: Chronic Paranoid Schizophrenia Axis II: Deferred Axis III:  Past Medical History  Diagnosis Date  . Schizophrenia   . Hypertension   . Pneumonia     had aspiration pneumonia 5/14-on vent   Axis IV: other psychosocial or environmental problems and problems with access to health care services Axis V: 31-40 impairment in reality testing  ADL's:  Impaired  Sleep: Poor  Appetite:  Fair  Suicidal Ideation:  Denies Homicidal Ideation:  Denies AEB (as evidenced by):  Psychiatric Specialty Exam: Review of Systems  Constitutional: Negative.   HENT: Negative.   Eyes: Negative.   Respiratory: Negative.   Cardiovascular: Negative.   Gastrointestinal: Negative.   Genitourinary: Negative.   Musculoskeletal: Negative.   Skin: Negative.   Neurological: Negative.   Endo/Heme/Allergies: Negative.   Psychiatric/Behavioral: Positive for depression and hallucinations. Negative for suicidal ideas and substance abuse. The patient is nervous/anxious.     Blood pressure 130/83, pulse 98, temperature 99 F (37.2 C), temperature source Oral, resp. rate 18, height 6\' 1"  (1.854 m), weight 127.914 kg (282 lb).Body mass index is 37.21 kg/(m^2).  General Appearance:  Disheveled  Eye Solicitor::  Fair  Speech:  Garbled and Slow  Volume:  Decreased  Mood:  Dysphoric  Affect:  Blunt  Thought Process:  Disorganized  Orientation:  Full (Time, Place, and Person)  Thought Content:  Hallucinations: Auditory  Suicidal Thoughts:  No  Homicidal Thoughts:  No  Memory:  Immediate;   Poor Recent;   Poor Remote;   Poor  Judgement:  Impaired  Insight:  Lacking  Psychomotor Activity:  Increased and Restlessness  Concentration:  Poor  Recall:  Poor  Akathisia:  No  Handed:  Right  AIMS (if indicated):     Assets:  Communication Skills Desire for Improvement Physical Health Resilience Social Support  Sleep:  Number of Hours: 2.75   Current Medications: Current Facility-Administered Medications  Medication Dose Route Frequency Provider Last Rate Last Dose  . acetaminophen (TYLENOL) tablet 650 mg  650 mg Oral Q6H PRN [name], PA-C      . alum & mag hydroxide-simeth (MAALOX/MYLANTA) 200-200-20 MG/5ML suspension 30 mL  30 mL Oral Q4H PRN [name], PA-C      . benztropine (COGENTIN) tablet 1 mg  1 mg Oral QHS [name], PA-C   1 mg at 04/20/13 2112  . benztropine (COGENTIN) tablet 1 mg  1 mg Oral BID PRN [name], NP       Or  . benztropine mesylate (COGENTIN) injection 1 mg  1 mg Intramuscular BID PRN [name], NP      . clonazePAM Scarlette Calico) tablet 2 mg  2 mg Oral BID PRN [name], PA-C   2 mg at 04/20/13  2311  . haloperidol (HALDOL) 5 MG tablet           . haloperidol (HALDOL) tablet 5 mg  5 mg Oral BID PRN [name], NP       Or  . haloperidol lactate (HALDOL) injection 5 mg  5 mg Intramuscular BID PRN [name], NP      . lisinopril (PRINIVIL,ZESTRIL) tablet 10 mg  10 mg Oral Daily [name], PA-C   10 mg at 04/21/13 1013  . magnesium hydroxide (MILK OF MAGNESIA) suspension 30 mL  30 mL Oral Daily PRN [name], PA-C      . pneumococcal 23 valent vaccine (PNU-IMMUNE) injection 0.5 mL  0.5 mL Intramuscular  Tomorrow-1000 [name]      . QUEtiapine (SEROQUEL XR) 24 hr tablet 400 mg  400 mg Oral QHS [name], NP        Lab Results: No results found for this or any previous visit (from the past 48 hour(s)).  Physical Findings: AIMS: Facial and Oral Movements Muscles of Facial Expression: None, normal Lips and Perioral Area: None, normal Jaw: None, normal Tongue: None, normal,Extremity Movements Upper (arms, wrists, hands, fingers): None, normal Lower (legs, knees, ankles, toes): None, normal, Trunk Movements Neck, shoulders, hips: None, normal, Overall Severity Severity of abnormal movements (highest score from questions above): None, normal Incapacitation due to abnormal movements: None, normal Patient's awareness of abnormal movements (rate only patient's report): No Awareness, Dental Status Current problems with teeth and/or dentures?: No Does patient usually wear dentures?: No  CIWA:  CIWA-Ar Total: 0 COWS:  COWS Total Score: 1  Treatment Plan Summary: Daily contact with patient to assess and evaluate symptoms and progress in treatment Medication management  Plan: Continue crisis management and stabilization.  Medication management: Increase Seroquel XR to 400 mg at q pm. Start Haldol 5 mg bid po/IM prn agitation/psychosis and Cogentin 1 mg po/IM bid prn EPS. Consulted with Dr. [name] for medication management for this acutely psychotic patient.  Encouraged patient to attend groups and participate in group counseling sessions and activities.  Discharge plan in progress.  Continue current treatment plan.  Address health issues: Vitals reviewed and stable. Patient has cast to right arm with intact sensation and capillary refill.   Medical Decision Making Problem Points:  Established problem, worsening (2) and Review of psycho-social stressors (1) Data Points:  Review of medication regiment & side effects (2)  I certify that inpatient services furnished can reasonably be  expected to improve the patient's condition.   [name], [name] NP-C 04/21/2013, 11:38 AM I agreed with findings and treatment plan of this patient

## 2013-04-21 NOTE — Progress Notes (Addendum)
Patient ID: [name] [name], male   DOB: 12-04-1970, 42 y.o.   MRN: [id number]  D: Pt has been very flat and depressed on the unit, pt has also been responding to internal stimuli on the unit. Pt has not attended any groups nor has he engaged in treatment. Pt was in the bed most of the day sleeping. Pt was given his morning medication, he medication without any problems. Pt was also heard hitting the wall in his room, and very agitated. Pt was given Haldol, Klonopin, and Cogentin. Pt reported being negative SI/HI, no AH/VH noted. A: 15 min checks continued for patient safety. R: Pt safety maintained.

## 2013-04-22 MED ORDER — QUETIAPINE FUMARATE 200 MG PO TABS
ORAL_TABLET | ORAL | Status: AC
  Administered 2013-04-22: 200 mg
  Filled 2013-04-22: qty 1

## 2013-04-22 MED ORDER — HALOPERIDOL 5 MG PO TABS
5.0000 mg | ORAL_TABLET | Freq: Two times a day (BID) | ORAL | Status: DC
  Administered 2013-04-22 – 2013-04-23 (×2): 5 mg via ORAL
  Filled 2013-04-22 (×4): qty 1

## 2013-04-22 MED ORDER — OLANZAPINE 10 MG PO TBDP
10.0000 mg | ORAL_TABLET | Freq: Three times a day (TID) | ORAL | Status: DC | PRN
  Administered 2013-04-22 – 2013-04-24 (×4): 10 mg via ORAL
  Filled 2013-04-22 (×4): qty 1

## 2013-04-22 MED ORDER — DIVALPROEX SODIUM 500 MG PO DR TAB
500.0000 mg | DELAYED_RELEASE_TABLET | Freq: Two times a day (BID) | ORAL | Status: DC
  Administered 2013-04-22 – 2013-04-26 (×8): 500 mg via ORAL
  Filled 2013-04-22 (×10): qty 1

## 2013-04-22 MED ORDER — LORAZEPAM 1 MG PO TABS
1.0000 mg | ORAL_TABLET | Freq: Four times a day (QID) | ORAL | Status: DC | PRN
  Administered 2013-04-22 – 2013-04-26 (×8): 1 mg via ORAL
  Filled 2013-04-22 (×8): qty 1

## 2013-04-22 MED ORDER — QUETIAPINE FUMARATE 200 MG PO TABS
200.0000 mg | ORAL_TABLET | ORAL | Status: AC
Start: 2013-04-22 — End: 2013-04-22
  Filled 2013-04-22: qty 1

## 2013-04-22 NOTE — Progress Notes (Signed)
Adult Psychoeducational Group Note  Date:  04/22/2013 Time:  11:00AM Group Topic/Focus:  Wellness Toolbox:   The focus of this group is to discuss various aspects of wellness, balancing those aspects and exploring ways to increase the ability to experience wellness.  Patients will create a wellness toolbox for use upon discharge.  Participation Level:  Active  Participation Quality:  Appropriate and Attentive  Affect:  Appropriate  Cognitive:  Alert and Appropriate  Insight: Appropriate  Engagement in Group:  Engaged  Modes of Intervention:  Discussion  Additional Comments:  Pt. Was attentive and appropriate during today's group discussion. Pt was able to complete self care assessment. Pt. Was able to sit in group and listen to peers discuss how they going to improve there physical and psychological self care.   Bing Plume D 04/22/2013, 3:22 PM

## 2013-04-22 NOTE — Progress Notes (Signed)
D: Patient appeared very loud and agitated at the beginning of the shift. He was pacing the hallway and cursing everything/ evryone and making word salad. He appeared to be actively hallucinating. A: Writer offered patient PRN Zyprexa and redirected patient. R: Patient received this medication without difficult and appeared calm shortly after that. He went to his room and rested for the remaining part of the evening. Patient later came to window for HS medications. He reported that he would be going home tomorrow to stay with his sister.

## 2013-04-22 NOTE — Tx Team (Signed)
  Interdisciplinary Treatment Plan Update   Date Reviewed:  04/22/2013  Time Reviewed:  8:13 AM  Progress in Treatment:   Attending groups: Yes Participating in groups: Yes Taking medication as prescribed: Yes  Tolerating medication: Yes Family/Significant other contact made: No Patient understands diagnosis: Yes  As evidenced by asking for help with hearing voices  Discussing patient identified problems/goals with staff: Yes  See intial care plan Medical problems stabilized or resolved: Yes Denies suicidal/homicidal ideation: Yes  In tx team Patient has not harmed self or others: Yes  For review of initial/current patient goals, please see plan of care.  Estimated Length of Stay:  4-5 days  Reason for Continuation of Hospitalization: Delusions  Hallucinations Medication stabilization  New Problems/Goals identified:  N/A  Discharge Plan or Barriers:   return home, follow up outpt  Additional Comments:  This is a 42yo African American male who is hospitalized with increasingly bizarre behaviors regardless of being on his medication regularly, administered by his sister who is his legal guardian, and with whom he lives. 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 He used to be served by a Conservation officer, historic buildings, and now is going to [place] for medication management only. He is endorsing auditory hallucinations, is making homicidal statements, and sister is concerned about him returning to live there since she has 3 children in the home.   Attendees:  Signature: Thedore Mins, MD 04/22/2013 8:13 AM   Signature: Richelle Ito, LCSW 04/22/2013 8:13 AM  Signature: [name], NP 04/22/2013 8:13 AM  Signature: Joslyn Devon, RN 04/22/2013 8:13 AM  Signature: Liborio Nixon, RN 04/22/2013 8:13 AM  Signature:  04/22/2013 8:13 AM  Signature:   04/22/2013 8:13 AM  Signature:    Signature:     Signature:    Signature:    Signature:    Signature:      Scribe for Treatment Team:   Richelle Ito, LCSW  04/22/2013 8:13 AM

## 2013-04-22 NOTE — Progress Notes (Signed)
Patient ID: [name] [name], male   DOB: 09-30-1970, 42 y.o.   MRN: [id number] D. The patient was restless all evening and observed talking to himself. When asked, denied hearing voices. Thoughts are disorganized and tangential. Unable to settle for sleep. Pacing in room, yelling and hitting the wall. Denied a/v hallucinations, but he was arguing and getting agitated speaking to someone that was not there. Stated he was trying to figure out who he was. A. D.O.C. was notified and medication orders obtained and administrated. R. The patient is compliant with medication. After taking now dose of Seroquel, the patient was able to relax in bed.

## 2013-04-22 NOTE — Progress Notes (Signed)
Patient ID: [name] [name], male   DOB: 1971-06-24, 42 y.o.   MRN: [id number] [facility] MD Progress Note  04/22/2013 11:45 AM [name] [name]  MRN:  [id number] Subjective: "I want to be discharged back home to my sister. I think my medication is good now." Objective: Patient with bizarre behavior and disorganized thinking. He has no insight into his problem, does not know why he is admitted to the hospital and  gets very easily agitated and irritable.  He has been observed by the staffs talking to himself, heard punching the wall in his room and screaming out loud. He remains psychotic and delusional. He says that the only medication that helped him in the past was Haldol Diagnosis:   DSM5:  Axis I: Chronic Paranoid Schizophrenia Axis II: Deferred Axis III:  Past Medical History  Diagnosis Date  . Hypertension   . Pneumonia     had aspiration pneumonia 5/14-on vent   Axis IV: other psychosocial or environmental problems and problems with access to health care services Axis V: 31-40 impairment in reality testing  ADL's:  Impaired  Sleep: Poor  Appetite:  Fair  Suicidal Ideation:  Denies Homicidal Ideation:  Denies AEB (as evidenced by):  Psychiatric Specialty Exam: Review of Systems  Constitutional: Negative.   HENT: Negative.   Eyes: Negative.   Respiratory: Negative.   Cardiovascular: Negative.   Gastrointestinal: Negative.   Genitourinary: Negative.   Musculoskeletal: Negative.   Skin: Negative.   Neurological: Negative.   Endo/Heme/Allergies: Negative.   Psychiatric/Behavioral: Positive for depression and hallucinations. Negative for suicidal ideas and substance abuse. The patient is nervous/anxious.     Blood pressure 124/81, pulse 106, temperature 98.1 F (36.7 C), temperature source Oral, resp. rate 18, height 6\' 1"  (1.854 m), weight 127.914 kg (282 lb).Body mass index is 37.21 kg/(m^2).  General Appearance: Disheveled  Eye Solicitor::  Fair  Speech:  Garbled and Slow   Volume:  Decreased  Mood:  Dysphoric  Affect:  Blunt  Thought Process:  Disorganized  Orientation:  Full (Time, Place, and Person)  Thought Content:  Hallucinations: Auditory  Suicidal Thoughts:  No  Homicidal Thoughts:  No  Memory:  Immediate;   Poor Recent;   Poor Remote;   Poor  Judgement:  Impaired  Insight:  Lacking  Psychomotor Activity:  Increased and Restlessness  Concentration:  Poor  Recall:  Poor  Akathisia:  No  Handed:  Right  AIMS (if indicated):     Assets:  Communication Skills Desire for Improvement Physical Health Resilience Social Support  Sleep:  Number of Hours: 1.5   Current Medications: Current Facility-Administered Medications  Medication Dose Route Frequency Provider Last Rate Last Dose  . acetaminophen (TYLENOL) tablet 650 mg  650 mg Oral Q6H PRN [name], PA-C   650 mg at 04/22/13 1047  . alum & mag hydroxide-simeth (MAALOX/MYLANTA) 200-200-20 MG/5ML suspension 30 mL  30 mL Oral Q4H PRN [name], PA-C      . benztropine (COGENTIN) tablet 1 mg  1 mg Oral QHS [name], PA-C   1 mg at 04/21/13 2158  . benztropine (COGENTIN) tablet 1 mg  1 mg Oral BID PRN [name], NP   1 mg at 04/21/13 1343   Or  . benztropine mesylate (COGENTIN) injection 1 mg  1 mg Intramuscular BID PRN [name], NP      . clonazePAM Scarlette Calico) tablet 2 mg  2 mg Oral BID PRN [name], PA-C   2 mg at  04/21/13 2203  . haloperidol (HALDOL) tablet 5 mg  5 mg Oral BID PRN [name], NP   5 mg at 04/21/13 2202   Or  . haloperidol lactate (HALDOL) injection 5 mg  5 mg Intramuscular BID PRN [name], NP      . haloperidol (HALDOL) tablet 5 mg  5 mg Oral BID Berle Fitz      . lisinopril (PRINIVIL,ZESTRIL) tablet 10 mg  10 mg Oral Daily [name], PA-C   10 mg at 04/22/13 1610  . LORazepam (ATIVAN) tablet 1 mg  1 mg Oral Q6H PRN Thresa Ross, MD      . magnesium hydroxide (MILK OF MAGNESIA) suspension 30 mL  30 mL Oral Daily PRN [name], PA-C        Lab Results: No results found for this or any previous visit (from the past 48 hour(s)).  Physical Findings: AIMS: Facial and Oral Movements Muscles of Facial Expression: None, normal Lips and Perioral Area: None, normal Jaw: None, normal Tongue: None, normal,Extremity Movements Upper (arms, wrists, hands, fingers): None, normal Lower (legs, knees, ankles, toes): None, normal, Trunk Movements Neck, shoulders, hips: None, normal, Overall Severity Severity of abnormal movements (highest score from questions above): None, normal Incapacitation due to abnormal movements: None, normal Patient's awareness of abnormal movements (rate only patient's report): No Awareness, Dental Status Current problems with teeth and/or dentures?: No Does patient usually wear dentures?: No  CIWA:  CIWA-Ar Total: 0 COWS:  COWS Total Score: 1  Treatment Plan Summary: Daily contact with patient to assess and evaluate symptoms and progress in treatment Medication management  Plan: Continue crisis management and stabilization.  Medication management:  Start Haldol 5 mg bid po/IM prn agitation/psychosis and Cogentin 1 mg po/IM bid prn EPS.  Add Depakote 500mg  po BID for mood lability and agitation. Encouraged patient to attend groups and participate in group counseling sessions and activities.  Discharge plan in progress.  Continue current treatment plan.  Address health issues: Vitals reviewed and stable. Patient has cast to right arm with intact sensation and capillary refill.   Medical Decision Making Problem Points:  Established problem, worsening (2) and Review of psycho-social stressors (1) Data Points:  Review of medication regiment & side effects (2)  I certify that inpatient services furnished can reasonably be expected to improve the patient's condition.   Thedore Mins, MD 04/22/2013, 11:45 AM

## 2013-04-22 NOTE — [facility] Group Notes (Signed)
[facility] LCSW Group Therapy  04/22/2013 1:15 pm  Type of Therapy: Process Group Therapy  Participation Level:  Came for 5 minutes.  Left, and did not return.    Summary of Progress/Problems: Today's group addressed the issue of overcoming obstacles.  Patients were asked to identify their biggest obstacle post d/c that stands in the way of their on-going success, and then problem solve as to how to manage this.  Daryel Gerald B 04/22/2013   4:31 PM

## 2013-04-22 NOTE — Progress Notes (Signed)
Recreation Therapy Notes  Date: 10.13.2014 Time: 9:30am Location: [address] Dayroom  Group Topic: Leisure Education  Goal Area(s) Addresses:  Patient will verbalize activity of interest by end of group session. Patient will verbalize the ability to use positive leisure/recreation as a coping mechanism.  Behavioral Response: Hallucinating, Appropriate.   Intervention: Game  Activity: Patients were asked to select a letter of the alphabet, state an activity that starts with that letter and then identify an emotion they experience when participating in selected activity.   Education:  Leisure Education, Pharmacologist, Building control surveyor.   Education Outcome: Acknowledges understanding  Clinical Observations/Feedback: Patient participated in group activity, however he was unable to identify emotions. Patient stated he was not able to remember the chart from prison that told him what emotions are. Patient was able to identify appropriate activities to correspond with selected letters. Patient was observed to be in conversation with someone throughout group session, however no other group member was engaged in conversation with patient.   Marykay Lex Esli Clements, LRT/CTRS  Avonne Berkery L 04/22/2013 2:16 PM

## 2013-04-22 NOTE — [facility] Group Notes (Signed)
[facility] LCSW Aftercare Discharge Planning Group Note   04/22/2013 8:13 AM  Participation Quality:  Minimal  Mood/Affect:  Blunted and Irritable  Depression Rating:  denies  Anxiety Rating:  denies  Thoughts of Suicide:  No Will you contract for safety?   NA  Current AVH:  Denies, but appears to be RIS  Plan for Discharge/Comments:  As I started the group, [name] [name] was talking at the same time.  I asked him to please stop, and he did, only to resume about 5 minutes later.  At that point I just ignored him, as were the rest of the group members.  He was called out to see the Dr, but later returned.  States he was hearing voices and walked to his sister's home.  The police became at some point and brought him for an assessment.  He is focused on getting home today.  States he goes to [place] for outpt therapy.  Stated we could call his sister for collateral, but then told me he does not have a phone number.  Transportation Means:  bus  Supports:  sister  Ida Rogue

## 2013-04-22 NOTE — Progress Notes (Signed)
Pt was yelling talking to someone not visible to writer, pt was given 1 mg Ativan.

## 2013-04-23 MED ORDER — BENZTROPINE MESYLATE 1 MG PO TABS
1.0000 mg | ORAL_TABLET | Freq: Two times a day (BID) | ORAL | Status: DC
  Administered 2013-04-23 – 2013-04-26 (×6): 1 mg via ORAL
  Filled 2013-04-23 (×10): qty 1

## 2013-04-23 MED ORDER — HALOPERIDOL 5 MG PO TABS
10.0000 mg | ORAL_TABLET | Freq: Two times a day (BID) | ORAL | Status: DC
  Administered 2013-04-23 – 2013-04-26 (×6): 10 mg via ORAL
  Filled 2013-04-23 (×9): qty 2

## 2013-04-23 NOTE — Progress Notes (Signed)
D: Pt denies SI/HI/AVH. Pt has poor insight and is unaware of any visual or auditory hallucination. Writer heard pt from one end of the hall to the other, yelling and laughing out loud. Writer went to check on pt. Pt was in the restroom looking in the mirror talking to himself as if someone else was present that he was communicating with. Pt compliant with attending groups and taking meds. A: Medications administered as ordered per MD. Verbal support given. Pt encouraged to attend groups. 15 minute checks performed for safety. R: No complaints verbalized by pt. Safety maintained.

## 2013-04-23 NOTE — [facility] Group Notes (Signed)
[facility] LCSW Group Therapy  04/23/2013 , 4:50 PM   Type of Therapy:  Group Therapy  Participation Level:  Attended initially, and interacted.  Began to get agitated, and left.    Summary of Progress/Problems: Today's group focused on the term Diagnosis.  Participants were asked to define the term, and then pronounce whether it is a negative, positive or neutral term.  [name] [name] B 04/23/2013 , 4:50 PM

## 2013-04-23 NOTE — [facility] Group Notes (Signed)
Adult Psychoeducational Group Note  Date:  04/23/2013 Time:  8:59 PM  Group Topic/Focus:  Wrap-Up Group:   The focus of this group is to help patients review their daily goal of treatment and discuss progress on daily workbooks.  Participation Level:  Did Not Attend  Participation Quality:  None  Affect:  None  Cognitive:  None  Insight: None  Engagement in Group:  None  Modes of Intervention:  Discussion  Additional Comments:  Deandre did not attend group.  Caroll Rancher A 04/23/2013, 8:59 PM

## 2013-04-23 NOTE — Progress Notes (Signed)
The focus of this group is to help patients review their daily goal of treatment and discuss progress on daily workbooks. Pt attended the evening group session and responded to all discussion prompts from the Writer. Pt shared that today was an okay day on the unit, the highlight of which was finding out he might go home tomorrow. Pt reported having no additional needs from Nursing Staff this evening and no goals for the week other than discharging home. Pt's affect was flat.

## 2013-04-23 NOTE — Progress Notes (Signed)
Pt continues to respond to internal stimuli. Pt angry with himself and continues to argue with himself. When approached by staff, pt will not respond as if he cannot hear staff talking to him. Within minutes pt will snap out of his conversation with himself and become aware of his surroundings. Pt given prn med ativan to help minimize hallucinations.

## 2013-04-23 NOTE — Progress Notes (Signed)
Patient ID: [name] [name], male   DOB: 1970-12-25, 42 y.o.   MRN: [id number]   [facility] MD Progress Note  04/23/2013 1:48 PM [name] [name]  MRN:  [id number] Subjective: Patient states "My sister is coming to visit tomorrow. I feel ready to go home."   Objective: Patient with bizarre behavior and disorganized thinking. He has no insight into his problem, does not know why he is admitted to the hospital and gets very easily agitated and irritable. Nursing notes indicate that the patient continues to be agitated, pacing the hallway yesterday evening and cursing at people on the unit. The patient was noted to be responding to internal stimuli. He continues to remain fixated that he is leaving each day and tells the staff this. Writer explained to him that he cannot be discharged until his symptoms have stabilized due to the reports from staff that he continues to appear psychotic and agitated.  Diagnosis:   DSM5:  Axis I: Chronic Paranoid Schizophrenia Axis II: Deferred Axis III:  Past Medical History  Diagnosis Date  . Hypertension   . Pneumonia     had aspiration pneumonia 5/14-on vent   Axis IV: other psychosocial or environmental problems and problems with access to health care services Axis V: 31-40 impairment in reality testing  ADL's:  Impaired  Sleep: Poor  Appetite:  Fair  Suicidal Ideation:  Denies Homicidal Ideation:  Denies AEB (as evidenced by):  Psychiatric Specialty Exam: Review of Systems  Constitutional: Negative.   HENT: Negative.   Eyes: Negative.   Respiratory: Negative.   Cardiovascular: Negative.   Gastrointestinal: Negative.   Genitourinary: Negative.   Musculoskeletal: Negative.   Skin: Negative.   Neurological: Negative.   Endo/Heme/Allergies: Negative.   Psychiatric/Behavioral: Positive for depression and hallucinations. Negative for suicidal ideas and substance abuse. The patient is nervous/anxious.     Blood pressure 137/90, pulse 98, temperature 98  F (36.7 C), temperature source Oral, resp. rate 20, height 6\' 1"  (1.854 m), weight 127.914 kg (282 lb).Body mass index is 37.21 kg/(m^2).  General Appearance: Disheveled  Eye Solicitor::  Fair  Speech:  Garbled and Slow  Volume:  Decreased  Mood:  Dysphoric  Affect:  Blunt  Thought Process:  Disorganized  Orientation:  Full (Time, Place, and Person)  Thought Content:  Hallucinations: Auditory  Suicidal Thoughts:  No  Homicidal Thoughts:  No  Memory:  Immediate;   Poor Recent;   Poor Remote;   Poor  Judgement:  Impaired  Insight:  Lacking  Psychomotor Activity:  Increased and Restlessness  Concentration:  Poor  Recall:  Poor  Akathisia:  No  Handed:  Right  AIMS (if indicated):     Assets:  Communication Skills Desire for Improvement Physical Health Resilience Social Support  Sleep:  Number of Hours: 6.75   Current Medications: Current Facility-Administered Medications  Medication Dose Route Frequency Provider Last Rate Last Dose  . acetaminophen (TYLENOL) tablet 650 mg  650 mg Oral Q6H PRN [name], PA-C   650 mg at 04/22/13 1047  . alum & mag hydroxide-simeth (MAALOX/MYLANTA) 200-200-20 MG/5ML suspension 30 mL  30 mL Oral Q4H PRN [name], PA-C      . benztropine (COGENTIN) tablet 1 mg  1 mg Oral QHS [name], PA-C   1 mg at 04/22/13 2200  . benztropine (COGENTIN) tablet 1 mg  1 mg Oral BID PRN [name], NP   1 mg at 04/22/13 2138   Or  . benztropine mesylate (COGENTIN) injection  1 mg  1 mg Intramuscular BID PRN [name], NP      . divalproex (DEPAKOTE) DR tablet 500 mg  500 mg Oral BID PC [name]   500 mg at 04/23/13 0937  . haloperidol (HALDOL) tablet 5 mg  5 mg Oral BID [name]   5 mg at 04/23/13 0933  . lisinopril (PRINIVIL,ZESTRIL) tablet 10 mg  10 mg Oral Daily [name], PA-C   10 mg at 04/23/13 0933  . LORazepam (ATIVAN) tablet 1 mg  1 mg Oral Q6H PRN Thresa Ross, MD   1 mg at 04/22/13 2138  . magnesium hydroxide  (MILK OF MAGNESIA) suspension 30 mL  30 mL Oral Daily PRN [name], PA-C      . OLANZapine zydis (ZYPREXA) disintegrating tablet 10 mg  10 mg Oral Q8H PRN [name]   10 mg at 04/22/13 1936    Lab Results: No results found for this or any previous visit (from the past 48 hour(s)).  Physical Findings: AIMS: Facial and Oral Movements Muscles of Facial Expression: None, normal Lips and Perioral Area: None, normal Jaw: None, normal Tongue: None, normal,Extremity Movements Upper (arms, wrists, hands, fingers): None, normal Lower (legs, knees, ankles, toes): None, normal, Trunk Movements Neck, shoulders, hips: None, normal, Overall Severity Severity of abnormal movements (highest score from questions above): None, normal Incapacitation due to abnormal movements: None, normal Patient's awareness of abnormal movements (rate only patient's report): No Awareness, Dental Status Current problems with teeth and/or dentures?: No Does patient usually wear dentures?: No  CIWA:  CIWA-Ar Total: 0 COWS:  COWS Total Score: 1  Treatment Plan Summary: Daily contact with patient to assess and evaluate symptoms and progress in treatment Medication management  Plan: Continue crisis management and stabilization.  Medication management:  Increase Haldol to 10 mg BID for continued psychosis. Change Cogentin to 1 mg BID for EPS prevention.  Add Depakote 500mg  po BID for mood lability and agitation. Encouraged patient to attend groups and participate in group counseling sessions and activities.  Discharge plan in progress. Sister to visit tomorrow to help determine if the patient is at his baseline.  Continue current treatment plan.  Address health issues: Vitals reviewed and stable. Patient has cast to right arm with intact sensation and capillary refill.   Medical Decision Making Problem Points:  Established problem, worsening (2) and Review of psycho-social stressors (1) Data Points:  Review  of medication regiment & side effects (2)  I certify that inpatient services furnished can reasonably be expected to improve the patient's condition.   [name], NP-C 04/23/2013, 1:48 PM

## 2013-04-24 MED ORDER — DIPHENHYDRAMINE HCL 50 MG/ML IJ SOLN
50.0000 mg | Freq: Once | INTRAMUSCULAR | Status: AC
  Administered 2013-04-24: 50 mg via INTRAMUSCULAR
  Filled 2013-04-24: qty 1

## 2013-04-24 MED ORDER — HALOPERIDOL DECANOATE 100 MG/ML IM SOLN
100.0000 mg | Freq: Once | INTRAMUSCULAR | Status: AC
  Administered 2013-04-24: 100 mg via INTRAMUSCULAR
  Filled 2013-04-24: qty 1

## 2013-04-24 NOTE — Progress Notes (Signed)
Seen and agreed. Marti Mclane, MD 

## 2013-04-24 NOTE — Progress Notes (Signed)
Patient ID: [name] [name], male   DOB: 1971-04-15, 42 y.o.   MRN: [id number]   [facility] MD Progress Note  04/24/2013 11:20 AM [name] [name]  MRN:  [id number] Subjective:" I want my sister to come and get me out of here."   Objective: Patient's behavior remains bizarre behavior and his thought process disorganized. Staff reports that patient has been getting irritated very easily and exhibiting out of control behavior such as cursing people out, punching wall and making loud, bizarre noises. He is preoccupied with being discharged to his sister but he remains delusional, psychotic and has been observed talking to himself numerous times. Patient is compliant with his medications and has not endorsed any adverse reactions.   Diagnosis:   DSM5:  Axis I: Chronic Paranoid Schizophrenia Axis II: Deferred Axis III:  Past Medical History  Diagnosis Date  . Hypertension   . Pneumonia     had aspiration pneumonia 5/14-on vent   Axis IV: other psychosocial or environmental problems and problems with access to health care services Axis V: 31-40 impairment in reality testing  ADL's:  Impaired  Sleep: Poor  Appetite:  Fair  Suicidal Ideation:  Denies Homicidal Ideation:  Denies AEB (as evidenced by):  Psychiatric Specialty Exam: Review of Systems  Constitutional: Negative.   HENT: Negative.   Eyes: Negative.   Respiratory: Negative.   Cardiovascular: Negative.   Gastrointestinal: Negative.   Genitourinary: Negative.   Musculoskeletal: Negative.   Skin: Negative.   Neurological: Negative.   Endo/Heme/Allergies: Negative.   Psychiatric/Behavioral: Positive for depression and hallucinations. Negative for suicidal ideas and substance abuse. The patient is nervous/anxious.     Blood pressure 116/79, pulse 98, temperature 98.2 F (36.8 C), temperature source Oral, resp. rate 20, height 6\' 1"  (1.854 m), weight 127.914 kg (282 lb).Body mass index is 37.21 kg/(m^2).  General Appearance: Disheveled   Eye Solicitor::  Fair  Speech:  Garbled and Slow  Volume:  Decreased  Mood:  Dysphoric  Affect:  Blunt  Thought Process:  Disorganized  Orientation:  Full (Time, Place, and Person)  Thought Content:  Hallucinations: Auditory  Suicidal Thoughts:  No  Homicidal Thoughts:  No  Memory:  Immediate;   Poor Recent;   Poor Remote;   Poor  Judgement:  Impaired  Insight:  Lacking  Psychomotor Activity:  Increased and Restlessness  Concentration:  Poor  Recall:  Poor  Akathisia:  No  Handed:  Right  AIMS (if indicated):     Assets:  Communication Skills Desire for Improvement Physical Health Resilience Social Support  Sleep:  Number of Hours: 6.75   Current Medications: Current Facility-Administered Medications  Medication Dose Route Frequency Provider Last Rate Last Dose  . acetaminophen (TYLENOL) tablet 650 mg  650 mg Oral Q6H PRN [name], PA-C   650 mg at 04/22/13 1047  . alum & mag hydroxide-simeth (MAALOX/MYLANTA) 200-200-20 MG/5ML suspension 30 mL  30 mL Oral Q4H PRN [name], PA-C      . benztropine (COGENTIN) tablet 1 mg  1 mg Oral BID PRN [name], NP   1 mg at 04/22/13 2138   Or  . benztropine mesylate (COGENTIN) injection 1 mg  1 mg Intramuscular BID PRN [name], NP      . benztropine (COGENTIN) tablet 1 mg  1 mg Oral BID [name], NP   1 mg at 04/24/13 0802  . divalproex (DEPAKOTE) DR tablet 500 mg  500 mg Oral BID PC Jennilee Demarco   500 mg at  04/24/13 0802  . haloperidol (HALDOL) tablet 10 mg  10 mg Oral BID [name], NP   10 mg at 04/24/13 0802  . lisinopril (PRINIVIL,ZESTRIL) tablet 10 mg  10 mg Oral Daily [name], PA-C   10 mg at 04/24/13 5784  . LORazepam (ATIVAN) tablet 1 mg  1 mg Oral Q6H PRN Thresa Ross, MD   1 mg at 04/24/13 0702  . magnesium hydroxide (MILK OF MAGNESIA) suspension 30 mL  30 mL Oral Daily PRN [name], PA-C      . OLANZapine zydis (ZYPREXA) disintegrating tablet 10 mg  10 mg Oral Q8H PRN Bridey Brookover   10 mg at 04/24/13 6962    Lab Results: No results found for this or any previous visit (from the past 48 hour(s)).  Physical Findings: AIMS: Facial and Oral Movements Muscles of Facial Expression: None, normal Lips and Perioral Area: None, normal Jaw: None, normal Tongue: None, normal,Extremity Movements Upper (arms, wrists, hands, fingers): None, normal Lower (legs, knees, ankles, toes): None, normal, Trunk Movements Neck, shoulders, hips: None, normal, Overall Severity Severity of abnormal movements (highest score from questions above): None, normal Incapacitation due to abnormal movements: None, normal Patient's awareness of abnormal movements (rate only patient's report): No Awareness, Dental Status Current problems with teeth and/or dentures?: No Does patient usually wear dentures?: No  CIWA:  CIWA-Ar Total: 0 COWS:  COWS Total Score: 1  Treatment Plan Summary: Daily contact with patient to assess and evaluate symptoms and progress in treatment Medication management  Plan: Continue crisis management and stabilization.  Medication management:  Continue Haldol to 10 mg BID for continued psychosis. Change Cogentin to 1 mg BID for EPS prevention.  Continue Depakote 500mg  po BID for mood lability and agitation. Initiate Haldol decanoate 100mg  IM q4wkly, first dose today for psychosis/delusions. Encouraged patient to attend groups and participate in group counseling sessions and activities.  Discharge plan in progress. Sister to visit tomorrow to help determine if the patient is at his baseline.  Continue current treatment plan.  Address health issues: Vitals reviewed and stable. Patient has cast to right arm with intact sensation and capillary refill.   Medical Decision Making Problem Points:  Established problem, worsening (2) and Review of psycho-social stressors (1) Data Points:  Review of medication regiment & side effects (2)  I certify that inpatient services  furnished can reasonably be expected to improve the patient's condition.   Porche Steinberger,MD 04/24/2013, 11:20 AM

## 2013-04-24 NOTE — Progress Notes (Signed)
Recreation Therapy Notes  Date: 10.15.2014 Time: 9:30am Location: [address] Dayroom   Group Topic: Memory/Cognition  Goal Area(s) Addresses:  Patient will verbalize understanding of importance of working on memory/cognition. Patient will successfully repeat sequence developed by group.   Behavioral Response: Sleeping  Intervention: Game  Activity: Memory Sequence. Patient with LRT and group members developed a sequence of actions. Patients were asked to complete the sequence created by group and when prompted add onto that sequence.   Education:  Cognition  Education Outcome: Needs additional education  Clinical Observations/Feedback: LRT initially intended on patients playing group rock paper scissors. As LRT, with assistance of MHT staff explained rules of group activity it was apparent patients were unable to process instructions provided. Due to patients inability to interact in planned activity LRT changed group activity to memory sequence game.   Patient attended group, but did not engage in activity. Patient appeared to sleep throughout entire session, when prompted to participate in activity patient would make eye contact with LRT, but failed to engage in activity and maintained posture of sitting up with eyes closed.   [name], LRT/CTRS  Jearl Klinefelter 04/24/2013 4:27 PM

## 2013-04-24 NOTE — Progress Notes (Signed)
Adult Psychoeducational Group Note  Date:  04/24/2013 Time:  9:23 PM  Group Topic/Focus:  Wrap-Up Group:   The focus of this group is to help patients review their daily goal of treatment and discuss progress on daily workbooks.  Participation Level:  Minimal  Participation Quality:  Appropriate  Affect:  Blunted  Cognitive:  Lacking  Insight: Limited  Engagement in Group:  Engaged  Modes of Intervention:  Support  Additional Comments:  Patient attended and participated in group tonight. He reports having a visitor today. He also  attended his groups. The patient advised that he will be discharged home tomorrow.  For his personal development his plans to stay on his medication.   Lita Mains [facility] 04/24/2013, 9:23 PM

## 2013-04-24 NOTE — Progress Notes (Signed)
D: Patient in the dayroom sitting with his family on first approach.  Patient spoke with Clinical research associate when family left.  Patient calm, cooperative during conversation with Clinical research associate.  Patient states he has been attending his groups and learning.  Patient states she has learned he needs to stay out of trouble and to make sure he stays on his medications.  Patient denies SI/HI but admits to AVH but does not elaborate.  Patient states he is supposed to discharge tomorrow. A: Staff to monitor Q 15 mins for safety.  Encouragement and support offered.  No scheduled medications administered per orders. R: Patient remains safe on the unit.  Patient attended group tonight.  Patient visible on the unit.  Patient has no scheduled medications.

## 2013-04-24 NOTE — Progress Notes (Signed)
Patient in bed all evening and slept through the night. He woke up this morning; came to the day room and had his V/S done. After that patient started cursing out real loud. Appeared to be responding to internal stimuli. Writer offered patient Zyprexa 10 mg. He received it and continues  pacing the hallway and cursing very loudly. I offered patient an iced cup of Gatorade. He drank some, continues pacing; cursing very loudly and demanding to go to cafeteria. At 0702 am writer offered patient Ativan. Patient received the Ativan and walked to his room. Will continue to monitor.

## 2013-04-24 NOTE — Progress Notes (Signed)
D: Pt presents anxious this morning. Pt continues to respond to internal stimuli. Pt yelling, cursing and threatening to self. Pt appears to be arguing with himself several times throughout the day. Pt is unaware of his AVH. He continues to deny AVH. Denies SI/HI. A: Medications administered as ordered per MD. Long acting Haldol given this morning. Verbal support given. Pt encouraged to attend groups. 15 minute checks performed for safety. R: Pt has poor insight. Labile. Pt easily agitated. No complaints verbalized by pt. Pt safety maintained.

## 2013-04-24 NOTE — [facility] Group Notes (Signed)
[facility] [employer] Group Therapy  04/24/2013 , 1:27 PM    Type of Therapy:  [employer] Presentation  Participation Level:  Did not attend    Summary of Progress/Problems:  Onalee Hua from [employer] came to present his recovery story and play the guitar.    Daryel Gerald B 04/24/2013 , 1:27 PM

## 2013-04-24 NOTE — [facility] Group Notes (Signed)
[facility] LCSW Aftercare Discharge Planning Group Note   04/24/2013 9:37 AM  Participation Quality:  Did not attend.      Daryel Gerald B

## 2013-04-25 MED ORDER — POTASSIUM CHLORIDE CRYS ER 20 MEQ PO TBCR
20.0000 meq | EXTENDED_RELEASE_TABLET | Freq: Once | ORAL | Status: AC
  Administered 2013-04-25: 20 meq via ORAL
  Filled 2013-04-25: qty 1

## 2013-04-25 NOTE — Tx Team (Signed)
  Interdisciplinary Treatment Plan Update   Date Reviewed:  04/25/2013  Time Reviewed:  5:36 PM  Progress in Treatment:   Attending groups: Yes Participating in groups: Yes Taking medication as prescribed: Yes  Tolerating medication: Yes Family/Significant other contact made: Yes  Patient understands diagnosis: Yes  Discussing patient identified problems/goals with staff: Yes Medical problems stabilized or resolved: Yes Denies suicidal/homicidal ideation: Yes Patient has not harmed self or others: Yes  For review of initial/current patient goals, please see plan of care.  Estimated Length of Stay:  D/C tomorrow  Reason for Continuation of Hospitalization:   New Problems/Goals identified:  N/A  Discharge Plan or Barriers:   return home, follow up outpt  Additional Comments:  Attendees:  Signature: Thedore Mins, MD 04/25/2013 5:36 PM   Signature: Richelle Ito, LCSW 04/25/2013 5:36 PM  Signature: [name], NP 04/25/2013 5:36 PM  Signature: Joslyn Devon, RN 04/25/2013 5:36 PM  Signature: Liborio Nixon, RN 04/25/2013 5:36 PM  Signature:  04/25/2013 5:36 PM  Signature:   04/25/2013 5:36 PM  Signature:    Signature:    Signature:    Signature:    Signature:    Signature:      Scribe for Treatment Team:   Richelle Ito, LCSW  04/25/2013 5:36 PM

## 2013-04-25 NOTE — Progress Notes (Signed)
Patient ID: [name] [name], male   DOB: [birth year], 42 y.o.   MRN: [id number]    [facility] MD Progress Note  04/25/2013 10:55 AM [name] [name]  MRN:  [id number] Subjective:" I spoke to my sister, she said I can come back home."   Objective: Patient reports decreased mood swings and agitations. He denies delusions, suicidal thoughts and psychosis. His thought process is more organized but he continues to talk to himself.  Patient is compliant with his medications and has not endorsed any adverse reactions.   Diagnosis:   DSM5:  Axis I: Chronic Paranoid Schizophrenia Axis II: Deferred Axis III:  Past Medical History  Diagnosis Date  . Hypertension   . Pneumonia     had aspiration pneumonia 5/14-on vent   Axis IV: other psychosocial or environmental problems and problems with access to health care services Axis V: 40-50  ADL's:  intact  Sleep: fair  Appetite:  Fair  Suicidal Ideation:  Denies Homicidal Ideation:  Denies AEB (as evidenced by):  Psychiatric Specialty Exam: Review of Systems  Constitutional: Negative.   HENT: Negative.   Eyes: Negative.   Respiratory: Negative.   Cardiovascular: Negative.   Gastrointestinal: Negative.   Genitourinary: Negative.   Musculoskeletal: Negative.   Skin: Negative.   Neurological: Negative.   Endo/Heme/Allergies: Negative.   Psychiatric/Behavioral: Positive for hallucinations. Negative for suicidal ideas and substance abuse.    Blood pressure 168/83, pulse 86, temperature 98.1 F (36.7 C), temperature source Oral, resp. rate 18, height 6\' 1"  (1.854 m), weight 127.914 kg (282 lb).Body mass index is 37.21 kg/(m^2).  General Appearance: fairly groomed  Patent attorney::  Fair  Speech:  Garbled and Slow  Volume:  Decreased  Mood:  Dysphoric  Affect:  Blunt  Thought Process:  Disorganized  Orientation:  Full (Time, Place, and Person)  Thought Content:  Hallucinations: Auditory  Suicidal Thoughts:  No  Homicidal Thoughts:  No  Memory:   Immediate;   fair Recent;  fair Remote;   fair  Judgement:  Impaired  Insight:  Lacking  Psychomotor Activity:  Increased   Concentration: fair  Recall:  Poor  Akathisia:  No  Handed:  Right  AIMS (if indicated):     Assets:  Communication Skills Desire for Improvement Physical Health Resilience Social Support  Sleep:  Number of Hours: 6.75   Current Medications: Current Facility-Administered Medications  Medication Dose Route Frequency Provider Last Rate Last Dose  . acetaminophen (TYLENOL) tablet 650 mg  650 mg Oral Q6H PRN [name], PA-C   650 mg at 04/22/13 1047  . alum & mag hydroxide-simeth (MAALOX/MYLANTA) 200-200-20 MG/5ML suspension 30 mL  30 mL Oral Q4H PRN [name], PA-C      . benztropine (COGENTIN) tablet 1 mg  1 mg Oral BID PRN [name], NP   1 mg at 04/22/13 2138   Or  . benztropine mesylate (COGENTIN) injection 1 mg  1 mg Intramuscular BID PRN [name], NP      . benztropine (COGENTIN) tablet 1 mg  1 mg Oral BID [name], NP   1 mg at 04/25/13 0825  . divalproex (DEPAKOTE) DR tablet 500 mg  500 mg Oral BID PC Cyenna Rebello   500 mg at 04/25/13 0825  . haloperidol (HALDOL) tablet 10 mg  10 mg Oral BID [name], NP   10 mg at 04/25/13 0825  . lisinopril (PRINIVIL,ZESTRIL) tablet 10 mg  10 mg Oral Daily [name], PA-C   10 mg at  04/25/13 0825  . LORazepam (ATIVAN) tablet 1 mg  1 mg Oral Q6H PRN Thresa Ross, MD   1 mg at 04/24/13 1704  . magnesium hydroxide (MILK OF MAGNESIA) suspension 30 mL  30 mL Oral Daily PRN [name], PA-C      . OLANZapine zydis (ZYPREXA) disintegrating tablet 10 mg  10 mg Oral Q8H PRN Stefano Trulson   10 mg at 04/24/13 1429  . potassium chloride SA (K-DUR,KLOR-CON) CR tablet 20 mEq  20 mEq Oral Once Maksymilian Mabey        Lab Results: No results found for this or any previous visit (from the past 48 hour(s)).  Physical Findings: AIMS: Facial and Oral Movements Muscles of Facial Expression: None,  normal Lips and Perioral Area: None, normal Jaw: None, normal Tongue: None, normal,Extremity Movements Upper (arms, wrists, hands, fingers): None, normal Lower (legs, knees, ankles, toes): None, normal, Trunk Movements Neck, shoulders, hips: None, normal, Overall Severity Severity of abnormal movements (highest score from questions above): None, normal Incapacitation due to abnormal movements: None, normal Patient's awareness of abnormal movements (rate only patient's report): No Awareness, Dental Status Current problems with teeth and/or dentures?: No Does patient usually wear dentures?: No  CIWA:  CIWA-Ar Total: 0 COWS:  COWS Total Score: 1  Treatment Plan Summary: Daily contact with patient to assess and evaluate symptoms and progress in treatment Medication management  Plan: Continue crisis management and stabilization.  Medication management:  Continue Haldol to 10 mg BID for continued psychosis. Change Cogentin to 1 mg BID for EPS prevention.  Continue Depakote 500mg  po BID for mood lability and agitation.  Haldol decanoate 100mg  IM q4wkly, first dose for psychosis/delusions-received yesterday. Encouraged patient to attend groups and participate in group counseling sessions and activities.  Discharge plan in progress. Sister to visit tomorrow to help determine if the patient is at his baseline.  Continue current treatment plan.  Address health issues: Vitals reviewed and stable. Patient has cast to right arm with intact sensation and capillary refill.  Valproic acid level 04/26/13. Basic metabolic panel to monitor potassium level on 04/26/13.  Medical Decision Making Problem Points:  Established problem, improving (1) and Review of psycho-social stressors (1) Data Points:  Review of medication regiment & side effects (2)  I certify that inpatient services furnished can reasonably be expected to improve the patient's condition.   Drayke Grabel,MD 04/25/2013, 10:55 AM

## 2013-04-25 NOTE — Progress Notes (Signed)
Patient ID: [name] [name], male   DOB: February 12, 1971, 42 y.o.   MRN: [id number]  D: Pt denies SI/HI/AVH. Pt is pleasant and cooperative. Pt still appears to be responding to AVH even though he denies. Pt is polite and compliant with medication and seems very willing and concerned about taking his medications. Pt says he is ready to go home tomorrow.  A: Pt was offered support and encouragement. Pt was given scheduled medications. Pt was encourage to attend groups. Q 15 minute checks were done for safety.   R:Pt attends groups and interacts well with peers and staff. Pt is taking medication. Pt has no complaints at this time.Pt receptive to treatment and safety maintained on unit.

## 2013-04-25 NOTE — Progress Notes (Signed)
[facility] Adult Case Management Discharge Plan :  Will you be returning to the same living situation after discharge: Yes,  home At discharge, do you have transportation home?:Yes,  family Do you have the ability to pay for your medications:Yes,  MCD  Release of information consent forms completed and in the chart;  Patient's signature needed at discharge.  Patient to Follow up at: Follow-up Information   Follow up with [facility]  On 05/08/2013. (At 2:15pm.  Go see [name] for your follow-up doctor's appointment.  )    Contact information:   [phone] [address], [address] [place] [phone]      Patient denies SI/HI:   Yes,  yes    Aeronautical engineer and Suicide Prevention discussed:  Yes,  yes  Ida Rogue 04/25/2013, 5:40 PM

## 2013-04-25 NOTE — [facility] Group Notes (Signed)
[facility] Group Notes:  (Nursing/MHT/Case Management/Adjunct)  Date:  04/25/2013 Time:  12:49 PM  Type of Therapy:  Group Therapy   Participation Level:  Minimal  Participation Quality:  Inattentive  Affect:  Labile  Cognitive:  Lacking  Insight:  Limited  Engagement in Group:  Limited  Modes of Intervention: Discussion, Exploration and Socialization   Summary of Progress/Problems:  The topic for group was balance in life. Pt participated in the discussion about when their life was in balance and out of balance and how this feels. Pt discussed ways to get back in balance and short term goals they can work on to get where they want to be.  Kenneth stated that he feels balanced because he took a nap prior to group.  Kept on rambling to himself.  When asked if he was okay, Jenson decided to leave group.    Simona Huh MSW Intern  04/25/2013 12:49 PM

## 2013-04-25 NOTE — [facility] Suicide Risk Assessment (Signed)
[facility] INPATIENT:  Family/Significant Other Suicide Prevention Education  Suicide Prevention Education:  Education Completed; No one has been identified by the patient as the family member/significant other with whom the patient will be residing, and identified as the person(s) who will aid the patient in the event of a mental health crisis (suicidal ideations/suicide attempt).  With written consent from the patient, the family member/significant other has been provided the following suicide prevention education, prior to the and/or following the discharge of the patient.  The suicide prevention education provided includes the following:  Suicide risk factors  Suicide prevention and interventions  National Suicide Hotline telephone number  [facility] assessment telephone number  [facility] Emergency Assistance 911  [facility] and/or Residential Mobile Crisis Unit telephone number  Request made of family/significant other to:  Remove weapons (e.g., guns, rifles, knives), all items previously/currently identified as safety concern.    Remove drugs/medications (over-the-counter, prescriptions, illicit drugs), all items previously/currently identified as a safety concern.  The family member/significant other verbalizes understanding of the suicide prevention education information provided.  The family member/significant other agrees to remove the items of safety concern listed above. The patient did not endorse SI at the time of admission, nor did the patient c/o SI during the stay here.  SPE not required.   Daryel Gerald B 04/25/2013, 5:41 PM

## 2013-04-25 NOTE — Progress Notes (Signed)
D:  Patient up and present in the milieu most of the shift.  At times he goes into his room alone and starts ranting.  He does not do this outside of his room.  He has been in the dayroom interacting with peers and attending groups.  Requested Ativan this afternoon for anxiety and agitation per his request.  States he is going home tomorrow.   A:  Medications given as prescribed.  Educated about medications.  Encouraged participation in groups.  Explained to patient that discharge would likely happen tomorrow afternoon.    R:  Verbalized understanding of current medications and of discharge plans.  Interacting well with staff and peers.  Tolerating current medications.  Safety is maintained.

## 2013-04-26 LAB — BASIC METABOLIC PANEL
BUN: 11 mg/dL (ref 6–23)
CO2: 26 mEq/L (ref 19–32)
Calcium: 9.2 mg/dL (ref 8.4–10.5)
Creatinine, Ser: 0.72 mg/dL (ref 0.50–1.35)
GFR calc non Af Amer: >90 mL/min (ref >90)
Glucose, Bld: 107 mg/dL — ABNORMAL HIGH (ref 70–99)
Potassium: 4 mEq/L (ref 3.5–5.1)
Sodium: 134 mEq/L — ABNORMAL LOW (ref 135–145)

## 2013-04-26 MED ORDER — OLANZAPINE 10 MG PO TBDP
10.0000 mg | ORAL_TABLET | Freq: Two times a day (BID) | ORAL | Status: DC | PRN
  Administered 2013-04-26: 10 mg via ORAL
  Filled 2013-04-26: qty 1
  Filled 2013-04-26: qty 6

## 2013-04-26 MED ORDER — OLANZAPINE 10 MG PO TBDP: 10.0000 mg | ORAL_TABLET | Freq: Two times a day (BID) | ORAL | Status: DC | PRN

## 2013-04-26 MED ORDER — BENZTROPINE MESYLATE 1 MG PO TABS: 1.0000 mg | ORAL_TABLET | Freq: Two times a day (BID) | ORAL | Status: DC

## 2013-04-26 MED ORDER — HALOPERIDOL DECANOATE 100 MG/ML IM SOLN: 100.0000 mg | INTRAMUSCULAR | Status: DC

## 2013-04-26 MED ORDER — HALOPERIDOL DECANOATE 100 MG/ML IM SOLN
100.0000 mg | INTRAMUSCULAR | Status: DC
  Filled 2013-04-26: qty 1

## 2013-04-26 MED ORDER — LISINOPRIL 10 MG PO TABS: 10.0000 mg | ORAL_TABLET | Freq: Every day | ORAL | Status: DC

## 2013-04-26 MED ORDER — HALOPERIDOL 10 MG PO TABS: 10.0000 mg | ORAL_TABLET | Freq: Two times a day (BID) | ORAL | Status: DC

## 2013-04-26 MED ORDER — DIVALPROEX SODIUM 500 MG PO DR TAB: 500.0000 mg | DELAYED_RELEASE_TABLET | Freq: Two times a day (BID) | ORAL | Status: DC

## 2013-04-26 NOTE — Progress Notes (Signed)
Recreation Therapy Notes  Date: 10.17.2014 Time: 9:30am Location: [address] Dayroom  Group Topic: Leisure Education  Goal Area(s) Addresses:  Patient will identify appropriate leisure activity to correspond with letters of the alphabet.  Patient will identify benefit of leisure.   Behavioral Response: Appropriate, Halluncinating  Intervention: Game  Activity: Leisure ABC's. As a group patients were asked to identify leisure activities to correspond with each letter of the alphabet.   Education:  Leisure Education, Building control surveyor  Education Outcome: Needs additional education  Clinical Observations/Feedback: Patient participated in group session, but needed prompts to engage. When prompted patient engaged appropriately in activity, stating appropriate activities. Patient was observed to laugh at times, without being engaged in conversation with anyone, additionally patient looked through LRT and waved, as though he was waving at someone. LRT was standing with back to wall at this time and he did not respond with change in facial expression when LRT smiled at him.   Marykay Lex Cutberto Winfree, LRT/CTRS  Jearl Klinefelter 04/26/2013 12:49 PM

## 2013-04-26 NOTE — Progress Notes (Signed)
Patient ID: [name] [name], male   DOB: 12-Mar-1971, 42 y.o.   MRN: [id number] D. Patient presents with depressed mood, blunted affect. Patient states '' I'm supposed to be going home today . Rod told me that he talked with my sister and I can go home.  I'm ready to go '' Patient educated on discharge process and verbalized understanding. Pt denies any acute concerns at this time. Per notes patient with poor sleep at HS, and intermittent agitated behaviors. A. Discussed above information with treatment team and MD. Medications given as ordered. Support, encouragement provided. R. Patient remains cooperative at this time. Orders received for patient discharge. Discharge instructions reviewed with patient, copy of AVS provided to patients care giver, RX provided, 7 day free supply of medication, and crisis services reviewed. Patient escorted from unit with staff and family member.

## 2013-04-26 NOTE — [facility] Suicide Risk Assessment (Signed)
Suicide Risk Assessment  Discharge Assessment     Demographic Factors:  Male, Low socioeconomic status, Living alone and Unemployed  Mental Status Per Nursing Assessment::   On Admission:     Current Mental Status by Physician: patient denies suicide ideation, intent or plan  Loss Factors: Decrease in vocational status, Decline in physical health and Financial problems/change in socioeconomic status  Historical Factors: Family history of mental illness or substance abuse and Impulsivity  Risk Reduction Factors:   Living with another person, especially a relative, Positive social support and Positive therapeutic relationship  Continued Clinical Symptoms:  Resolving delusion and psychosis  Cognitive Features That Contribute To Risk:  Closed-mindedness Polarized thinking    Suicide Risk:  Minimal: No identifiable suicidal ideation.  Patients presenting with no risk factors but with morbid ruminations; may be classified as minimal risk based on the severity of the depressive symptoms  Discharge Diagnoses:   AXIS I:  Chronic Paranoid Schizophrenia AXIS II:  Deferred AXIS III:   Past Medical History  Diagnosis Date  . Hypertension   . Pneumonia    AXIS IV:  economic problems, other psychosocial or environmental problems and problems related to social environment AXIS V:  61-70 mild symptoms  Plan Of Care/Follow-up recommendations:  Activity:  as tolerated Diet:  healthy Tests:  Valproic acid level: 53.3 Other:  patient to keep his after care appointment.  Is patient on multiple antipsychotic therapies at discharge:  No   Has Patient had three or more failed trials of antipsychotic monotherapy by history:  No  Recommended Plan for Multiple Antipsychotic Therapies: N/A  Thedore Mins, MD 04/26/2013, 9:37 AM

## 2013-04-26 NOTE — Discharge Summary (Signed)
Physician Discharge Summary Note  Patient:  [name] [name] is an 42 y.o., male MRN:  [id number] DOB:  05-26-1971 Patient phone:  [phone] (home)  Patient address:   [address] [place] [address]   Date of Admission:  04/20/2013 Date of Discharge: 04/26/2013  Discharge Diagnoses: Active Problems:   Paranoid schizophrenia  Axis Diagnosis:  AXIS I: Chronic Paranoid Schizophrenia  AXIS II: Deferred  AXIS III:  Past Medical History   Diagnosis  Date   .  Hypertension    .  Pneumonia    AXIS IV: economic problems, other psychosocial or environmental problems and problems related to social environment  AXIS V: 61-70 mild symptoms   Level of Care:  OP  Hospital Course:   [name] [name] is a 42 year old male who was Involuntarily committed after presenting to the [facility] with [facility] after being notified by patient's stepsister that he had been acting bizarre. Patient had not been taking his haldol injections since last October after being released from prison for breaking into a motor vehicle. Patient has been hearing voices that curse at him which causes him to have anger outbursts. Today upon assessment the patient is a poor historian stating only to why was he admitted to the hospital "I am mentally ill and need treatment. I want to go home. I'm fine now." The patient does not appear to have any insight into his mental illness. His family per the ED notes were very concerned about the patient's prior to admission behavior. Patient was banging on the walls and frightening his family.   While a patient in this hospital, [name] was enrolled in group counseling and activities as well as received the following medication No current facility-administered medications for this encounter. Current outpatient prescriptions:benztropine (COGENTIN) 1 MG tablet, Take 1 tablet (1 mg total) by mouth 2 (two) times daily., Disp: 60 tablet, Rfl: 0;  clonazePAM (KLONOPIN) 2 MG tablet, Take 2 mg by  mouth 2 (two) times daily as needed for anxiety., Disp: , Rfl: ;  divalproex (DEPAKOTE) 500 MG DR tablet, Take 1 tablet (500 mg total) by mouth 2 (two) times daily after a meal., Disp: 60 tablet, Rfl: 0 haloperidol (HALDOL) 10 MG tablet, Take 1 tablet (10 mg total) by mouth 2 (two) times daily., Disp: 60 tablet, Rfl: 0;  haloperidol decanoate (HALDOL DECANOATE) 100 MG/ML injection, Inject 1 mL (100 mg total) into the muscle every 30 (thirty) days. Next dose due on 05/23/13., Disp: 1 mL, Rfl: 2;  ibuprofen (ADVIL,MOTRIN) 200 MG tablet, Take 200 mg by mouth every 6 (six) hours as needed for pain (pain)., Disp: , Rfl:  lisinopril (PRINIVIL,ZESTRIL) 10 MG tablet, Take 1 tablet (10 mg total) by mouth daily., Disp: , Rfl: ;  OLANZapine zydis (ZYPREXA) 10 MG disintegrating tablet, Take 1 tablet (10 mg total) by mouth every 12 (twelve) hours as needed (agitation/anger outburst)., Disp: 30 tablet, Rfl: 0 Patient's antipsychotic medication was changed from Seroquel XR to Haldol 10 mg BID. The patient continued to talk to himself and demonstrate aggressive behaviors such as punching the wall in his room. [name] was also started on Haldol Decanoate 100 mg IM during his stay. The patient was compliant with his medications. He was also ordered Zyprexa Zydis prn agitation or angry outbursts. The patient continued to have no insight into his mental illness or the reasons for his hospital stay. His sister came to pick him up at discharge. Patient attended treatment team meeting this am and  met with treatment team members. Pt symptoms, treatment plan and response to treatment discussed. [name] endorsed that their symptoms have improved. Pt also stated that they are stable for discharge.  In other to control Active Problems:   Paranoid schizophrenia , they will continue psychiatric care on outpatient basis. They will follow-up at  Follow-up Information   Follow up with [facility]  On 05/08/2013. (At 2:15pm.  Go  see [name] for your follow-up doctor's appointment.  )    Contact information:   [phone] [address], [address] [place] [phone]    .  In addition they were instructed to take all your medications as prescribed by your mental healthcare provider, to report any adverse effects and or reactions from your medicines to your outpatient provider promptly, patient is instructed and cautioned to not engage in alcohol and or illegal drug use while on prescription medicines, in the event of worsening symptoms, patient is instructed to call the crisis hotline, 911 and or go to the nearest ED for appropriate evaluation and treatment of symptoms.   Upon discharge, patient adamantly denies suicidal, homicidal ideations, auditory, visual hallucinations and or delusional thinking. They [facility] with all personal belongings in no apparent distress.  Consults:  See electronic record for details  Significant Diagnostic Studies:  See electronic record for details  Discharge Vitals:   Blood pressure 138/93, pulse 92, temperature 98.4 F (36.9 C), temperature source Oral, resp. rate 16, height 6\' 1"  (1.854 m), weight 127.914 kg (282 lb)..  Mental Status Exam: See Mental Status Examination and Suicide Risk Assessment completed by Attending Physician prior to discharge.  Discharge destination:  Home  Is patient on multiple antipsychotic therapies at discharge:  No  Has Patient had three or more failed trials of antipsychotic monotherapy by history: N/A Recommended Plan for Multiple Antipsychotic Therapies: N/A     Discharge Orders   Future Orders Complete By Expires   Discharge instructions  As directed    Comments:     Patient received his last dose of Haldol Decanoate on 04/24/13 and it is due every thirty days. Next dose will be due on 05/23/13.       Medication List    STOP taking these medications       oxyCODONE-acetaminophen 5-325 MG per tablet  Commonly known as:  PERCOCET      QUEtiapine 300 MG 24 hr tablet  Commonly known as:  SEROQUEL XR      TAKE these medications     Indication   benztropine 1 MG tablet  Commonly known as:  COGENTIN  Take 1 tablet (1 mg total) by mouth 2 (two) times daily.   Indication:  Extrapyramidal Reaction caused by Medications     clonazePAM 2 MG tablet  Commonly known as:  KLONOPIN  Take 2 mg by mouth 2 (two) times daily as needed for anxiety.      divalproex 500 MG DR tablet  Commonly known as:  DEPAKOTE  Take 1 tablet (500 mg total) by mouth 2 (two) times daily after a meal.   Indication:  mood lability     haloperidol 10 MG tablet  Commonly known as:  HALDOL  Take 1 tablet (10 mg total) by mouth 2 (two) times daily.   Indication:  Schizophrenia     haloperidol decanoate 100 MG/ML injection  Commonly known as:  HALDOL DECANOATE  Inject 1 mL (100 mg total) into the muscle every 30 (thirty) days. Next dose due on 05/23/13.  Indication:  Schizophrenia     ibuprofen 200 MG tablet  Commonly known as:  ADVIL,MOTRIN  Take 200 mg by mouth every 6 (six) hours as needed for pain (pain).      lisinopril 10 MG tablet  Commonly known as:  PRINIVIL,ZESTRIL  Take 1 tablet (10 mg total) by mouth daily.   Indication:  High Blood Pressure     OLANZapine zydis 10 MG disintegrating tablet  Commonly known as:  ZYPREXA  Take 1 tablet (10 mg total) by mouth every 12 (twelve) hours as needed (agitation/anger outburst).   Indication:  Schizophrenia       Follow-up Information   Follow up with [facility]  On 05/08/2013. (At 2:15pm.  Go see [name] for your follow-up doctor's appointment.  )    Contact information:   [phone] [address], [address] [place] [phone]     Follow-up recommendations:   Activities: Resume typical activities Diet: Resume typical diet Tests: none Other: Follow up with outpatient provider and report any side effects to out patient prescriber.  Comments:  Take all your  medications as prescribed by your mental healthcare provider. Report any adverse effects and or reactions from your medicines to your outpatient provider promptly. Patient is instructed and cautioned to not engage in alcohol and or illegal drug use while on prescription medicines. In the event of worsening symptoms, patient is instructed to call the crisis hotline, 911 and or go to the nearest ED for appropriate evaluation and treatment of symptoms. Follow-up with your primary care provider for your other medical issues, concerns and or health care needs.  SignedFransisca Kaufmann NP-C 04/28/2013 11:33 AM

## 2013-04-29 NOTE — Discharge Summary (Signed)
Seen and agreed. Rudean Icenhour, MD 

## 2013-05-01 NOTE — Progress Notes (Signed)
Patient Discharge Instructions:  After Visit Summary (AVS):   Faxed to:  05/01/13 Discharge Summary Note:   Faxed to:  05/01/13 Psychiatric Admission Assessment Note:   Faxed to:  05/01/13 Suicide Risk Assessment - Discharge Assessment:   Faxed to:  05/01/13 Faxed/Sent to the Next Level Care provider:  05/01/13 Faxed to [facility] @ [phone]  Jerelene Redden, 05/01/2013, 3:04 PM

## 2013-05-23 ENCOUNTER — Emergency Department (HOSPITAL_COMMUNITY)
Admission: EM | Admit: 2013-05-23 | Discharge: 2013-05-29 | Disposition: A | Discharge status: Home / Self Care | Payer: PRIVATE HEALTH INSURANCE | Attending: Emergency Medicine | Admitting: Emergency Medicine

## 2013-05-23 ENCOUNTER — Encounter (HOSPITAL_COMMUNITY): Payer: Self-pay | Admitting: Emergency Medicine

## 2013-05-23 DIAGNOSIS — I10 Essential (primary) hypertension: Secondary | ICD-10-CM | POA: Insufficient documentation

## 2013-05-23 DIAGNOSIS — Z8701 Personal history of pneumonia (recurrent): Secondary | ICD-10-CM | POA: Insufficient documentation

## 2013-05-23 DIAGNOSIS — Z79899 Other long term (current) drug therapy: Secondary | ICD-10-CM | POA: Insufficient documentation

## 2013-05-23 DIAGNOSIS — F17200 Nicotine dependence, unspecified, uncomplicated: Secondary | ICD-10-CM | POA: Insufficient documentation

## 2013-05-23 DIAGNOSIS — F209 Schizophrenia, unspecified: Secondary | ICD-10-CM | POA: Insufficient documentation

## 2013-05-23 DIAGNOSIS — F200 Paranoid schizophrenia: Secondary | ICD-10-CM

## 2013-05-23 LAB — CBC
MCH: 28.3 pg (ref 26.0–34.0)
MCHC: 33.1 g/dL (ref 30.0–36.0)
Platelets: 129 10*3/uL — ABNORMAL LOW (ref 150–400)
RBC: 5.84 MIL/uL — ABNORMAL HIGH (ref 4.22–5.81)
RDW: 14.2 % (ref 11.5–15.5)

## 2013-05-23 LAB — COMPREHENSIVE METABOLIC PANEL
ALT: 15 U/L (ref 0–53)
AST: 18 U/L (ref 0–37)
CO2: 29 mEq/L (ref 19–32)
Calcium: 9.8 mg/dL (ref 8.4–10.5)
Sodium: 137 mEq/L (ref 135–145)
Total Protein: 7.7 g/dL (ref 6.0–8.3)

## 2013-05-23 LAB — RAPID URINE DRUG SCREEN, HOSP PERFORMED
Amphetamines: NOT DETECTED
Cocaine: NOT DETECTED
Opiates: NOT DETECTED
Tetrahydrocannabinol: NOT DETECTED

## 2013-05-23 LAB — ACETAMINOPHEN LEVEL: Acetaminophen (Tylenol), Serum: <15 ug/mL (ref 10–30)

## 2013-05-23 LAB — ETHANOL: Alcohol, Ethyl (B): <11 mg/dL (ref 0–11)

## 2013-05-23 MED ORDER — LOXAPINE SUCCINATE 25 MG PO CAPS
25.0000 mg | ORAL_CAPSULE | Freq: Three times a day (TID) | ORAL | Status: DC
  Administered 2013-05-24 – 2013-05-29 (×17): 25 mg via ORAL
  Filled 2013-05-23 (×20): qty 1

## 2013-05-23 MED ORDER — BENZTROPINE MESYLATE 1 MG PO TABS
1.0000 mg | ORAL_TABLET | Freq: Two times a day (BID) | ORAL | Status: DC
  Administered 2013-05-23 – 2013-05-29 (×13): 1 mg via ORAL
  Filled 2013-05-23 (×13): qty 1

## 2013-05-23 MED ORDER — HALOPERIDOL DECANOATE 100 MG/ML IM SOLN
100.0000 mg | INTRAMUSCULAR | Status: DC
  Filled 2013-05-23: qty 1

## 2013-05-23 MED ORDER — OLANZAPINE 10 MG PO TBDP
10.0000 mg | ORAL_TABLET | Freq: Two times a day (BID) | ORAL | Status: DC | PRN
  Administered 2013-05-24 (×2): 10 mg via ORAL
  Filled 2013-05-23 (×2): qty 1

## 2013-05-23 MED ORDER — QUETIAPINE FUMARATE ER 300 MG PO TB24
300.0000 mg | ORAL_TABLET | Freq: Two times a day (BID) | ORAL | Status: DC
  Administered 2013-05-23 – 2013-05-29 (×13): 300 mg via ORAL
  Filled 2013-05-23 (×15): qty 1

## 2013-05-23 MED ORDER — ACETAMINOPHEN 325 MG PO TABS: 650.0000 mg | ORAL_TABLET | ORAL | Status: DC | PRN

## 2013-05-23 MED ORDER — ONDANSETRON HCL 4 MG PO TABS: 4.0000 mg | ORAL_TABLET | Freq: Three times a day (TID) | ORAL | Status: DC | PRN

## 2013-05-23 MED ORDER — LISINOPRIL 10 MG PO TABS
10.0000 mg | ORAL_TABLET | Freq: Every day | ORAL | Status: DC
  Administered 2013-05-23 – 2013-05-29 (×7): 10 mg via ORAL
  Filled 2013-05-23 (×8): qty 1

## 2013-05-23 MED ORDER — ALUM & MAG HYDROXIDE-SIMETH 200-200-20 MG/5ML PO SUSP: 30.0000 mL | ORAL | Status: DC | PRN

## 2013-05-23 MED ORDER — DICLOFENAC SODIUM 75 MG PO TBEC
75.0000 mg | DELAYED_RELEASE_TABLET | Freq: Two times a day (BID) | ORAL | Status: DC
  Administered 2013-05-23 – 2013-05-29 (×13): 75 mg via ORAL
  Filled 2013-05-23 (×15): qty 1

## 2013-05-23 MED ORDER — DIVALPROEX SODIUM 500 MG PO DR TAB
500.0000 mg | DELAYED_RELEASE_TABLET | Freq: Two times a day (BID) | ORAL | Status: DC
  Administered 2013-05-24 – 2013-05-27 (×8): 500 mg via ORAL
  Filled 2013-05-23 (×8): qty 1

## 2013-05-23 MED ORDER — CLONAZEPAM 1 MG PO TABS
2.0000 mg | ORAL_TABLET | Freq: Two times a day (BID) | ORAL | Status: DC | PRN
  Administered 2013-05-24 – 2013-05-26 (×5): 2 mg via ORAL
  Filled 2013-05-23 (×6): qty 2

## 2013-05-23 MED ORDER — IBUPROFEN 200 MG PO TABS: 600.0000 mg | ORAL_TABLET | Freq: Three times a day (TID) | ORAL | Status: DC | PRN

## 2013-05-23 MED ORDER — HALOPERIDOL 5 MG PO TABS
10.0000 mg | ORAL_TABLET | Freq: Two times a day (BID) | ORAL | Status: DC
  Administered 2013-05-23 – 2013-05-29 (×13): 10 mg via ORAL
  Filled 2013-05-23 (×16): qty 2

## 2013-05-23 NOTE — Progress Notes (Addendum)
Pt referred to [facility], [facility], and [facility].  Review pending.  Marva Panda, LCSWA  [phone]  .05/23/2013  9:00 pm  Per Rosey Bath at [facility].  Pt denied due to acuity.    Marva Panda, LCSWA  [phone] 05/23/2013 9:35 pm

## 2013-05-23 NOTE — BH Assessment (Signed)
[facility] Assessment Progress Note   At 16:00 I spoke to EDP Dr Walden in anticipation of impending TTS assessment.  Delisa Finck, MA Triage Specialist 05/23/2013 @ 16:14     

## 2013-05-23 NOTE — ED Notes (Signed)
Pt sister stated that Haldol Decoanate was given IM today at [facility]. Sister also states pt receives 500mg  Depakote in the am and 1000 HS. Pt has not had any valproic levels drawn since he has been at sisters house. Pt also takes haldol also  20 mg am and 20 mg pm daily. Also klonopin 1mg  qid

## 2013-05-23 NOTE — ED Provider Notes (Signed)
CSN: [id number]     Arrival date & time 05/23/13  1238 History  This chart was scribed for non-physician practitioner, Marlon Pel, PA-C working with Shon Baton, MD by Greggory Stallion, ED scribe. This patient was seen in room [facility]TR4/[facility]PT4 and the patient's care was started at 1:12 PM.   Chief Complaint  Patient presents with  . Medical Clearance   The history is provided by the patient. No language interpreter was used.   HPI Comments:  Patient not IVC'd currently but will need to be by his legal guardians if he tries to leave.  Difficult interview because patient is sedated from his medications. [name] [name] is a 42 y.o. male who presents to the Emergency Department for medical clearance. He states he has been having loud outbursts for the last two weeks. He has PMH of schizophrenia and does not know why he has been more aggressive than normal. He denies drug abuse. Pt denies SI/HI. He states he has taken his medications everyday.  Past Medical History  Diagnosis Date  . Schizophrenia   . Hypertension   . Pneumonia     had aspiration pneumonia 5/14-on vent   Past Surgical History  Procedure Laterality Date  . Arm surgery      hurt his arm 25 hr ago  . Open reduction internal fixation (orif) proximal phalanx Right 04/02/2013    Procedure: OPEN REDUCTION INTERNAL FIXATION (ORIF) RIGHT SMALL FINGER;  Surgeon: Tami Ribas, MD;  Location: [facility];  Service: Orthopedics;  Laterality: Right;   History reviewed. No pertinent family history. History  Substance Use Topics  . Smoking status: Current Every Day Smoker -- 0.25 packs/day    Types: Cigarettes  . Smokeless tobacco: Not on file  . Alcohol Use: No     Comment: not now    Review of Systems  Psychiatric/Behavioral: Negative for suicidal ideas.  All other systems reviewed and are negative.    Allergies  Review of patient's allergies indicates no known allergies.  Home Medications   Current  Outpatient Rx  Name  Route  Sig  Dispense  Refill  . benztropine (COGENTIN) 1 MG tablet   Oral   Take 1 tablet (1 mg total) by mouth 2 (two) times daily.   60 tablet   0   . clonazePAM (KLONOPIN) 2 MG tablet   Oral   Take 2 mg by mouth 2 (two) times daily as needed for anxiety.         . diclofenac (VOLTAREN) 75 MG EC tablet   Oral   Take 1 tablet by mouth 2 (two) times daily.         . divalproex (DEPAKOTE) 500 MG DR tablet   Oral   Take 1 tablet (500 mg total) by mouth 2 (two) times daily after a meal.   60 tablet   0   . haloperidol (HALDOL) 10 MG tablet   Oral   Take 1 tablet (10 mg total) by mouth 2 (two) times daily.   60 tablet   0   . haloperidol decanoate (HALDOL DECANOATE) 100 MG/ML injection   Intramuscular   Inject 1 mL (100 mg total) into the muscle every 30 (thirty) days. Next dose due on 05/23/13.   1 mL   2   . HYDROcodone-acetaminophen (NORCO/VICODIN) 5-325 MG per tablet   Oral   Take 2 tablets by mouth every 6 (six) hours as needed.         [name] Kitchen ibuprofen (ADVIL,MOTRIN)  200 MG tablet   Oral   Take 200 mg by mouth every 6 (six) hours as needed for pain (pain).         [name] Kitchen lisinopril (PRINIVIL,ZESTRIL) 10 MG tablet   Oral   Take 1 tablet (10 mg total) by mouth daily.         [name] Kitchen loxapine (LOXITANE) 25 MG capsule   Oral   Take 1 capsule by mouth 3 (three) times daily.         [name] Kitchen OLANZapine zydis (ZYPREXA) 10 MG disintegrating tablet   Oral   Take 1 tablet (10 mg total) by mouth every 12 (twelve) hours as needed (agitation/anger outburst).   30 tablet   0   . oxyCODONE-acetaminophen (PERCOCET/ROXICET) 5-325 MG per tablet   Oral   Take 1 tablet by mouth daily.         . SEROQUEL XR 300 MG 24 hr tablet   Oral   Take 1 tablet by mouth 2 (two) times daily.          BP 162/95  Pulse 73  Temp(Src) 97.7 F (36.5 C) (Oral)  Resp 19  SpO2 100%  Physical Exam  Nursing note and vitals reviewed. Constitutional: He is oriented to  person, place, and time. He appears well-developed and well-nourished. No distress.  HENT:  Head: Normocephalic and atraumatic.  Eyes: EOM are normal.  Neck: Neck supple. No tracheal deviation present.  Cardiovascular: Normal rate.   Pulmonary/Chest: Effort normal. No respiratory distress.  Musculoskeletal: Normal range of motion.  Neurological: He is alert and oriented to person, place, and time.  Skin: Skin is warm and dry.  Psychiatric:  Very drowsy. Difficult to understand.    ED Course  Procedures (including critical care time)  DIAGNOSTIC STUDIES: Oxygen Saturation is 100% on RA, normal by my interpretation.    COORDINATION OF CARE: 1:13 PM-Discussed treatment plan which includes blood work with pt at bedside and pt agreed to plan.   Labs Review Labs Reviewed  ACETAMINOPHEN LEVEL  CBC  COMPREHENSIVE METABOLIC PANEL  ETHANOL  SALICYLATE LEVEL  URINE RAPID DRUG SCREEN (HOSP PERFORMED)   Imaging Review No results found.  EKG Interpretation   None       MDM   1. Schizophrenia    Holding orders placed Med clearance lab orders placed. Home med rec completed Pts guardians number has been added to the chart.  I personally performed the services described in this documentation, which was scribed in my presence. The recorded information has been reviewed and is accurate.   Dorthula Matas, PA-C 05/23/13 1319

## 2013-05-23 NOTE — ED Notes (Signed)
Pt sister Kylo Gavin came and brought guardianship paperwork over pt. TTS went out to speak with patient's sister.

## 2013-05-23 NOTE — ED Notes (Signed)
Pt moved to other side of unit. Pt was moved for better placement so he does not disrupt the milieu.

## 2013-05-23 NOTE — ED Notes (Signed)
Pt resting. Eyes closed. Resp even and unlabored.  Psych assessment deferred until later time.

## 2013-05-23 NOTE — ED Notes (Signed)
Pt in bathroom yelling and beating on wall when staff entered lights were off staff redirected pt back to room. Pt was yelling in hall stating Im just going to bed man!

## 2013-05-23 NOTE — ED Notes (Addendum)
Please call pt's sister (legal guardian) with updates: [phone]. Sister sts if pt decide to leave we need to call her so that he can be IVC'd. PA Tiffany aware. Sister is also asking not to allow pt to use phone, because last time when he admitted she sts he was making  harassing calls to family members.

## 2013-05-23 NOTE — ED Notes (Signed)
Pt in bathroom responding to internal stimuli. Pt yelling and smacking wall. When staff approached. Pt calmed down and was polite redirected easily.
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
## 2013-05-23 NOTE — ED Notes (Signed)
Writer is allowing patient to rest, no VS at this time

## 2013-05-23 NOTE — ED Notes (Addendum)
Pt states he does not hear or see any hallucinations. He is not SI or HI. He is just pissed off at his family. He is sick of them. He is sick of the cops messing with him also. Pt mumbling some of his words.

## 2013-05-23 NOTE — ED Notes (Addendum)
Grey sneakers with red nike check. Grey and white shirt, grey and white jogging pants, and grey sweat shirt. ( blue lighter). Locker 36

## 2013-05-23 NOTE — ED Notes (Addendum)
Pt presents to ed with c/o med.clearance. Per pt's sister who is his legal guardian pt has been having violent outbursts for about 2 weeks now. Pt denies SI/HI, however sister sts pt is verbally abusive toward her and her children and "is really mean lately". Sister also sts she is thinking about placing pt in group home because it is getting hard to care for him.

## 2013-05-23 NOTE — ED Provider Notes (Signed)
Medical screening examination/treatment/procedure(s) were performed by non-physician practitioner and as supervising physician I was immediately available for consultation/collaboration.  EKG Interpretation   None        [name] [name] F Cay[place] Kielbasa, MD 05/23/13 1627 

## 2013-05-24 DIAGNOSIS — F200 Paranoid schizophrenia: Secondary | ICD-10-CM

## 2013-05-24 MED ORDER — OLANZAPINE 10 MG PO TBDP
10.0000 mg | ORAL_TABLET | Freq: Once | ORAL | Status: DC
  Filled 2013-05-24: qty 1

## 2013-05-24 MED ORDER — OLANZAPINE 10 MG PO TBDP
10.0000 mg | ORAL_TABLET | Freq: Three times a day (TID) | ORAL | Status: DC | PRN
  Administered 2013-05-24 – 2013-05-28 (×7): 10 mg via ORAL
  Filled 2013-05-24 (×6): qty 1

## 2013-05-24 NOTE — ED Notes (Signed)
Calling [facility] at [facility] to see what we do about the $6 pt alleges he had on him when he came to hospital. Although, a belongings sheet was not completed when he came back to the psych ed and his stuff was in the wrong locker when we went through belongings we seen the same stuff documented previously in triage. Pt upset he said he don't take nobody's stuff and he don't like when people take his stuff. AC at [facility] said to do safety zone. Also, security has nothing being held in their area for the patient.

## 2013-05-24 NOTE — ED Notes (Signed)
Patient is pacing back and fourth in hallway and talking with himself rn Archie Patten is with patient now.patient and rn is being observed on camera for safety

## 2013-05-24 NOTE — ED Notes (Signed)
Patient pacing hallways

## 2013-05-24 NOTE — ED Notes (Signed)
Per EDP Walden zyprexa can be given again now.

## 2013-05-24 NOTE — ED Notes (Signed)
Pt awakened to use the rest room.  Once in the rest room he began screaming and cursing, responding to internal stimuli.  Pt then exited the bathroom on his own and went back into his room.  Once in his room, pt began screaming and cursing again stating, "I don't give a fuck" and mumbling other words to himself.

## 2013-05-24 NOTE — Progress Notes (Addendum)
Pt referred to [facility] and [facility], at this time [facility] does not have any available beds but still reviewing patient. CSW left message for [facility] to follow up on referral.    Pt declined from [facility] [place].    Catha Gosselin, LCSW [phone]  ED CSW .05/24/2013 958am   Addendum:  Pt referred to [facility], pt still pending review per [facility] however beds available.  Pt referred to [facility], pending review, however no beds available.  Pt referred to [facility] pending review.  Pt referred to [facility] pending review.

## 2013-05-24 NOTE — ED Notes (Signed)
pateint came up to desk requesting for [place] to look in his belongings and make sure it was recorded how much he had,so when mht vanita looked in his chart there wasn't a patient belonging sheet so writer went and looked in his chart it said his clothes were in 36 i went and got his clothes looked through everything in tcu and i did not see nay money that was in any of his belongings at all will let rn Tourist information centre manager aware if the situation i did a patient belonging sheet today but i did not  Ask patient to sign it at this time waiting for rn to address the situation
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
## 2013-05-24 NOTE — Consult Note (Signed)
Pt was seen with NP. Agree with above assessment and plan. 

## 2013-05-25 ENCOUNTER — Emergency Department (HOSPITAL_COMMUNITY): Payer: PRIVATE HEALTH INSURANCE

## 2013-05-25 ENCOUNTER — Encounter (HOSPITAL_COMMUNITY): Payer: Self-pay | Admitting: Radiology

## 2013-05-25 DIAGNOSIS — F200 Paranoid schizophrenia: Secondary | ICD-10-CM

## 2013-05-25 LAB — VALPROIC ACID LEVEL: Valproic Acid Lvl: 68.5 ug/mL (ref 50.0–100.0)

## 2013-05-25 MED ORDER — HALOPERIDOL LACTATE 5 MG/ML IJ SOLN
5.0000 mg | Freq: Once | INTRAMUSCULAR | Status: AC
  Administered 2013-05-25: 5 mg via INTRAMUSCULAR
  Filled 2013-05-25: qty 1

## 2013-05-25 MED ORDER — HALOPERIDOL LACTATE 5 MG/ML IJ SOLN: 10.0000 mg | Freq: Once | INTRAMUSCULAR | Status: DC

## 2013-05-25 MED ORDER — LORAZEPAM 2 MG/ML IJ SOLN
2.0000 mg | Freq: Once | INTRAMUSCULAR | Status: AC
Start: 2013-05-25 — End: 2013-05-25
  Administered 2013-05-25: 2 mg via INTRAMUSCULAR
  Filled 2013-05-25: qty 1

## 2013-05-25 MED ORDER — DIPHENHYDRAMINE HCL 50 MG/ML IJ SOLN
50.0000 mg | Freq: Once | INTRAMUSCULAR | Status: AC
  Administered 2013-05-25: 50 mg via INTRAMUSCULAR
  Filled 2013-05-25: qty 1

## 2013-05-25 NOTE — ED Notes (Signed)
Pt currently sleeping and snoring. Due to his acuity writer will not wake him up to assess at this time.

## 2013-05-25 NOTE — ED Notes (Signed)
Pt medicated per MD orders; security and off duty officer accompanied Clinical research associate but pt allowed Clinical research associate to give the injections. Pt talking non sensical and mumbling.

## 2013-05-25 NOTE — BH Assessment (Signed)
Patient declined at [facility] due to acuity, per Danny.  

## 2013-05-25 NOTE — ED Notes (Signed)
Pt went in BR with lights off and was talking to his reflection. Pt came out in hall in front of his room and was yelling and cursing and giving the finger to whomever he saw in the hallway which nobody was there at all.

## 2013-05-25 NOTE — ED Notes (Signed)
Pt took his medications without incident and went right back to sleep.

## 2013-05-25 NOTE — ED Notes (Signed)
I attempt to draw his blood and was unsuccessful.  Made the nurse aware.

## 2013-05-25 NOTE — Consult Note (Signed)
Approached by nursing due to pt behaviors disruptive to unit-outbursts of rage-yelling/cursing;urinating on his image in the BR mirror unresponsive to Meds.Ordered additional 5mg  IM haldol+ativan and benadryl .Some effect but not controlled.Pt has been stable and was taking his medications as ordered.He could not account for his sudden change in personality.Brain tumors can sometimes present in this manner.Request CT scan to r/o organic etiology given his lack of response to heavy doses of medication and lack of substance abuse recently.

## 2013-05-25 NOTE — ED Notes (Signed)
In bathroom yelling, cursing at mirror. Asking [employer] for his medicine. Cooperative, polite with staff.

## 2013-05-25 NOTE — ED Notes (Signed)
JC, pharmacist said that MD that ordered haldol IM 10 mg now needs to know pt had haldol deconate 05/23/13.

## 2013-05-25 NOTE — ED Notes (Addendum)
Asleep during time for vital signs. 

## 2013-05-25 NOTE — ED Notes (Signed)
Mental health tech escorted to radiology and will stay during the procedure.

## 2013-05-25 NOTE — ED Notes (Signed)
SZP completed; called AC at [facility] and gave her the event #[id number].

## 2013-05-25 NOTE — Progress Notes (Signed)
Patient ID: [name], male   DOB: 05-11-1971, 42 y.o.   MRN: [id number] Patient Identification:  [name] Date of Evaluation:  05/25/2013   History of Present Illness: Brought in by sister for violent outburst of anger and abusive.  Patient states "  I need help, my head is messed up, I need injection only.  We will admit patient to our 400 hall unit when bed is available or continue to look for placement else where.  He is calm and cooperative and no anger outburst so far.  He denies SI/HI.  Past Psychiatric History: Paranoid Schizophrenia   Past Medical History:     Past Medical History  Diagnosis Date  . Schizophrenia   . Hypertension   . Pneumonia     had aspiration pneumonia 5/14-on vent       Past Surgical History  Procedure Laterality Date  . Arm surgery      hurt his arm 25 hr ago  . Open reduction internal fixation (orif) proximal phalanx Right 04/02/2013    Procedure: OPEN REDUCTION INTERNAL FIXATION (ORIF) RIGHT SMALL FINGER;  Surgeon: [name] Ribas, MD;  Location: [facility];  Service: Orthopedics;  Laterality: Right;    Allergies: No Known Allergies  Current Medications:  Prior to Admission medications   Medication Sig Start Date End Date Taking? Authorizing Provider  benztropine (COGENTIN) 1 MG tablet Take 1 tablet (1 mg total) by mouth 2 (two) times daily. 04/26/13  Yes [name] Kaufmann, NP  clonazePAM (KLONOPIN) 2 MG tablet Take 2 mg by mouth 2 (two) times daily as needed for anxiety.   Yes Historical Provider, MD  diclofenac (VOLTAREN) 75 MG EC tablet Take 1 tablet by mouth 2 (two) times daily. 05/07/13  Yes Historical Provider, MD  divalproex (DEPAKOTE) 500 MG [name] tablet Take 1 tablet (500 mg total) by mouth 2 (two) times daily after a meal. 04/26/13  Yes [name] Kaufmann, NP  haloperidol (HALDOL) 10 MG tablet Take 1 tablet (10 mg total) by mouth 2 (two) times daily. 04/26/13  Yes [name] Kaufmann, NP  haloperidol decanoate (HALDOL DECANOATE) 100  MG/ML injection Inject 1 mL (100 mg total) into the muscle every 30 (thirty) days. Next dose due on 05/23/13. 04/26/13  Yes [name] Kaufmann, NP  HYDROcodone-acetaminophen (NORCO/VICODIN) 5-325 MG per tablet Take 2 tablets by mouth every 6 (six) hours as needed. 05/15/13  Yes Historical Provider, MD  ibuprofen (ADVIL,MOTRIN) 200 MG tablet Take 200 mg by mouth every 6 (six) hours as needed for pain (pain).   Yes Historical Provider, MD  lisinopril (PRINIVIL,ZESTRIL) 10 MG tablet Take 1 tablet (10 mg total) by mouth daily. 04/26/13  Yes [name] Kaufmann, NP  loxapine (LOXITANE) 25 MG capsule Take 1 capsule by mouth 3 (three) times daily. 05/09/13  Yes Historical Provider, MD  OLANZapine zydis (ZYPREXA) 10 MG disintegrating tablet Take 1 tablet (10 mg total) by mouth every 12 (twelve) hours as needed (agitation/anger outburst). 04/26/13  Yes [name] Kaufmann, NP  oxyCODONE-acetaminophen (PERCOCET/ROXICET) 5-325 MG per tablet Take 1 tablet by mouth daily. 05/07/13  Yes Historical Provider, MD  SEROQUEL XR 300 MG 24 hr tablet Take 1 tablet by mouth 2 (two) times daily. 04/09/13  Yes Historical Provider, MD    Social History:    reports that he has been smoking Cigarettes.  He has been smoking about 0.25 packs per day. He does not have any smokeless tobacco history on file. He reports that he does not drink alcohol or use illicit drugs.  Family History:    History reviewed. No pertinent family history.  Mental Status Examination/Evaluation:Psychiatric Specialty Exam: Physical Exam  ROS  Blood pressure 154/94, pulse 86, temperature 97.7 F (36.5 C), temperature source Oral, resp. rate 18, SpO2 96.00%.There is no weight on file to calculate BMI.  General Appearance: Casual and Disheveled  Eye Contact::  Good  Speech:  Garbled and Normal Rate  Volume:  Decreased  Mood:  Anxious and Depressed  Affect:  Blunt, Constricted and Flat  Thought Process:  Linear and Loose  Orientation:  Full (Time, Place, and Person)   Thought Content:  Hallucinations: Auditory Visual and Paranoid Ideation  Suicidal Thoughts:  No  Homicidal Thoughts:  No  Memory:  Immediate;   Good Recent;   Fair Remote;   Fair  Judgement:  Impaired  Insight:  Shallow  Psychomotor Activity:  Normal  Concentration:  Good  Recall:  NA  Akathisia:  NA  Handed:  Right  AIMS (if indicated):     Assets:  Desire for Improvement  Sleep:          DIAGNOSIS:   AXIS I   Chronic Paranoid Scizophrenia*  AXIS II  Deffered  AXIS III See medical notes.  AXIS IV other psychosocial or environmental problems and problems related to social environment  AXIS V 21-30 behavior considerably influenced by delusions or hallucinations OR serious impairment in judgment, communication OR inability to function in almost all areas     Assessment/Plan:  Seen on rounds with [name] We will continue with our plan of care-placement at any facility with available beds or admit to our 400 hall when bed becomes available Continue to administer his medications Continue to provide safety.  [name]   PMHNP-BC ADDENDUM NOTE:  CT of head obtained this am without contrast is Negative for mass or tumor. [name] [name]   PMHNP-BC  Reviewed the information documented and agree with the treatment plan.  [name],[name] 05/25/2013 5:07 PM

## 2013-05-25 NOTE — ED Notes (Addendum)
Asleep during time for vital signs.

## 2013-05-26 ENCOUNTER — Encounter (HOSPITAL_COMMUNITY): Payer: Self-pay | Admitting: Registered Nurse

## 2013-05-26 MED ORDER — ZIPRASIDONE MESYLATE 20 MG IM SOLR
10.0000 mg | Freq: Once | INTRAMUSCULAR | Status: AC
  Administered 2013-05-26: 10 mg via INTRAMUSCULAR
  Filled 2013-05-26: qty 20

## 2013-05-26 MED ORDER — TRAZODONE HCL 100 MG PO TABS
100.0000 mg | ORAL_TABLET | Freq: Once | ORAL | Status: AC
  Administered 2013-05-27: 100 mg via ORAL
  Filled 2013-05-26: qty 1

## 2013-05-26 MED ORDER — DIPHENHYDRAMINE HCL 50 MG/ML IJ SOLN
50.0000 mg | Freq: Once | INTRAMUSCULAR | Status: AC
Start: 2013-05-26 — End: 2013-05-26
  Administered 2013-05-26: 50 mg via INTRAMUSCULAR
  Filled 2013-05-26: qty 1

## 2013-05-26 MED ORDER — LORAZEPAM 2 MG/ML IJ SOLN
2.0000 mg | Freq: Once | INTRAMUSCULAR | Status: AC
  Administered 2013-05-26: 2 mg via INTRAMUSCULAR
  Filled 2013-05-26: qty 1

## 2013-05-26 NOTE — Consult Note (Signed)
  Subjective:  Patient states that he is feeling better today and that there has been a decrease in the voices.  "I'm ready to go home.  I ain't hearing the voice no more.  They better than yesterday; yesterday I was looking at my self in the mirror talking to my self; the voices was making me say things out loud.  I want to leave tomorrow; I'm going to my sisters house she lives [address] in [place].  She going to take me to see my doctor at [facility]. Dr. Betti Cruz."  Patient appears to be responding to internal stimuli. Patient is still talking out loud to him self. Patient is easily agitated.  Psychiatric Specialty Exam: Physical Exam  ROS  Blood pressure 157/95, pulse 72, temperature 97.6 F (36.4 C), temperature source Oral, resp. rate 18, SpO2 99.00%.There is no weight on file to calculate BMI.  General Appearance: Casual and Disheveled  Eye Contact::  Good  Speech:  Clear and Coherent and Normal Rate  Volume:  Normal  Mood:  Anxious and Depressed  Affect:  Blunt, Constricted and Flat  Thought Process:  Circumstantial  Orientation:  Full (Time, Place, and Person)  Thought Content:  Hallucinations: Auditory and Rumination  Suicidal Thoughts:  No  Homicidal Thoughts:  No  Memory:  Immediate;   Good Recent;   Fair  Judgement:  Impaired  Insight:  Shallow  Psychomotor Activity:  Normal  Concentration:  Fair  Recall:  Fair  Akathisia:  No  Handed:  Right  AIMS (if indicated):     Assets:  Communication Skills Desire for Improvement  Sleep:      Face to face interview/consult with Dr. Elsie Saas  Disposition:  Continue with current treatment plan for inpatient treatment.  Continue to monitor patient for safety and stabilization until inpatient bed is located  Shuvon B. Rankin FNP-BC Family Nurse Practitioner, Board Certified   Reviewed the information documented and agree with the treatment plan.  Adelard Sanon,[name] 05/27/2013 8:47 AM

## 2013-05-26 NOTE — ED Notes (Signed)
I have seen a big improvement over the weekend with Zyprexa being given.

## 2013-05-27 MED ORDER — CLONAZEPAM 1 MG PO TABS
1.0000 mg | ORAL_TABLET | Freq: Three times a day (TID) | ORAL | Status: DC
  Administered 2013-05-27 – 2013-05-29 (×7): 1 mg via ORAL
  Filled 2013-05-27 (×7): qty 1

## 2013-05-27 MED ORDER — DIVALPROEX SODIUM 500 MG PO DR TAB
500.0000 mg | DELAYED_RELEASE_TABLET | Freq: Every day | ORAL | Status: DC
  Administered 2013-05-28 – 2013-05-29 (×2): 500 mg via ORAL
  Filled 2013-05-27 (×2): qty 1

## 2013-05-27 MED ORDER — DIVALPROEX SODIUM 500 MG PO DR TAB
1000.0000 mg | DELAYED_RELEASE_TABLET | Freq: Every day | ORAL | Status: DC
  Administered 2013-05-27 – 2013-05-28 (×2): 1000 mg via ORAL
  Filled 2013-05-27 (×2): qty 2

## 2013-05-27 NOTE — BH Assessment (Addendum)
Pt's sister and guardian Froilan Chelsye Suhre [phone] called for update re: pt. Pt then asked to speak w/ sister.  They are talking on phone to each other now. Ms Wiswell asked to be notified if pt gets a bed at [facility] or another facility.  Evette Cristal, [place] Assessment Counselor

## 2013-05-27 NOTE — Progress Notes (Signed)
Patient ID: [name] [name], male   DOB: 1971-01-21, 42 y.o.   MRN: [id number] Psychiatric Specialty Exam: Physical Exam  ROS  Blood pressure 157/98, pulse 92, temperature 97.7 F (36.5 C), temperature source Oral, resp. rate 20, SpO2 100.00%.There is no weight on file to calculate BMI.  General Appearance: Disheveled  Eye Contact::  Good  Speech:  Clear and Coherent and Normal Rate  Volume:  Normal  Mood:  Anxious and Irritable  Affect:  Congruent, Flat and Labile  Thought Process:  Coherent  Orientation:  Full (Time, Place, and Person)  Thought Content:  Hallucinations: Auditory Visual  Suicidal Thoughts:  No  Homicidal Thoughts:  No  Memory:  Immediate;   Fair Recent;   Fair Remote;   Fair  Judgement:  Impaired  Insight:  Fair and Shallow  Psychomotor Activity:  Normal  Concentration:  Fair  Recall:  NA  Akathisia:  NA  Handed:  Right  AIMS (if indicated):     Assets:  Desire for Improvement  Sleep:      Seen today with Dr Tawni Carnes.  We obtained Depakote level this am which shows a sub therapeutic level of 44.  We made changes to his Depakote to 500 mg po in am and 1000 MG PO QHS.  We will reevalute and obtain another Depakote level on Thursday.  Patient is asking for discharge home but  We do not feel he is ready for discharge.  Several times today patient was seen talking to the TV, he is looking disheveled and not washing as he supposed to be doing.  He is not aggressive or showing any signs of agitation.  We will will continue to monitor patient.  Dahlia Byes  PMHNP-BC

## 2013-05-28 DIAGNOSIS — F209 Schizophrenia, unspecified: Secondary | ICD-10-CM

## 2013-05-28 NOTE — Progress Notes (Signed)
Writer spoke to the patients sister to determine the patients base line level.  The sister reports that the patient is not at base line.   Patient has received a medication adjustments and he will be reassessed in the am for possible discharge home to his sister.

## 2013-05-28 NOTE — Consult Note (Signed)
  Psychiatric Specialty Exam: Physical Exam  ROS  Blood pressure 151/100, pulse 86, temperature 97.9 F (36.6 C), temperature source Oral, resp. rate 16, SpO2 99.00%.There is no weight on file to calculate BMI.  General Appearance: Casual  Eye Contact::  Good  Speech:  Clear and Coherent  Volume:  Normal  Mood:  Anxious  Affect:  Congruent  Thought Process:  coherent but is talking to the voices when we were just outside the room  Orientation:  Full (Time, Place, and Person)  Thought Content:  Hallucinations: Auditory  Suicidal Thoughts:  No  Homicidal Thoughts:  No  Memory:  Immediate;   Good Recent;   Good Remote;   Good  Judgement:  Fair  Insight:  Lacking  Psychomotor Activity:  Normal  Concentration:  Good  Recall:  Good  Akathisia:  Negative  Handed:  Right  AIMS (if indicated):     Assets:  Architect Housing Physical Health  Sleep:   good   [name] [name] wants to go home but in talking to his sister with whom he lives she is uncomfortable having him home because he hears voices, gets angry, threatens/scares her little children and she is afraid he will hurt them.  He has not hurt them in the past.  He seems baseline to [place] but she says he can be better and the last time he was in the hospital for a week he came out better. Consequently, he will stay overnight and see how he is doing in the morning.  She wants him inpatient, he wants to go home.

## 2013-05-29 ENCOUNTER — Encounter (HOSPITAL_COMMUNITY): Payer: Self-pay | Admitting: Registered Nurse

## 2013-05-29 DIAGNOSIS — F200 Paranoid schizophrenia: Secondary | ICD-10-CM

## 2013-05-29 NOTE — Progress Notes (Addendum)
Dr. Ladona Ridgel called to check to see if pt had arrived at home.  There is no record of pts guardian calling to give or request updated information concerning pt.  Per request of Dr. Ladona Ridgel, CSW called pts guardian to check on pts status.  CSW spoke with pts guardian/sister, [name] [name] [phone].   [name] [name] reports that pt has not returned home and that the police has told the family that they have looked in the places that the pt usually goes and have not been able to locate him.  [name] [name] reports that the police have told her that they are done searching for the pt.   [name] [name] states that she wants more information about how the pt was discharged on his own when she had guardianship paperwork in his file.  [name] [name] reports that she will come to the hospital 05/30/13 in the am to follow up on the details of patients discharge because not only does she not know where her brother is, but she also doesn't know if he was discharged with medications.  [name] [name], [name] [name]  [phone] .05/29/2013  5:20 pm

## 2013-05-29 NOTE — Progress Notes (Addendum)
Per discussion with psychiatrist and NP, patient is psychiatrically stable for discharge home. CSW called to speak with pt guardian Loranzo Desha. CSW called the house and another member of the household answered the phone. CSW asked to speak with Ms. France Noyce, CSW was informed that she is in the shower. At that time, Monte Zinni was speaking loudly in the background asking who was calling, and what the hosptial needed. CSW spoke with member of the household to really to guardian that the doctor's are stating discharge today. Member of the household got upset, and stated that patient could not come back after having an incident yesterday. CSW stated that this writer would need to speak to guardian directly. Member of the house hold hung up the phone, stating, "We'll talk to the other people."   .Frutoso Schatz [phone]  ED CSW .05/29/2013 939am    CSW spoke with Wendelyn Breslow at [phone], pt guardian. CSW and pt guardian discussed that patient is psychiatrically stable for discharge home. Ms. Eichinger and CSW discussed resources for potential group home placements. Pt guardian stated she would prefer to pick up patient and can provide transportation for patient home between 5 and 6pm. CSW will inform evening CSW so she can follow up with pt guardian and provide resources.   Catha Gosselin, LCSW [phone]  ED CSW .05/29/2013 1056am

## 2013-05-29 NOTE — Progress Notes (Signed)
Pt was discharged while CSW was speaking with guardian without this Clinical research associate being aware. Pt was given a bus pass and was discharged. Patient guardian had requested that patient be picked up by guardian self in the hospital. CSW contacted guardian to inform her status of patient. Pt guardian stated, "You better hope and pray that nothing happens to my brother today or I will sue you." CSW called non emergency to try to locate patient, and provided pt address.  CSW spoke with sherrif's department and officers dispatched officers to look around the hosptial and the address. CSW confirmed the address with pt guardian of [address] [place], [place].  Catha Gosselin, LCSW [phone]  ED CSW .05/29/2013 12:10pm

## 2013-05-29 NOTE — ED Provider Notes (Signed)
Pt cleared for discharge per psychiatry.  Celene Kras, MD 05/29/13 1011

## 2013-05-29 NOTE — Consult Note (Signed)
Subjective: Patient states that he is ready to go home.  States that he has spoken to his sister this morning. Patient has not had any outburst even when he did not get his way.  Discussed with patient being able to control his anger when he does not get his way.  Patient states that he has never hurt anyone and would never hurt children.  Patient continues to seem at his baseline (talking to the voices) non threaten and non command. Patient will be discharged home to follow up with his primary.   Psychiatric Specialty Exam: Physical Exam  Constitutional: He is oriented to person, place, and time.  Neck: Normal range of motion.  Respiratory: Effort normal.  Neurological: He is alert and oriented to person, place, and time.  Skin: Skin is warm and dry.  Psychiatric: His speech is normal. His mood appears anxious.    ROS  Blood pressure 128/82, pulse 80, temperature 97.5 F (36.4 C), temperature source Oral, resp. rate 18, SpO2 100.00%.There is no weight on file to calculate BMI.  General Appearance: Casual  Eye Contact::  Good  Speech:  Clear and Coherent  Volume:  Normal  Mood:  Anxious  Affect:  Congruent  Thought Process:  Continues to talk to voices.  Nonthreatening.  Base line for this patient  Orientation:  Full (Time, Place, and Person)  Thought Content:  Hallucinations: Auditory Base line  Suicidal Thoughts:  No  Homicidal Thoughts:  No  Memory:  Immediate;   Good Recent;   Good Remote;   Good  Judgement:  Fair  Insight:  Fair  Psychomotor Activity:  Normal  Concentration:  Good  Recall:  Good  Akathisia:  Negative  Handed:  Right  AIMS (if indicated):     Assets:  Architect Housing Physical Health  Sleep:   good    Face to face interview/consult with Dr. Ladona Ridgel  Disposition:  Discharge home. Patient to follow up with [facility].  If sister of patient doesn't want patient in home can give a list of group homes that  she can look into.  Also recommend including therapy into his outpatient treatment.  Shamiyah Ngu B. Aeon Kessner FNP-BC Family Nurse Practitioner, Board Certified

## 2013-05-30 NOTE — Progress Notes (Addendum)
CSW called patient guardian this morning at 828 am, to inquire if patient had made it home safely. Pt guardian asked who specifically discharged the patient, and gave the patient a bus pass. CSW was unable to name the person specifically and directed the guardian to medical records. CSW also offered for patient to speak with psychiatrist. Psychiatrist spoke with pt guardian on the phone and discssed with guardian that this psychiatrist, had cleared patient for discharge, however there was miscommunication with the EDP that the patient would be picked up by 5 by the guardian. Psychiatrist provided pt guardian his name, and she said thank you that's what she needed and hung up the phone. CSW called guardian back to inquire if patient had made it home safely, and she refused to answer the question. CSW sought supervison from Director who agreed that [employer] should complete a safety check on the patient at home to ensure he was home with the guardian. CSW contacted [employer] who stated an Technical sales engineer would be dispatched and call this writer back to confirm if patient was home safely.   Catha Gosselin, LCSW [phone]  ED CSW .05/30/2013 915am   Addendum: CSW received call from [employer], who confirmed patient is at home safely with patient guardian.   Catha Gosselin, LCSW [phone]  ED CSW .05/30/2013 9:24am

## 2013-05-31 NOTE — Progress Notes (Signed)
Pt was interviewed with NP. Agree with assessment and plan. 

## 2013-06-04 NOTE — Consult Note (Signed)
Note reviewed and agreed with  

## 2013-06-26 ENCOUNTER — Encounter (HOSPITAL_COMMUNITY): Payer: Self-pay | Admitting: Emergency Medicine

## 2013-06-26 ENCOUNTER — Emergency Department (HOSPITAL_COMMUNITY)
Admission: EM | Admit: 2013-06-26 | Discharge: 2013-06-26 | Disposition: A | Discharge status: Home / Self Care | Payer: PRIVATE HEALTH INSURANCE | Attending: Emergency Medicine | Admitting: Emergency Medicine

## 2013-06-26 DIAGNOSIS — Z8701 Personal history of pneumonia (recurrent): Secondary | ICD-10-CM | POA: Insufficient documentation

## 2013-06-26 DIAGNOSIS — Z79899 Other long term (current) drug therapy: Secondary | ICD-10-CM | POA: Insufficient documentation

## 2013-06-26 DIAGNOSIS — F209 Schizophrenia, unspecified: Secondary | ICD-10-CM | POA: Insufficient documentation

## 2013-06-26 DIAGNOSIS — F17200 Nicotine dependence, unspecified, uncomplicated: Secondary | ICD-10-CM | POA: Insufficient documentation

## 2013-06-26 DIAGNOSIS — I10 Essential (primary) hypertension: Secondary | ICD-10-CM | POA: Insufficient documentation

## 2013-06-26 DIAGNOSIS — R443 Hallucinations, unspecified: Secondary | ICD-10-CM | POA: Insufficient documentation

## 2013-06-26 DIAGNOSIS — Z791 Long term (current) use of non-steroidal anti-inflammatories (NSAID): Secondary | ICD-10-CM | POA: Insufficient documentation

## 2013-06-26 DIAGNOSIS — F29 Unspecified psychosis not due to a substance or known physiological condition: Secondary | ICD-10-CM

## 2013-06-26 DIAGNOSIS — R440 Auditory hallucinations: Secondary | ICD-10-CM

## 2013-06-26 NOTE — ED Notes (Signed)
Pt is having auditory hallucinations, denies SI/HI. Hx of schizophrenia. Pt lost support system was staying with sister but had a falling out, now has no where to stay.

## 2013-06-26 NOTE — ED Notes (Signed)
Pt. Was given a [place] sandwich along with a cup of coffee. Nurse was notified.

## 2013-06-26 NOTE — ED Provider Notes (Signed)
CSN: [id number]     Arrival date & time 06/26/13  1430 History  This chart was scribed for Marlon Pel, PA working with Laray Anger, DO by Quintella Reichert, ED Scribe. This patient was seen in room [facility]TR3/[facility]PT3 and the patient's care was started at 2:51 PM.   Chief Complaint  Patient presents with  . Medical Clearance    The history is provided by the patient (and a Child psychotherapist). No language interpreter was used.    HPI Comments: [name] [name] is a 42 y.o. male with h/o schizophrenia who presents to the Emergency Department complaining of loss of his social support group and requesting admission to [facility] and assistance finding somewhere to live.  Pt was living with his sister who was his guardian until today.  He reports he was kicked out because "we weren't getting along."  He states that she is no longer his guardian and he no longer wants to live with her.  However social worker reports that his sister is still his court-appointed legal guardian.  Pt reports that he wants to have his own place and he hopes he can be admitted to [facility] and receive help finding somewhere to live.  He states he does not have anywhere else to live currenlty.  Presently he denies any medical complaints.  He states he is hearing voices telling him "I can't keep still."  He denies SI/HI and denies the voices telling him to hurt himself or anyone else.   Past Medical History  Diagnosis Date  . Schizophrenia   . Hypertension   . Pneumonia     had aspiration pneumonia 5/14-on vent    Past Surgical History  Procedure Laterality Date  . Arm surgery      hurt his arm 25 hr ago  . Open reduction internal fixation (orif) proximal phalanx Right 04/02/2013    Procedure: OPEN REDUCTION INTERNAL FIXATION (ORIF) RIGHT SMALL FINGER;  Surgeon: Tami Ribas, MD;  Location: [facility];  Service: Orthopedics;  Laterality: Right;    No family history on file.   History   Substance Use Topics  . Smoking status: Current Every Day Smoker -- 0.25 packs/day    Types: Cigarettes  . Smokeless tobacco: Not on file  . Alcohol Use: No     Comment: not now     Review of Systems  Psychiatric/Behavioral: Positive for hallucinations. Negative for suicidal ideas.  All other systems reviewed and are negative.     Allergies  Review of patient's allergies indicates no known allergies.  Home Medications   Current Outpatient Rx  Name  Route  Sig  Dispense  Refill  . clonazePAM (KLONOPIN) 2 MG tablet   Oral   Take 2 mg by mouth 2 (two) times daily as needed for anxiety.         . diclofenac (VOLTAREN) 75 MG EC tablet   Oral   Take 1 tablet by mouth 2 (two) times daily.         . divalproex (DEPAKOTE) 500 MG DR tablet   Oral   Take 1 tablet (500 mg total) by mouth 2 (two) times daily after a meal.   60 tablet   0   . divalproex (DEPAKOTE) 500 MG DR tablet   Oral   Take 500-1,000 mg by mouth 2 (two) times daily. 1 tablet in the morning and 2 tablets at night         . haloperidol (HALDOL) 10 MG tablet  Oral   Take 1 tablet (10 mg total) by mouth 2 (two) times daily.   60 tablet   0   . haloperidol decanoate (HALDOL DECANOATE) 100 MG/ML injection   Intramuscular   Inject 1 mL (100 mg total) into the muscle every 30 (thirty) days. Next dose due on 05/23/13.   1 mL   2   . HYDROcodone-acetaminophen (NORCO/VICODIN) 5-325 MG per tablet   Oral   Take 2 tablets by mouth every 6 (six) hours as needed for moderate pain.          [name] Kitchen lisinopril (PRINIVIL,ZESTRIL) 10 MG tablet   Oral   Take 1 tablet (10 mg total) by mouth daily.         [name] Kitchen loxapine (LOXITANE) 25 MG capsule   Oral   Take 1 capsule by mouth 4 (four) times daily.          [name] Kitchen OLANZapine zydis (ZYPREXA) 10 MG disintegrating tablet   Oral   Take 1 tablet (10 mg total) by mouth every 12 (twelve) hours as needed (agitation/anger outburst).   30 tablet   0    BP 152/94  Pulse  90  Temp(Src) 98.3 F (36.8 C) (Oral)  Resp 16  SpO2 99%  Physical Exam  Nursing note and vitals reviewed. Constitutional: He appears well-developed and well-nourished. No distress.  HENT:  Head: Normocephalic and atraumatic.  Eyes: EOM are normal.  Neck: Neck supple. No tracheal deviation present.  Cardiovascular: Normal rate.   Pulmonary/Chest: Effort normal. No respiratory distress.  Musculoskeletal: Normal range of motion.  Neurological: He is alert.  Skin: Skin is warm and dry.  Psychiatric: He is actively hallucinating (auditory). He expresses no homicidal and no suicidal ideation. He expresses no suicidal plans and no homicidal plans.    ED Course  Procedures (including critical care time)  DIAGNOSTIC STUDIES: Oxygen Saturation is 99% on room air, normal by my interpretation.    COORDINATION OF CARE: 2:56 PM: Social work has seen and spoken with patient.  Currently he meets no criteria for psychiatric or social work holding.  Social work will contact sister who is the legal guardian to come get him from the ER.  Pt has no medical complaints or concerns at this time.  Dr. Ladona Ridgel and Denice Bors, NP saw patient prior to sister arriving and do not have concerns with patient being a harm to himself or others. Kristen with social work and myself have also seen patient and he does not appear to be a harm to himself or anyone else.  Dr. Criss Alvine, the psych doc for [facility] ED today is aware as well as the charge RN that patients guardian is coming to get patient.   Labs Review Labs Reviewed - No data to display  Imaging Review No results found.  EKG Interpretation   None       MDM   1. Psychotic disorder   2. Auditory hallucinations    Ava has seen patient and given outpatient referrals, Dr. Criss Alvine saw pt, he is ready to be discharged.  I personally performed the services described in this documentation, which was scribed in my presence. The recorded information has been  reviewed and is accurate.    Dorthula Matas, PA-C 06/26/13 1758

## 2013-06-26 NOTE — Consult Note (Signed)
[facility] Face-to-Face Psychiatry Consult   Reason for Consult:  Hearing voices Referring Physician:  EDP  [name] [name] is an 42 y.o. male.  Assessment: AXIS I:  Schizoaffective Disorder AXIS II:  Deferred AXIS III:   Past Medical History  Diagnosis Date  . Schizophrenia   . Hypertension   . Pneumonia     had aspiration pneumonia 5/14-on vent   AXIS IV:  other psychosocial or environmental problems and problems with primary support group AXIS V:  51-60 moderate symptoms  Plan:  No evidence of imminent risk to self or others at present.   Patient does not meet criteria for psychiatric inpatient admission. Supportive therapy provided about ongoing stressors. Discussed crisis plan, support from social network, calling 911, coming to the Emergency Department, and calling Suicide Hotline.  Subjective:   [name] [name] is a 42 y.o. male patient.  HPI:  Patient states that he came to hospital because he had an argument with his sister and he doesn't want to live with her.  Patient states that he goes to [facility] to get his medications once a month. Patient states that he is hearing voices that he talks back to.  "I hear the voices but they ain't telling me nothing bad.  No they not telling me to hurt myself or no body; they just want me to move."  Patient denies suicidal/homicidal ideation and paranoia.     Past Psychiatric History: Past Medical History  Diagnosis Date  . Schizophrenia   . Hypertension   . Pneumonia     had aspiration pneumonia 5/14-on vent    reports that he has been smoking Cigarettes.  He has been smoking about 0.25 packs per day. He does not have any smokeless tobacco history on file. He reports that he does not drink alcohol or use illicit drugs. No family history on file.         Allergies:  No Known Allergies  ACT Assessment Complete:  No:   Past Psychiatric History: Diagnosis:  Schizoaffective Disorder  Hospitalizations:  Yes  Outpatient Care:  Yes   Substance Abuse Care:  Denies  Self-Mutilation:  Denies  Suicidal Attempts:  Past history  Homicidal Behaviors:  Denies   Violent Behaviors:  Denies   Place of Residence:  [place].  Patient is suppose to live with sister who is his guardian.  Marital Status:  Single Employed/Unemployed:  Unemployed Education:   Family Supports:  Yes Objective: Blood pressure 152/94, pulse 90, temperature 98.3 F (36.8 C), temperature source Oral, resp. rate 16, SpO2 99.00%.There is no weight on file to calculate BMI.No results found for this or any previous visit (from the past 72 hour(s)).  No current facility-administered medications for this encounter.   Current Outpatient Prescriptions  Medication Sig Dispense Refill  . haloperidol (HALDOL) 10 MG tablet Take 1 tablet (10 mg total) by mouth 2 (two) times daily.  60 tablet  0  . haloperidol decanoate (HALDOL DECANOATE) 100 MG/ML injection Inject 1 mL (100 mg total) into the muscle every 30 (thirty) days. Next dose due on 05/23/13.  1 mL  2  . HYDROcodone-acetaminophen (NORCO/VICODIN) 5-325 MG per tablet Take 2 tablets by mouth every 6 (six) hours as needed.      [name] Kitchen lisinopril (PRINIVIL,ZESTRIL) 10 MG tablet Take 1 tablet (10 mg total) by mouth daily.      . benztropine (COGENTIN) 1 MG tablet Take 1 tablet (1 mg total) by mouth 2 (two) times daily.  60 tablet  0  .  clonazePAM (KLONOPIN) 2 MG tablet Take 2 mg by mouth 2 (two) times daily as needed for anxiety.      . diclofenac (VOLTAREN) 75 MG EC tablet Take 1 tablet by mouth 2 (two) times daily.      . divalproex (DEPAKOTE) 500 MG DR tablet Take 1 tablet (500 mg total) by mouth 2 (two) times daily after a meal.  60 tablet  0  . loxapine (LOXITANE) 25 MG capsule Take 1 capsule by mouth 3 (three) times daily.      [name] Kitchen OLANZapine zydis (ZYPREXA) 10 MG disintegrating tablet Take 1 tablet (10 mg total) by mouth every 12 (twelve) hours as needed (agitation/anger outburst).  30 tablet  0  .  oxyCODONE-acetaminophen (PERCOCET/ROXICET) 5-325 MG per tablet Take 1 tablet by mouth daily.        Psychiatric Specialty Exam:     Blood pressure 152/94, pulse 90, temperature 98.3 F (36.8 C), temperature source Oral, resp. rate 16, SpO2 99.00%.There is no weight on file to calculate BMI.  General Appearance: Casual  Eye Contact::  Good  Speech:  Clear and Coherent and Normal Rate  Volume:  Normal  Mood:  "I don't want to live with my sister"  Affect:  Congruent  Thought Process:  Circumstantial  Orientation:  Full (Time, Place, and Person)  Thought Content:  Hallucinations: Auditory  Suicidal Thoughts:  No  Homicidal Thoughts:  No  Memory:  Immediate;   Good Recent;   Good  Judgement:  Fair  Insight:  Fair  Psychomotor Activity:  Normal  Concentration:  Fair  Recall:  Good  Akathisia:  No  Handed:  Right  AIMS (if indicated):     Assets:  Communication Skills Desire for Improvement Housing  Sleep:      Face to face consult/interview with Dr. Ladona Ridgel.  Treatment Plan Summary: Outpatient follow up  Disposition:  Discharge home with his sister (guardian).  Patient to follow up with primary psychiatric outpatient provider  Assunta Found, FNP-BC 06/26/2013 4:06 PM

## 2013-06-26 NOTE — Progress Notes (Signed)
Pt mother is guardian, [name] [name] [phone]. CSW and PA met with pt in triage room. Pt states that he was living with his sister, however today he decided he didn't want to live with her anymore. Pt states that his sister kicked him out because they had an argument. Pt states that his sister isn't his guardian because they don't have the same fathers. Patient states he wants to go to the behavioral health where they will give him a place to stay for 2 months. Patient states he is having hallucinations. PA asked what kind of hallucinations. Patient said, "Not hallucinations just voices." Patient stated the voices were telling him to not stay still and to keep moving. PA asked patient if the voices were telling him to hurt or kill people, patient replied no. PA asked patient if the voices were telling him to hurt or kill himself, patient stated No. Patient stated he just wanted to go to the small side (reffering to psych ed where pt previously was.) CSW asked patient if he still lives with patient sister. Patient stated until today he did. CSW and pt discussed that patient sister [name] [name] was still patient guardian so patietn would have to live with her until she found a place for him. Pt stated, "Yeah I want to go to a group home." CSW explained that patient guardian will have to find him a group home however patient could not go there today from the ED and that csw would have to speak with pt guardian. CSW and PA discussed that patient does not meet medical or psychiatric criteria to remain in the hospital. PA asked CSW to call patient guardian.   CSW called patient guardian. CSW explained to pt guardian, that patient was in the emergency department at [facility] ED. CSW explained that per the physician patient was not meeting medical or psychiatric reason to stay in the hospital, and that the patient would be discharged. CSW asked if patient guardian come pick pt up. Pt guardian stated, No I'm not going  to come pick him up, he can find his own way back like he did up there." CSW explained that on a previous ed admission patient guardian was not comfortable sending patient home by bus, and we would prefer patient to be picked up by guardian. Patient guardian was asking someone in the background to see if they could pick up patient. CSW asked when patient could be picked up, and patient guardian stated, "I'm going to have to bring my legal  guardianship papers, patient is incompetent and he needs to be admitted." Patient guardian states, "He has been hearing voices and talking to the voices about killing people and putting my mother in a casket." CSW explained to patient guardian that patient has not expressed any of those voices, has not shown any aggression, and per the doctor patient does not meet criteria to medically or psychiatrically stay in the hospital. Patient mother stated, "But he incompetent." CSW acknowledged that patient is incompetent and has a guardian however pt is not presenting a need to stay in the hospital. CSW explained to patient guardian that she was welcome to come and speak to the doctor when she comes to pick up the patient. Patient guardian agreed. Patient guardian asked for this writer's name, which this Clinical research associate provided. CSW informed the PA that patient guardian is coming to discuss patient.   Catha Gosselin, LCSW [phone]  ED CSW .06/26/2013 1446pm

## 2013-06-26 NOTE — Progress Notes (Signed)
ER MD rescinded the IVC.  The nurse will fax the form to the Magistrate and place the original in the file.   Writer provided the patient with outpatient resources.

## 2013-06-27 NOTE — Consult Note (Signed)
Note reviewed and agreed with  

## 2013-07-02 NOTE — ED Provider Notes (Signed)
Medical screening examination/treatment/procedure(s) were performed by non-physician practitioner and as supervising physician I was immediately available for consultation/collaboration.  EKG Interpretation   None         Laray Anger, DO 07/02/13 1026

## 2013-07-05 ENCOUNTER — Emergency Department (HOSPITAL_COMMUNITY)
Admission: EM | Admit: 2013-07-05 | Discharge: 2013-07-15 | Disposition: A | Discharge status: Home / Self Care | Payer: PRIVATE HEALTH INSURANCE | Attending: Emergency Medicine | Admitting: Emergency Medicine

## 2013-07-05 ENCOUNTER — Encounter (HOSPITAL_COMMUNITY): Payer: Self-pay | Admitting: Emergency Medicine

## 2013-07-05 ENCOUNTER — Ambulatory Visit (HOSPITAL_COMMUNITY)
Admission: AD | Admit: 2013-07-05 | Discharge: 2013-07-05 | Disposition: A | Discharge status: Home / Self Care | Payer: 59 | Attending: Psychiatry | Admitting: Psychiatry

## 2013-07-05 ENCOUNTER — Emergency Department (HOSPITAL_COMMUNITY)
Admission: EM | Admit: 2013-07-05 | Discharge: 2013-07-05 | Discharge status: Against Medical Advice | Payer: PRIVATE HEALTH INSURANCE | Source: Home / Self Care

## 2013-07-05 DIAGNOSIS — I10 Essential (primary) hypertension: Secondary | ICD-10-CM | POA: Insufficient documentation

## 2013-07-05 DIAGNOSIS — Z046 Encounter for general psychiatric examination, requested by authority: Secondary | ICD-10-CM | POA: Insufficient documentation

## 2013-07-05 DIAGNOSIS — Z91199 Patient's noncompliance with other medical treatment and regimen due to unspecified reason: Secondary | ICD-10-CM | POA: Insufficient documentation

## 2013-07-05 DIAGNOSIS — F17200 Nicotine dependence, unspecified, uncomplicated: Secondary | ICD-10-CM | POA: Insufficient documentation

## 2013-07-05 DIAGNOSIS — F250 Schizoaffective disorder, bipolar type: Secondary | ICD-10-CM

## 2013-07-05 DIAGNOSIS — F200 Paranoid schizophrenia: Secondary | ICD-10-CM | POA: Insufficient documentation

## 2013-07-05 DIAGNOSIS — Z79899 Other long term (current) drug therapy: Secondary | ICD-10-CM | POA: Insufficient documentation

## 2013-07-05 DIAGNOSIS — Z9119 Patient's noncompliance with other medical treatment and regimen: Secondary | ICD-10-CM

## 2013-07-05 DIAGNOSIS — Z8701 Personal history of pneumonia (recurrent): Secondary | ICD-10-CM | POA: Insufficient documentation

## 2013-07-05 DIAGNOSIS — F319 Bipolar disorder, unspecified: Secondary | ICD-10-CM | POA: Insufficient documentation

## 2013-07-05 DIAGNOSIS — R454 Irritability and anger: Secondary | ICD-10-CM

## 2013-07-05 LAB — RAPID URINE DRUG SCREEN, HOSP PERFORMED
Amphetamines: NOT DETECTED
Cocaine: NOT DETECTED
Opiates: NOT DETECTED
Tetrahydrocannabinol: NOT DETECTED

## 2013-07-05 LAB — COMPREHENSIVE METABOLIC PANEL
BUN: 10 mg/dL (ref 6–23)
Calcium: 9.2 mg/dL (ref 8.4–10.5)
Creatinine, Ser: 0.8 mg/dL (ref 0.50–1.35)
GFR calc Af Amer: >90 mL/min (ref >90)
GFR calc non Af Amer: >90 mL/min (ref >90)
Glucose, Bld: 95 mg/dL (ref 70–99)
Sodium: 135 mEq/L (ref 135–145)
Total Protein: 7 g/dL (ref 6.0–8.3)

## 2013-07-05 LAB — URINALYSIS, ROUTINE W REFLEX MICROSCOPIC
Bilirubin Urine: NEGATIVE
Hgb urine dipstick: NEGATIVE
Ketones, ur: NEGATIVE mg/dL
Leukocytes, UA: NEGATIVE
Nitrite: NEGATIVE
Specific Gravity, Urine: 1.008 (ref 1.005–1.030)
Urobilinogen, UA: 1 mg/dL (ref 0.0–1.0)
pH: 7.5 (ref 5.0–8.0)

## 2013-07-05 LAB — CBC
HCT: 42.4 % (ref 39.0–52.0)
Hemoglobin: 14.1 g/dL (ref 13.0–17.0)
MCH: 28.4 pg (ref 26.0–34.0)
MCHC: 33.3 g/dL (ref 30.0–36.0)
MCV: 85.5 fL (ref 78.0–100.0)
RDW: 14.6 % (ref 11.5–15.5)

## 2013-07-05 LAB — ETHANOL: Alcohol, Ethyl (B): <11 mg/dL (ref 0–11)

## 2013-07-05 MED ORDER — HALOPERIDOL 5 MG PO TABS
10.0000 mg | ORAL_TABLET | Freq: Two times a day (BID) | ORAL | Status: DC
  Administered 2013-07-05 – 2013-07-15 (×20): 10 mg via ORAL
  Filled 2013-07-05 (×21): qty 2

## 2013-07-05 MED ORDER — LISINOPRIL 10 MG PO TABS
10.0000 mg | ORAL_TABLET | Freq: Every day | ORAL | Status: DC
  Administered 2013-07-05 – 2013-07-15 (×10): 10 mg via ORAL
  Filled 2013-07-05 (×11): qty 1

## 2013-07-05 MED ORDER — OLANZAPINE 10 MG PO TBDP
10.0000 mg | ORAL_TABLET | Freq: Two times a day (BID) | ORAL | Status: DC | PRN
  Administered 2013-07-06 – 2013-07-14 (×11): 10 mg via ORAL
  Filled 2013-07-05 (×11): qty 1

## 2013-07-05 MED ORDER — DIVALPROEX SODIUM 500 MG PO DR TAB
500.0000 mg | DELAYED_RELEASE_TABLET | Freq: Two times a day (BID) | ORAL | Status: DC
  Administered 2013-07-05: 500 mg via ORAL
  Filled 2013-07-05: qty 1

## 2013-07-05 MED ORDER — HALOPERIDOL DECANOATE 100 MG/ML IM SOLN
100.0000 mg | INTRAMUSCULAR | Status: DC
  Administered 2013-07-05: 100 mg via INTRAMUSCULAR
  Filled 2013-07-05 (×2): qty 1

## 2013-07-05 MED ORDER — CLONAZEPAM 1 MG PO TABS
2.0000 mg | ORAL_TABLET | Freq: Two times a day (BID) | ORAL | Status: DC | PRN
  Administered 2013-07-06 – 2013-07-14 (×11): 2 mg via ORAL
  Filled 2013-07-05 (×12): qty 2

## 2013-07-05 MED ORDER — BENZTROPINE MESYLATE 1 MG PO TABS
1.0000 mg | ORAL_TABLET | Freq: Two times a day (BID) | ORAL | Status: DC
  Administered 2013-07-05 – 2013-07-15 (×20): 1 mg via ORAL
  Filled 2013-07-05 (×20): qty 1

## 2013-07-05 MED ORDER — LOXAPINE SUCCINATE 25 MG PO CAPS
25.0000 mg | ORAL_CAPSULE | Freq: Four times a day (QID) | ORAL | Status: DC
  Administered 2013-07-05 – 2013-07-15 (×36): 25 mg via ORAL
  Filled 2013-07-05 (×41): qty 1

## 2013-07-05 NOTE — ED Notes (Signed)
Pt from [facility], does not have a room as they state he needs medication and should be able to be sent home, pt denies SI/HI, does state he has AH that "yell at me." Pt states they do not tell him to hurt himself or anyone else. Pt seen at [facility] where he is given IM injections of his medication. Pt calm and cooperative at this time.  Pt did have a confrontation with his sister in the lobby, sister states she is his legal guardian and he lives with her. Sister states he will make comments that "every body is going to die and I'm going to be in a coffin" she states pt will act out when he is not allowed to smoke or have coffee. Pt and sister both state he needs placement at a group home, but needs a TB test first.  Pt a&o, denies pain or further needs at this time.

## 2013-07-05 NOTE — ED Notes (Signed)
Spoke with the PA about IM and PO Haldol, and these are his regularly scheduled medications

## 2013-07-05 NOTE — ED Notes (Signed)
When this writer  called patient to check vitals , was informed by security that patient had left.

## 2013-07-05 NOTE — ED Provider Notes (Signed)
CSN: [id number]     Arrival date & time 07/05/13  2005 History  This chart was scribed for non-physician practitioner, Wylene Simmer, PA-C,working with Junius Argyle, MD, by Karle Plumber, ED Scribe.  This patient was seen in room WLCON/WLCON and the patient's care was started at 9:16 PM.  Chief Complaint  Patient presents with  . Medical Clearance   The history is provided by the patient. No language interpreter was used.   HPI Comments:  [name] [name] is a 42 y.o. male who presents to the Emergency Department complaining of placement in a group home. Pt states he hallucinates and hears voices. He states the voices tell him to scream out loud. He states he used to live with his sister, but no longer lives there anymore. He reports feeling depressed and wants to live with his stepmother. He states his appetite is fine and reports being hungry right now. Pt denies suicidal ideations.   Past Medical History  Diagnosis Date  . Schizophrenia   . Hypertension   . Pneumonia     had aspiration pneumonia 5/14-on vent   Past Surgical History  Procedure Laterality Date  . Arm surgery      hurt his arm 25 hr ago  . Open reduction internal fixation (orif) proximal phalanx Right 04/02/2013    Procedure: OPEN REDUCTION INTERNAL FIXATION (ORIF) RIGHT SMALL FINGER;  Surgeon: Tami Ribas, MD;  Location:  SURGERY CENTER;  Service: Orthopedics;  Laterality: Right;   History reviewed. No pertinent family history. History  Substance Use Topics  . Smoking status: Current Every Day Smoker -- 0.25 packs/day    Types: Cigarettes  . Smokeless tobacco: Not on file  . Alcohol Use: No     Comment: not now    Review of Systems  Constitutional: Negative for appetite change.  Psychiatric/Behavioral: Positive for hallucinations. Negative for suicidal ideas and self-injury.  All other systems reviewed and are negative.    Allergies  Review of patient's allergies indicates no known  allergies.  Home Medications   Current Outpatient Rx  Name  Route  Sig  Dispense  Refill  . benztropine (COGENTIN) 1 MG tablet   Oral   Take 1 mg by mouth 2 (two) times daily.          . clonazePAM (KLONOPIN) 2 MG tablet   Oral   Take 2 mg by mouth 2 (two) times daily as needed for anxiety.         . divalproex (DEPAKOTE) 500 MG DR tablet   Oral   Take 500-1,000 mg by mouth 2 (two) times daily. 1 tablet in the morning and 2 tablets at night         . haloperidol (HALDOL) 10 MG tablet   Oral   Take 1 tablet (10 mg total) by mouth 2 (two) times daily.   60 tablet   0   . haloperidol decanoate (HALDOL DECANOATE) 100 MG/ML injection   Intramuscular   Inject 1 mL (100 mg total) into the muscle every 30 (thirty) days. Next dose due on 05/23/13.   1 mL   2   . HYDROcodone-acetaminophen (NORCO/VICODIN) 5-325 MG per tablet   Oral   Take 2 tablets by mouth every 6 (six) hours as needed for moderate pain.          [name] Kitchen lisinopril (PRINIVIL,ZESTRIL) 10 MG tablet   Oral   Take 1 tablet (10 mg total) by mouth daily.         [name] Kitchen  loxapine (LOXITANE) 25 MG capsule   Oral   Take 1 capsule by mouth 4 (four) times daily.          [name] Kitchen OLANZapine zydis (ZYPREXA) 10 MG disintegrating tablet   Oral   Take 1 tablet (10 mg total) by mouth every 12 (twelve) hours as needed (agitation/anger outburst).   30 tablet   0    Triage Vitals: BP 152/105  Pulse 88  Temp(Src) 98.5 F (36.9 C) (Oral)  Resp 16  SpO2 98% Physical Exam  Nursing note and vitals reviewed. Constitutional: He is oriented to person, place, and time. He appears well-developed and well-nourished.  HENT:  Head: Normocephalic and atraumatic.  Eyes: EOM are normal.  Neck: Normal range of motion.  Cardiovascular: Normal rate.   Pulmonary/Chest: Effort normal.  Musculoskeletal: Normal range of motion.  Neurological: He is alert and oriented to person, place, and time. He exhibits normal muscle tone. Coordination  normal.  Skin: Skin is warm and dry.  Psychiatric: Judgment normal. His affect is blunt. His speech is tangential. He is actively hallucinating. Cognition and memory are normal. He exhibits a depressed mood. He expresses no homicidal and no suicidal ideation.    ED Course  Procedures (including critical care time) DIAGNOSTIC STUDIES: Oxygen Saturation is 98% on RA, normal by my interpretation.   COORDINATION OF CARE: 9:22 PM- Will get pt a sandwich and obtain routine blood work for medical clearance. Pt verbalizes understanding and agrees to plan.  Medications - No data to display  Labs Review Labs Reviewed  CBC - Abnormal; Notable for the following:    Platelets 114 (*)    All other components within normal limits  COMPREHENSIVE METABOLIC PANEL - Abnormal; Notable for the following:    Total Bilirubin 0.1 (*)    All other components within normal limits  SALICYLATE LEVEL - Abnormal; Notable for the following:    Salicylate Lvl <2.0 (*)    All other components within normal limits  ACETAMINOPHEN LEVEL  ETHANOL  URINE RAPID DRUG SCREEN (HOSP PERFORMED)  URINALYSIS, ROUTINE W REFLEX MICROSCOPIC   Imaging Review No results found.  EKG Interpretation   None      Results for orders placed during the hospital encounter of 07/05/13  ACETAMINOPHEN LEVEL      Result Value Range   Acetaminophen (Tylenol), Serum <15.0  10 - 30 ug/mL  CBC      Result Value Range   WBC 9.0  4.0 - 10.5 K/uL   RBC 4.96  4.22 - 5.81 MIL/uL   Hemoglobin 14.1  13.0 - 17.0 g/dL   HCT 16.1  09.6 - 04.5 %   MCV 85.5  78.0 - 100.0 fL   MCH 28.4  26.0 - 34.0 pg   MCHC 33.3  30.0 - 36.0 g/dL   RDW 40.9  81.1 - 91.4 %   Platelets 114 (*) 150 - 400 K/uL  COMPREHENSIVE METABOLIC PANEL      Result Value Range   Sodium 135  135 - 145 mEq/L   Potassium 4.6  3.5 - 5.1 mEq/L   Chloride 99  96 - 112 mEq/L   CO2 29  19 - 32 mEq/L   Glucose, Bld 95  70 - 99 mg/dL   BUN 10  6 - 23 mg/dL   Creatinine, Ser  7.82  0.50 - 1.35 mg/dL   Calcium 9.2  8.4 - 95.6 mg/dL   Total Protein 7.0  6.0 - 8.3 g/dL   Albumin 3.7  3.5 - 5.2 g/dL   AST 30  0 - 37 U/L   ALT 42  0 - 53 U/L   Alkaline Phosphatase 65  39 - 117 U/L   Total Bilirubin 0.1 (*) 0.3 - 1.2 mg/dL   GFR calc non Af Amer >90  >90 mL/min   GFR calc Af Amer >90  >90 mL/min  ETHANOL      Result Value Range   Alcohol, Ethyl (B) <11  0 - 11 mg/dL  SALICYLATE LEVEL      Result Value Range   Salicylate Lvl <2.0 (*) 2.8 - 20.0 mg/dL  URINE RAPID DRUG SCREEN (HOSP PERFORMED)      Result Value Range   Opiates NONE DETECTED  NONE DETECTED   Cocaine NONE DETECTED  NONE DETECTED   Benzodiazepines NONE DETECTED  NONE DETECTED   Amphetamines NONE DETECTED  NONE DETECTED   Tetrahydrocannabinol NONE DETECTED  NONE DETECTED   Barbiturates NONE DETECTED  NONE DETECTED  URINALYSIS, ROUTINE W REFLEX MICROSCOPIC      Result Value Range   Color, Urine YELLOW  YELLOW   APPearance CLEAR  CLEAR   Specific Gravity, Urine 1.008  1.005 - 1.030   pH 7.5  5.0 - 8.0   Glucose, UA NEGATIVE  NEGATIVE mg/dL   Hgb urine dipstick NEGATIVE  NEGATIVE   Bilirubin Urine NEGATIVE  NEGATIVE   Ketones, ur NEGATIVE  NEGATIVE mg/dL   Protein, ur NEGATIVE  NEGATIVE mg/dL   Urobilinogen, UA 1.0  0.0 - 1.0 mg/dL   Nitrite NEGATIVE  NEGATIVE   Leukocytes, UA NEGATIVE  NEGATIVE   No results found.  1:26 AM Patient medically stable, though he is hearing voices he is stable but out of medication, he is not suicidal or homicidal at this time.  He currently has no place to live and either needs a group home or he would really like to go live with his step-mother.  He is pleasant and alert at this time.  MDM  Medication non-compliance Depression Schizophrenia  Patient with history of schizophrenia, stable on medications presents after being off medications for the past week - he reports hallucinations but is able to manage them.  He denies suicidal or homicidal ideation at  this time.  Will likely require placement.  I personally performed the services described in this documentation, which was scribed in my presence. The recorded information has been reviewed and is accurate.    Izola Price Marisue Humble, PA-C 07/06/13 0129

## 2013-07-05 NOTE — BH Assessment (Signed)
Tele Assessment Note   [name] [name] is an 42 y.o. male, single, African-American who presents unaccompanied to [facility] after leaving [facility] because "they told me it would be a long wait." Pt has a history of schizoaffective disorder and is currently seen by [facility] for outpatient treatment. Pt reports he has been off psychiatric medications for one week because he ran out. He reports having auditory hallucinations telling him not to be around his family. Pt's sister is his legal guardian. Pt reports he has been staying with a friend. Pt reports the voices are not telling him to harm himself or anyone else. Pt denies current suicidal ideation and denies any history of self-harm. Pt denies homicidal ideation or a history of violence. Pt reports he has a history of substance abuse in the past but denies any recent use. Pt describes his mood as "sad" due to the death of his father last month. Pt denies problems with sleep or appetite. Pt states he has been hospitalized in the past and would like to be admitted to a psychiatric facility to restart his medications and be referred to a different place to live.  Pt reports auditory hallucinations and appears to be responding to internal stimuli. When Pt was alone in the bathroom he could be heard talking and arguing with someone who was not there. Pt's chart indicates he has had similar behavior in the past.  Pt was admitted to [facility] in October 2014 and has been seen in the emergency department recently. He has also been admitted to [facility]/or [facility], possibly to a forensic unit, as well as [facility]. He started receiving outpatient treatment through the [facility] when he was about 42 y/o, and continued receiving services from them when [place] took control of the facility. The sister was not satisfied with the care that they provided, and around 12/2012 he started receiving outpatient psychiatry from [name], NP at [facility], [phone]. He is currently receiving treatment through [facility].  Pt is casually dressed, alert, oriented x4 with normal speech and motor behavior. His eye contact was good but he appeared distracted at times by internal stimuli. He thought process is coherent and relevant. Mood is sad and affect is appropriate to circumstance. Pt was calm and polite throughout assessment. He is seeking inpatient psychiatric treatment.  Gillis Santa, registration MHT who accompanied Pt back to [facility], reports that Pt's sister approached Pt while he was being admitted to the ED and she was very angry and agitated. Sister stated that Pt had been threatening to her.   Axis I: Schizoaffective Disorder Axis II: Deferred Axis III:  Past Medical History  Diagnosis Date  . Schizophrenia   . Hypertension   . Pneumonia     had aspiration pneumonia 5/14-on vent   Axis IV: housing problems and problems with primary support group Axis V: GAF=35  Past Medical History:  Past Medical History  Diagnosis Date  . Schizophrenia   . Hypertension   . Pneumonia     had aspiration pneumonia 5/14-on vent    Past Surgical History  Procedure Laterality Date  . Arm surgery      hurt his arm 25 hr ago  . Open reduction internal fixation (orif) proximal phalanx Right 04/02/2013    Procedure: OPEN REDUCTION INTERNAL FIXATION (ORIF) RIGHT SMALL FINGER;  Surgeon: Tami Ribas, MD;  Location: [facility];  Service: Orthopedics;  Laterality: Right;    Family History: No  family history on file.  Social History:  reports that he has been smoking Cigarettes.  He has been smoking about 0.25 packs per day. He does not have any smokeless tobacco history on file. He reports that he does not drink alcohol or use illicit drugs.  Additional Social History:  Alcohol / Drug Use Pain Medications: Denies abuse Prescriptions: Denies abuse Over the Counter: Denies abuse History of alcohol / drug use?:  (Pt reports a  history of substance abuse but denies recent use) Longest period of sobriety (when/how long): 2 years  CIWA:   COWS:    Allergies: No Known Allergies  Home Medications:  (Not in a hospital admission)  OB/GYN Status:  No LMP for male patient.  General Assessment Data Location of Assessment: [facility] Is this a Tele or Face-to-Face Assessment?: Face-to-Face Is this an Initial Assessment or a Re-assessment for this encounter?: Initial Assessment Living Arrangements: Other (Comment) (Currently staying with a friend) Can pt return to current living arrangement?: No Admission Status: Voluntary Is patient capable of signing voluntary admission?: Yes Transfer from: Other (Comment) (Pt left [facility] and came to [facility]) Referral Source: Self/Family/Friend  Medical Screening Exam [facility] Walk-in ONLY) Medical Exam completed: No Reason for MSE not completed: Other: (Pt transferred to [facility] for medical clearance)  [facility] Crisis Care Plan Living Arrangements: Other (Comment) (Currently staying with a friend) Name of Psychiatrist: Vesta Mixer Name of Therapist: [facility]  Education Status Is patient currently in school?: No Current Grade: NA Highest grade of school patient has completed: NA Name of school: NA Contact person: NA  Risk to self Suicidal Ideation: No Suicidal Intent: No Is patient at risk for suicide?: No Suicidal Plan?: No Access to Means: No What has been your use of drugs/alcohol within the last 12 months?: Pt denies any recent alcohol or substance use Previous Attempts/Gestures: No How many times?: 0 Other Self Harm Risks: Pt is responding to auditory hallucinations Triggers for Past Attempts: None known Intentional Self Injurious Behavior: None Family Suicide History: No Recent stressful life event(s): Other (Comment) (Ran out of medications) Persecutory voices/beliefs?: No Depression: Yes Depression Symptoms: Despondent Substance abuse history and/or  treatment for substance abuse?: No Suicide prevention information given to non-admitted patients: Not applicable  Risk to Others Homicidal Ideation: No Thoughts of Harm to Others: No Current Homicidal Intent: No Current Homicidal Plan: No Access to Homicidal Means: No Identified Victim: None History of harm to others?: No Assessment of Violence: None Noted Violent Behavior Description: None Does patient have access to weapons?: No Criminal Charges Pending?: No Does patient have a court date: No  Psychosis Hallucinations: Auditory;With command (Voices telling him not to be around family) Delusions: None noted  Mental Status Report Appear/Hygiene: Other (Comment) (Casually dressed) Eye Contact: Good Motor Activity: Unremarkable Speech: Logical/coherent Level of Consciousness: Alert Mood: Sad Affect: Appropriate to circumstance Anxiety Level: None Thought Processes: Coherent;Relevant Judgement: Impaired Orientation: Person;Place;Time;Situation Obsessive Compulsive Thoughts/Behaviors: None  Cognitive Functioning Concentration: Decreased Memory: Recent Intact;Remote Intact IQ: Average Insight: Fair Impulse Control: Fair Appetite: Good Weight Loss: 0 Weight Gain: 0 Sleep: No Change Total Hours of Sleep: 9 Vegetative Symptoms: None  ADLScreening [facility]) Patient's cognitive ability adequate to safely complete daily activities?: Yes Patient able to express need for assistance with ADLs?: Yes Independently performs ADLs?: Yes (appropriate for developmental age)  Prior Inpatient Therapy Prior Inpatient Therapy: Yes Prior Therapy Dates: Multiple admits Prior Therapy Facilty/Provider(s): various facilities Reason for Treatment: Schizoaffective disorder  Prior Outpatient Therapy Prior  Outpatient Therapy: Yes Prior Therapy Dates: ongoing Prior Therapy Facilty/Provider(s): [facility] Reason for Treatment: schizoaffective disorder  ADL Screening  (condition at time of admission) Patient's cognitive ability adequate to safely complete daily activities?: Yes Is the patient deaf or have difficulty hearing?: No Does the patient have difficulty seeing, even when wearing glasses/contacts?: No Does the patient have difficulty concentrating, remembering, or making decisions?: No Patient able to express need for assistance with ADLs?: Yes Does the patient have difficulty dressing or bathing?: No Independently performs ADLs?: Yes (appropriate for developmental age) Does the patient have difficulty walking or climbing stairs?: No Weakness of Legs: None Weakness of Arms/Hands: None  Home Assistive Devices/Equipment Home Assistive Devices/Equipment: None    Abuse/Neglect Assessment (Assessment to be complete while patient is alone) Physical Abuse: Denies Verbal Abuse: Denies Sexual Abuse: Denies Exploitation of patient/patient's resources: Denies Self-Neglect: Denies     Merchant navy officer (For Healthcare) Advance Directive: Patient has advance directive, copy in chart Type of Advance Directive: Healthcare Power of [place];Advance instruction for mental health treatment Does patient want anything changed on advanced directive?: No Pre-existing out of facility DNR order (yellow form or pink MOST form): No Nutrition Screen- [facility] Adult/[facility] Patient's home diet: Regular  Additional Information 1:1 In Past 12 Months?: No CIRT Risk: No Elopement Risk: No Does patient have medical clearance?: No     Disposition:  Disposition Initial Assessment Completed for this Encounter: Yes Disposition of Patient: Other dispositions Other disposition(s): Other (Comment) (Transferred to [facility] for medical clearance and further evalua)  Consulted with Alberteen Sam, NP who agreed Pt needs to return to [facility] for medical clearance and further evaluation. Pt is not accepted nor declined at [facility] at this time. Contacted Victorino Dike, Consulting civil engineer at [employer], and  gave report. Pt was transported to [employer] via [employer] and [facility] staff.  Pamalee Leyden, [facility], [facility] Triage Specialist   Davonna Belling Cheri Kearns 07/05/2013 8:23 PM

## 2013-07-06 DIAGNOSIS — Z9119 Patient's noncompliance with other medical treatment and regimen: Secondary | ICD-10-CM

## 2013-07-06 MED ORDER — DIVALPROEX SODIUM 500 MG PO DR TAB
500.0000 mg | DELAYED_RELEASE_TABLET | Freq: Two times a day (BID) | ORAL | Status: DC
  Administered 2013-07-06 – 2013-07-15 (×19): 500 mg via ORAL
  Filled 2013-07-06 (×19): qty 1

## 2013-07-06 MED ORDER — LORAZEPAM 2 MG/ML IJ SOLN
1.0000 mg | Freq: Once | INTRAMUSCULAR | Status: AC
  Administered 2013-07-06: 1 mg via INTRAMUSCULAR
  Filled 2013-07-06: qty 1

## 2013-07-06 MED ORDER — ACETAMINOPHEN 325 MG PO TABS
650.0000 mg | ORAL_TABLET | Freq: Three times a day (TID) | ORAL | Status: DC | PRN
  Administered 2013-07-06 – 2013-07-10 (×2): 650 mg via ORAL
  Filled 2013-07-06 (×3): qty 2

## 2013-07-06 NOTE — BH Assessment (Signed)
[facility] Assessment Progress Note 07/06/13. 1600.  Spoke with pt's sister/legal guardian and obtained copy of guardianship order, which was put on pt chart. Daleen Squibb, LCSW

## 2013-07-06 NOTE — Progress Notes (Signed)
CSW met with pt. Pt states he is ready to leave hospital. States he will stay with a friend, Waynard Edwards. CSW and pt discussed possibility of living in a group home in the future. Pt stated he would like a care coordinator with [facility]. CSW will arrange this.   CSW spoke with pt's guardian, Colan Laymon. Ms. Sheard states she wants pt to be placed in group home from hospital. Ms. Bodey informed CSW that Waynard Edwards had passed away. Ms. Hakim expressed anger that hospital would be unable to keep pt and place into group home. Ms. Allman stated that she will not pick up patient and let him stay with her while he awaits group home placement to be facilitated by [facility] care coordinator.   Ms. Schoon stated she wanted to speak with a medical professional above CSW. CSW contacted [facility], who agreed to contact Ms. Dier.   York Spaniel [place], [phone]     ED CSW  12:33pm

## 2013-07-06 NOTE — [facility] Counselor (Signed)
AC called about pt.  Dr. Elsie Saas wants SW to confirm stories with sister  Sister is going to take IVC papers out  Need Custody papers from sister indicating she has custody

## 2013-07-06 NOTE — Progress Notes (Addendum)
CSW met with pt to tell him that he will not be d/c tonight. Pt agreed with plan to check-in tomorrow AM.  [name], [phone]     ED CSW  3:40pm ____________  CSW put in request for PASSR review (PASSR # is necessary for any group home placement). State reviewer must come and evaluate pt before number can be assigned.  MUST ID reference is [id number].   [place] Spaniel [place], [phone]     ED CSW  4:00pm

## 2013-07-06 NOTE — Consult Note (Signed)
  [name] with Dr Ozella Rocks.Pt requesting D/C to friends/family.States he has place to stay while he resolves issues with sister who is his legal guardian.Denies HI/SI. Currently compliant with meds  O-Pt is alert,oriented,appropriate and comprehensible.Denies HI/SI as noted  A- Schizoaffective disorder MRE is unknown   P- Observe in ER and obtain further info from his family and also legal guardianship documents and continue current medications  Patient is seen during round for face to face psych evaluation and case discussed with physician extender. Reviewed the information documented and agree with the treatment plan.   [name] [name],[name] R. 07/07/2013 12:40 PM

## 2013-07-06 NOTE — ED Provider Notes (Signed)
Medical screening examination/treatment/procedure(s) were performed by non-physician practitioner and as supervising physician I was immediately available for consultation/collaboration.  EKG Interpretation   None         Tanganika Barradas S Miosotis Wetsel, MD 07/06/13 1257 

## 2013-07-06 NOTE — ED Notes (Signed)
Pt transferred from triage, presents for medical clearance.  Pt lives with sister who is legal guardian, she states he has been making statements like everyone is going to die, and acting out when he can't get coffee or is not allowed to smoke.  Pt reports he wants to live in a new group home.  Denies SI or HI, pt follows up outpt at [facility].  Pt calm & cooperative at present.

## 2013-07-06 NOTE — ED Notes (Signed)
Writer did not obtained VS, allowing pt to rest. RN notified

## 2013-07-06 NOTE — Progress Notes (Signed)
CSW contacted [facility] to make referral for care coordinator.  CSW completed referral with clinician Claris Che.  [place] Spaniel [place], [phone]     ED CSW  2:23pm

## 2013-07-06 NOTE — Progress Notes (Signed)
Patient ID: [name] [name], male   DOB: 05-04-1971, 42 y.o.   MRN: [id number]  Patient is seen during morning rounds with physician extender and chart reviewed. Patient denied current symptoms of depression, anxiety and psychosis. He has stated that he had an verbal argument with his sister and not taken his medication over a week. He wishes to go and live with his friend and follow up with out patient treatment at [facility]. Patient step mother is concern about his safety and wants to be stabilized in hospital and willing to do IVC petition for him. Spoke with [facility] and counselor in ER and hold discharge. Will ask social service to obtain further information and also guardianship documents from family for further evaluation.   [name] [name],[name] 07/06/2013 2:38 PM

## 2013-07-06 NOTE — ED Notes (Signed)
Pt hollering, screaming out, cursing, positive AV hallucinations.  Dr Dierdre Highman contacted for medication.  Orders given.

## 2013-07-07 ENCOUNTER — Encounter (HOSPITAL_COMMUNITY): Payer: Self-pay | Admitting: Registered Nurse

## 2013-07-07 MED ORDER — NICOTINE 21 MG/24HR TD PT24
21.0000 mg | MEDICATED_PATCH | Freq: Every day | TRANSDERMAL | Status: DC
  Administered 2013-07-07 – 2013-07-15 (×7): 21 mg via TRANSDERMAL
  Filled 2013-07-07 (×8): qty 1

## 2013-07-07 NOTE — Progress Notes (Addendum)
CSW called DSS to consult on how guardianship status affects dispo plans. Worker could not assist CSW as she does not specialize in adult cases. APS worker has been paged; CSW awaiting return call.  [name], [phone]     ED CSW  10:46am _________  CSW spoke with DSS worker [name]. Per [name], since pt is a ward of guardian [name] [name] she is responsible for finding alternative living arrangements for patient. If pt is medically and psychiatrically cleared by hospital staff to be safe and capable of caring for himself, pt may be legally d/c. [name] provided name of DSS worker who can help guardian search for alternative placement: [name] [name] at [phone].  [name], [phone]     ED CSW  __________  CSW spoke with [name] at [name]. [name] is looking for bed availability and will call CSW back shortly.  [name], [phone]     ED CSW  11:55am  _____________  [name] confirms that pt will have bed at [facility] if he can get to facility by 3pm.  [name], [phone]     ED CSW  12:15pm _____________  CSW filed APS report with DSS worker [name]. Per [name], hospital may d/c pt to the secured [facility] bed.   CSW looking into group home placement options. CSW met with pt. Pt states he does not receive an SSI or disability check, though he confirms that he does have Medicaid. Pt cannot enter a group home without an income. CSW LM for pt's guardian, to ensure he does not receive this kind of check. CSW awaiting return call.   [name], [phone]     ED CSW  12:19pm

## 2013-07-07 NOTE — Progress Notes (Addendum)
After consultation with asst. CSW director, Wandra Mannan, safest dispo for pt is to be placed directly into group home. CSW in contact with [facility]. They need FL2 and psych eval--CSW to fax.  York Spaniel [place], [phone]     ED CSW  1:20pm  ________  CSW faxed FL2 and psych eval to [phone], [facility] fax number. CSW has left message for [employer] [phone]).   MUST ID reference (for PASSR) is [id number]. CSW yesterday put in referral to [facility] for pt to get care coordinator (see prior CSW note).   Pt guardian is aware that CSW has started preliminary steps of placement process.  York Spaniel [place], [phone]     ED CSW  4:15pm

## 2013-07-07 NOTE — Consult Note (Signed)
  HPI: Patient is seen and chart reviewed, case discussed with physician extender, ER social service, Staff RN and therapist. Patient has an episode of verbal agitation when notified he was not discharged from ER yesterday afternoon. His guardian has made IVC petition saying he is danger to himself and others based on his history and previous behaviors. Patient and his guardian stated that he needs to be placed in out of home and guardian refusing to take him home when he does not required in patient stay. Patient is agree to go to group home and asks if his sister will be able to pick. He will call his sister to find the information. Denies HI/SI and psychosis.   MSE: Patient is alert,oriented, mood and affect is appropriate and speech is comprehensible.Denies HI/SI and psychosis  Assessment: Schizoaffective disorder MRE is unknown   Plan:  Continue current medication management Social service will work with his sister regarding finding shelter / group home placement Observe in ER and obtain further info from his family and also legal guardianship documents  May rescind IVC if placement is available and may need TB test.  [name] [name],[name] [name]. 07/07/2013 12:55 PM

## 2013-07-08 NOTE — Progress Notes (Signed)
Patient ID: [name] [name], male   DOB: [birth year], 42 y.o.   MRN: [id number]  S: Pt was interviewed with NP Josephine. Chart reviewed. Case discussed with SW. Pt reports being ready to go to a group home as soon as possible, but is awaiting his TB test to be done. Pt reports his mood is "happy" today. Sleep and appetite are good. Tolerating meds. Pt denies SI/HI/AVH.  Psychiatric Specialty Exam: Physical Exam  ROS  Blood pressure 150/97, pulse 84, temperature 97.8 F (36.6 C), temperature source Oral, resp. rate 20, SpO2 99.00%.There is no weight on file to calculate BMI.  General Appearance: Disheveled  Eye Solicitor::  Fair  Speech:  Normal Rate  Volume:  Normal  Mood:  Euthymic  Affect:  Congruent and Constricted  Thought Process:  Goal Directed  Orientation:  Full (Time, Place, and Person)  Thought Content:  Hallucinations: None  Suicidal Thoughts:  No  Homicidal Thoughts:  No  Memory:  Negative  Judgement:  Fair  Insight:  Fair  Psychomotor Activity:  Normal  Concentration:  Fair  Recall:  Fair  Akathisia:  Negative  Handed:  Right  AIMS (if indicated):     Assets:  Architect Social Support  Sleep:      A: diagnoses unchanged.  Plan: Will continue to pursue group home placement, since sister will not allow pt to return home. Will order TB skin test if needed. Will keep pt in ED until group home placement is found. Adult CPS has been notified. Continue current meds. IVC has been rescinded by Dr. Elsie Saas.  Ancil Linsey, MD

## 2013-07-08 NOTE — Progress Notes (Addendum)
CSW spoke with Director and Weekend CSW regarding patient. Pt pending group home placement. APS report also completed by weekend csw. CSW left message for Joselyn Glassman who is group home owner considering patient for placement.   Frutoso Schatz [phone]  ED CSW 07/08/2013 1005am   CSW made several attempts to reach kelly bennet, and no return call yet. CSW expanding group home search for patient. CSW to continue to seek placement tomorrow.   Catha Gosselin, LCSW [phone]  ED CSW .07/08/2013 1533pm

## 2013-07-08 NOTE — ED Notes (Signed)
Patient continues to walk around responding to internal sand external stimuli. Patient walking and arguing with himself. Encouragement and support provided and safety maintain.

## 2013-07-09 DIAGNOSIS — R454 Irritability and anger: Secondary | ICD-10-CM

## 2013-07-09 MED ORDER — TUBERCULIN PPD 5 UNIT/0.1ML ID SOLN
5.0000 [IU] | Freq: Once | INTRADERMAL | Status: AC
  Administered 2013-07-09: 5 [IU] via INTRADERMAL
  Filled 2013-07-09: qty 0.1

## 2013-07-09 NOTE — Progress Notes (Signed)
Patient observed sleeping, respirations even and unlabored. Patient safety maintained, Q 15 checks continue.  

## 2013-07-09 NOTE — ED Notes (Signed)
Patient in front of nurses station shadow boxing. No acute distress. Safety maintain.

## 2013-07-09 NOTE — Progress Notes (Signed)
CSW spoke with [name] [name], who is hoping to interview [name] [name] today between 1-3pm. CSW and NP expained to [name] [name] that [name] [name] will have to remain in the ED while waiting for his tb test to result. [name] [name] remained calm and coopeartive and verbalized understanding. [name] [name] verbalized understanding tha tpatient will most likely be in the ED until Friday.   Catha Gosselin, LCSW [phone]  ED CSW .07/09/2013 928am

## 2013-07-09 NOTE — ED Notes (Signed)
Pt is agitated, pacing hallways, yelling about wanting to leave today, pt states "Either I'm going home or I'm going to jail today.", see MAR for prn meds administered, pt denies AH but appears to be responding to internal stimuli, denies SI/HI/VH

## 2013-07-09 NOTE — Consult Note (Signed)
   HPI: Patient walking in hall asking when can he leave.  Patient states that he is ready to go home.  Patient has bee corporative.  Informed patient that we were working on finding him a group home and wasn't sure of how long it would take.  Also informed patient that he needed to remain calm and not get up set with the staff because everyone was doing everything they could with him.  Patient voice his understanding.    PSE: Patient is alert,oriented, mood and affect is appropriate and speech is comprehensible.Denies HI/SI and psychosis  Assessment: Schizoaffective disorder MRE is unknown    Face to face follow up  consult and discussed with Dr. Lucianne Muss Plan:  Continue current medication management Social service will work with his sister regarding finding shelter / group home placement Observe in ER and obtain further info from his family and also legal guardianship documents  May rescind IVC if placement is available and may need TB test.  Assunta Found, FNP-BC 07/09/2013 3:25 PM

## 2013-07-09 NOTE — Progress Notes (Signed)
CSW met with pt at bedside as patient requested. Patient stated that he is ready to go now. CSW and pt discussed that they were trying to look for a group home. Patient stated he didn't want to wait any longer and wanted to go to a shelter or his step moms house. CSW and pt discussed that patient would have to speak with the doctor before any decisions were made. Patient becoming agitated and stated I'm going to go home today or I'm going to go to jail cause I'm leaving here today. CSW and pt discussed that we would work on a plan for patient. Patient deescalated and went back to patient room.     CSW unable to find a group home at this time. Pt sister remains patient guardian however refusing to come and pick up patient. CSW attempted to reach patient guardian and left message at patient home with pt family member with this writers name and number without detials of the reason for call. CSW also called patient guardian cell number [phone], however patient guardian cell is not accepting any incoming calls at this time.   Catha Gosselin, LCSW [phone]  ED CSW

## 2013-07-10 MED ORDER — MENTHOL 3 MG MT LOZG
1.0000 | LOZENGE | OROMUCOSAL | Status: DC | PRN
  Administered 2013-07-12 – 2013-07-13 (×2): 3 mg via ORAL
  Filled 2013-07-10: qty 9

## 2013-07-10 NOTE — Consult Note (Signed)
  Psychiatric Specialty Exam: Physical Exam  ROS  Blood pressure 104/67, pulse 66, temperature 97.6 F (36.4 C), temperature source Oral, resp. rate 18, SpO2 99.00%.There is no weight on file to calculate BMI.  General Appearance: Casual  Eye Contact::  Good  Speech:  Clear and Coherent  Volume:  Normal  Mood:  Anxious  Affect:  Appropriate  Thought Process:  Coherent and Logical  Orientation:  Full (Time, Place, and Person)  Thought Content:  Negative  Suicidal Thoughts:  No  Homicidal Thoughts:  No  Memory:  Immediate;   Fair Recent;   Fair Remote;   Fair  Judgement:  Intact  Insight:  Shallow  Psychomotor Activity:  Normal  Concentration:  Good  Recall:  Fair  Akathisia:  Negative  Handed:  Right  AIMS (if indicated):     Assets:  Physical Health  Sleep:   good  [name] [name] wants to go home but he accepts that he will go to a group home.  He says he is hearing no voices.  The longer he stays the more agitated he tends to get.  The plan is to continue seeking group home placement.

## 2013-07-10 NOTE — ED Notes (Signed)
Patient denies SI, HI, AVH. Patient responding to internal stimuli. Patient restless, pacing unit. Patient is calm, cooperative, redirectable. Encouragement offered. Medications given as ordered. Patient safety maintained. Q 15 checks continue.

## 2013-07-10 NOTE — ED Notes (Signed)
Patient up to restroom via ambulatory with a steady gait. No acute distress noted.

## 2013-07-10 NOTE — Progress Notes (Addendum)
Dawn from [facility] came to assess patient for level II pasarr. Per Alvis Lemmings, they are hoping to have a decision on pasarr by Friday.   CSW left message with Joselyn Glassman to follow up on patient interview for family care home. [phone].   CSW spok ewith Myriam Jacobson Long at [employer] who is reviewing patient information.  CSW spoke with Idamae Lusher at [facility] who is having supervisor review patient information.   No other group homes have been identified with availability in [employer].   CSW discussed case with Chiropodist, who will follow up with Wyvonnia Lora this evening.   Pt is remaining calm and coopeartive however becoming anxious about leaving the hospital by Friday. Patient states I am discharging Friday, if I dont go to a group home I'm still leaving.   CSW spoke with Mrs. Turner at [facility]PS, [phone] who stated she will attempt to reach patient guardian to see if she will sign patient into the group home once one is identified. CSW has been unabel to reach patient guardian.   Catha Gosselin, LCSW [phone]  ED CSW .07/10/2013 1105am

## 2013-07-11 DIAGNOSIS — R454 Irritability and anger: Secondary | ICD-10-CM

## 2013-07-11 DIAGNOSIS — F200 Paranoid schizophrenia: Secondary | ICD-10-CM

## 2013-07-11 NOTE — ED Notes (Signed)
Unable to assign pt to this RN.  Documentation from this point as assigned RN Renetta ChalkLarry Williams.  IT dept notified.

## 2013-07-11 NOTE — ED Notes (Signed)
Patient singing loudly and dancing in his bed. Pt given snack and drink. No distress noted.

## 2013-07-11 NOTE — ED Notes (Signed)
Patient singing very loudly on unit in his room, disrupting others. Behaviors redirected, pt states he will "try to not sing so loud".

## 2013-07-11 NOTE — ED Notes (Addendum)
Pt becoming agitated and louder. Pt took shower, leaving all of his clothes and towels in the floor and flooding the bathroom. Housekeeping in to clean bathroom.

## 2013-07-11 NOTE — ED Notes (Signed)
Patient in shower; no distress noted.

## 2013-07-11 NOTE — ED Notes (Signed)
PPD negative, copy placed on chart.

## 2013-07-11 NOTE — Consult Note (Signed)
  Follow up Psychiatry Consult   Subjective:  Patient continues to want to go home.  Patient states that he is doing good.  Patient has been compliant and calm.  Patient continues to deny suicidal/homicidal ideation, psychosis, and paranoia.    Axis I: Schizoaffective Disorder Axis II: Deferred Axis III:  Past Medical History  Diagnosis Date  . Schizophrenia   . Hypertension   . Pneumonia     had aspiration pneumonia 5/14-on vent   Axis IV: housing problems, other psychosocial or environmental problems, problems related to social environment and problems with primary support group Axis V: 61-70 mild symptoms Psychiatric Specialty Exam: Physical Exam  ROS  Blood pressure 120/84, pulse 71, temperature 98.2 F (36.8 C), temperature source Oral, resp. rate 18, SpO2 100.00%.There is no weight on file to calculate BMI.  General Appearance: Casual  Eye Contact::  Good  Speech:  Clear and Coherent and Normal Rate  Volume:  Increased  Mood:  Anxious and Depressed  Affect:  Congruent  Thought Process:  Circumstantial  Orientation:  Full (Time, Place, and Person)  Thought Content:  Rumination  Suicidal Thoughts:  No  Homicidal Thoughts:  No  Memory:  Immediate;   Fair Recent;   Fair Remote;   Fair  Judgement:  Poor  Insight:  Lacking  Psychomotor Activity:  Normal  Concentration:  Fair  Recall:  Fair  Akathisia:  No  Handed:  Right  AIMS (if indicated):     Assets:  Communication Skills Desire for Improvement  Sleep:      Face to face follow up consult   Disposition:  Will continue with current treatment plan.  SW to continue looking for group home placement.  Continue to monitor for safety and stabilization until patient is discharged to group home  Rankin, Denice BorsShuvon, FNP-BC 07/11/2013 1:14 PM  I have personally seen the patient and agreed with the findings and involved in the treatment plan. Kathryne SharperSyed Tremain Rucinski, MD

## 2013-07-12 DIAGNOSIS — F200 Paranoid schizophrenia: Secondary | ICD-10-CM

## 2013-07-12 NOTE — Progress Notes (Addendum)
CSW met with [place] owner of [facility]. Mr. Virgia Land also met with pt to interview. Patient had met Mr. Virgia Land previously when touring with pt guardian/sister. Mr. Richardson Landry is speaking further with his staff, as well as speaking with [facility] in [place] to consider patient for the day program. Mr. Richardson Landry expressed that patient would have rules that he would need to follow. Those rules including,   1. Patient must use nicotine Patch, no smoking allowed.  2. No profanity in home.  3. No arguing with other clients.  4. Bed time 930pm  5. Must go to day program.    Mr. Virgia Land spoke with pt guardian/sister Lucile Crater by phone. Pt guardian/sister willing to sign patient in on Saturday once she returns to town. Mr. Virgia Land waiting to hear from day program before making final decision.   Pt may be able to discharge tomorrow to group home. These rules would need to be gone over with patient by CSW and group home and to make sure patient understands.   Dorathy Kinsman, LCSW [phone]  ED CSW .07/12/2013 1343pm   CSW attempted to follow up with pt guardian regarding care coordinator, and left message.

## 2013-07-12 NOTE — Progress Notes (Signed)
CSW received call from Suzanna Obeyobin, [place] care cooridnator assigned to patient. She can be reached at [phone]. CSW will return care cooridnators call with update.   Catha Gosselin.Nivek Powley Stewart, LCSW [phone]  ED CSW .07/12/2013 1139am

## 2013-07-12 NOTE — Progress Notes (Signed)
CSW spoke with guardian, Wendelyn BreslowKendra Furniss at [phone] regarding patient disposition. Pt guardian agreeable for patient to go to group hoem and is willing to sign patient in. Ms. Katrinka BlazingSmith, stated that she would not be able to sign patient in today, however could sign patient in tomorrow.   CSW spoke with ChiropodistAssistant Director who spoke with Wyvonnia LoraKelly Bennett last night, who stated he would come to see patient today in the ED. CSW left message with Joselyn GlassmanKelly Bennet at [phone] regarding patient.   CSW obtained patient pasarr number.   CSW received message from [facility], Fatima BlankMary Fox who has offered her group home as well. Pt guardian [place] group home with [facility] first because patient would not be able to wander around as much due to not knowing the area as well.   CSW awaiting call back from MotorolaKelly Bennett.   Catha Gosselin.Mirren Gest Stewart, LCSW [phone]  ED CSW .07/12/2013 908am

## 2013-07-12 NOTE — ED Notes (Signed)
Patient denies SI, HI, AVH. Patient observed pacing the unit, responding to internal stimuli.  Encouragement offered. Patient is redirectable. Patient safety maintained, Q 15 checks continue.

## 2013-07-12 NOTE — Progress Notes (Signed)
Patient ID: [name] [name], male   DOB: 1971/01/07, [age] y.o.   MRN: [id number] Psychiatric Specialty Exam: Physical Exam  ROS  Blood pressure 105/72, pulse 72, temperature 98.1 F (36.7 C), temperature source Oral, resp. rate 18, SpO2 97.00%.There is no weight on file to calculate BMI.  General Appearance: Casual  Eye Contact::  Fair  Speech:  Clear and Coherent and Normal Rate  Volume:  Normal  Mood:  Depressed  Affect:  Congruent and Constricted  Thought Process:  Linear  Orientation:  Full (Time, Place, and Person)  Thought Content:  NA  Suicidal Thoughts:  No  Homicidal Thoughts:  No  Memory:  Immediate;   Fair Recent;   Fair Remote;   Fair  Judgement:  Poor  Insight:  Present  Psychomotor Activity:  Normal  Concentration:  Fair  Recall:  NA  Akathisia:  NA  Handed:  Right  AIMS (if indicated):     Assets:  Desire for Improvement Housing  Sleep:      Seen on rounds with Dr [name] [name] this am.  Patient is waiting for group home placement.  He is calm and cooperative.  We have not have any behavior issues with patient.  He denies SI/HI/AVH.  [name] [name]    PMHNP-BC I agreed with the findings, treatment and disposition plan of this patient. [name] RossNadeem Tulip Meharg, MD

## 2013-07-13 NOTE — Progress Notes (Signed)
Patient ID: [name] [name], male   DOB: [birth year], 43 y.o.   MRN: [id number] Psychiatric Specialty Exam: Physical Exam  ROS  Blood pressure 106/71, pulse 88, temperature 98.5 F (36.9 C), temperature source Oral, resp. rate 20, SpO2 94.00%.There is no weight on file to calculate BMI.  General Appearance: Casual and Fairly Groomed  Eye Contact::  Good  Speech:  Clear and Coherent and Normal Rate  Volume:  Normal  Mood:  Anxious, Depressed and Hopeless  Affect:  Congruent, Depressed and Flat  Thought Process:  Coherent and Goal Directed  Orientation:  Full (Time, Place, and Person)  Thought Content:  NA  Suicidal Thoughts:  No  Homicidal Thoughts:  No  Memory:  Immediate;   Good Recent;   Good Remote;   Fair  Judgement:  Fair  Insight:  Present  Psychomotor Activity:  Normal  Concentration:  NA  Recall:  NA  Akathisia:  NA  Handed:  Right  AIMS (if indicated):     Assets:  Desire for Improvement  Sleep:      Patient is disappointed that he was rejected by the group home that came to see him.  He want staff to look for a new group home for him.  Our social worker is looking for group home for patient.  We will continue to offer him his medications while we look for another group home.  He denies SI/HI/AVH.  Dahlia ByesJosephine Onuoha   PMHNP-BC  Patient was seen face-to-face evaluation with a physician extender and reviewed the information documented and agree with the treatment plan.  Torrian Canion,[name] 07/13/2013 12:12 PM

## 2013-07-13 NOTE — ED Notes (Signed)
Pt has slept most of the morning and early afternoon c/o having difficulty sleeping prior to coming to hospital. Pt reports his hallucinations have decrease however, he has been seen talking to self. Appetite is good, denies S/I. Pt states he's going back to his old group home tomorrow that he just needed to rest. Compliant with medications. Explained effects of medications on him and potential side effects

## 2013-07-13 NOTE — ED Notes (Signed)
Patient presents calm and cooperative, denies any current pain, denies SI,HI; states that his AVH "is fine" at this time does not appear to be responding, NAD.

## 2013-07-13 NOTE — Progress Notes (Signed)
CSW spoke with Wyvonnia LoraKelly Bennett of [facility]. [facility] unable to accept patient.   CSW left message for pt's guardian/sister to inform her and discuss [facility] placement instead. Awaiting return call.   CSW left message for Fatima BlankMary Alsace Dowd at [facility]. Awaiting return call.  York Spaniellexandra Emmalee Solivan HuckabayLCSWA, [phone]     ED CSW  10:30am

## 2013-07-13 NOTE — ED Notes (Signed)
Patient rested well throughout the night. Respirations even and unlabored.  Patient safety maintained. Q 15 checks continue.

## 2013-07-13 NOTE — ED Notes (Signed)
  Pt sleeping on rounds.resp regular unlabored

## 2013-07-14 ENCOUNTER — Encounter (HOSPITAL_COMMUNITY): Payer: Self-pay | Admitting: Registered Nurse

## 2013-07-14 NOTE — Consult Note (Signed)
   Follow up Psychiatry Consult   Subjective:   Patient states that he is ready to leave.  "The group home didn't work out. I'm ready to leave.  You doctors holding me here against my will. [facility] if you give me a bus pass I can go there. My sister is not my guardian no more.  Just give me my medicine and let me leave.  If you don't let me leave I'm going to bust the damn window; you can't hold me here.  I'm tired of you mother fuckers holding me here; I'm ready to leave.  Patient was able to name off all of his medications that he needed. Patient has become frustrated about being here in the psych ED for 2 weeks.  Patient is starting to get agitated feeling that he is being held against his will.  Patient denies psychosis and suicidal/homicidal ideation.  Patient sister is his guardian but refuses to come pick up patient.  SW has contacted Adult protective services but no actions has been made for the well being of this patient and his discharge.  Patient continues to get up set and wanting to leave the hospital.  At this point patient is here because he has no where to go.  SW and Adult Protective Services has states that patient can not be discharge to a homeless shelter because of competence.  Patient has been medically and psychologically cleared for discharge.   Axis I: Schizoaffective Disorder Axis II: Deferred Axis III:  Past Medical History  Diagnosis Date  . Schizophrenia   . Hypertension   . Pneumonia     had aspiration pneumonia 5/14-on vent   Axis IV: housing problems, other psychosocial or environmental problems, problems related to social environment and problems with primary support group Axis V: 61-70 mild symptoms Psychiatric Specialty Exam: Physical Exam  ROS  Blood pressure 108/75, pulse 83, temperature 97.4 F (36.3 C), temperature source Oral, resp. rate 20, SpO2 95.00%.There is no weight on file to calculate BMI.  General Appearance:  Casual  Eye Contact::  Good  Speech:  Clear and Coherent and Normal Rate  Volume:  Increased  Mood:  Angry, Anxious, Depressed and Irritable  Affect:  Blunt and Congruent  Thought Process:  Circumstantial  Orientation:  Full (Time, Place, and Person)  Thought Content:  Rumination  Suicidal Thoughts:  No  Homicidal Thoughts:  No  Memory:  Immediate;   Good Recent;   Good Remote;   Fair  Judgement:  Poor  Insight:  Lacking  Psychomotor Activity:  Normal  Concentration:  Fair  Recall:  Fair  Akathisia:  No  Handed:  Right  AIMS (if indicated):     Assets:  Communication Skills Desire for Improvement  Sleep:      Face to face follow up consult with Dr. Elsie SaasJonnalagadda  Disposition:  Will continue with current treatment plan.  SW to continue looking for group home placement. SW to also contact Manager/Supervisor at DSS who can push Adult Protective services to take action on assisting to find patient a place to live.  Continue to monitor for safety and stabilization until patient is discharged to group home  Rankin, Denice BorsShuvon, FNP-BC 07/14/2013 11:34 AM  Patient is seen face to face for this evaluation with physician extender and reviewed the information documented by Physician extender and agree with the disposition plan.  Inga Noller,[name] 07/15/2013 9:04 AM

## 2013-07-14 NOTE — Accreditation Note (Signed)
[name] [name] with SW left another message with APS and is awaiting a follow up call  Pt is upset he is still here and wants to leave.  Director(s) of SW, [facility], [facility] and Psych ED and APS need to come together and have a strategy plan and evaluate plan daily to successfully discharge pt from ED.  It is understanding there are limited options however pt has been in ED for "holding purposes" due to housing.  This is not an appropriate use of the ED services.  Pt is IVC and under guardianship of his sister but she won't return calls and is not involved while pt is in ED.    Again, Directors need to seize control of this situation and bring resolution to the situation.  Dr. Elsie SaasJonnalagadda will also note need for Directors to get involved.

## 2013-07-14 NOTE — Progress Notes (Signed)
CSW spoke with [name] [name] [phone]) at [facility]. [name] [name] will send out group home member to meet with [name] this afternoon. CSW has sent along FL2 and psych assessment.  CSW has discussed case with DSS. DSS paged on-call APS worker. APS worker looking into status of APS complaint on pt's guardian. CSW to follow.  York Spaniellexandra Carlissa Pesola [place]CSWA, [phone]     ED CSW  1:07pm

## 2013-07-14 NOTE — Progress Notes (Addendum)
CSW met with Eduard Roux, manager of [facility] on [address].   Jenkins met with pt and accepted him to [facility]. Arnoldo Morale will pick pt up tomorrow afternoon, around 1pm. Arnoldo Morale states that she and her boss, Marga Hoots, are in contact with patients guardian and will arrange sign in with her. Arnoldo Morale needs 1-2 weeks of meds in hand, TB test, updated FL2, as well as some information from [place]. CSW will work on [employer], vitals, and Restaurant manager, fast food.   [place], [place]     ED CSW  2:46pm

## 2013-07-15 DIAGNOSIS — F259 Schizoaffective disorder, unspecified: Secondary | ICD-10-CM

## 2013-07-15 MED ORDER — DIVALPROEX SODIUM 500 MG PO DR TAB: 500.0000 mg | DELAYED_RELEASE_TABLET | Freq: Two times a day (BID) | ORAL | Status: DC

## 2013-07-15 MED ORDER — HALOPERIDOL DECANOATE 100 MG/ML IM SOLN: 100.0000 mg | INTRAMUSCULAR | Status: DC

## 2013-07-15 MED ORDER — BENZTROPINE MESYLATE 1 MG PO TABS: 1.0000 mg | ORAL_TABLET | Freq: Two times a day (BID) | ORAL | Status: DC

## 2013-07-15 MED ORDER — HALOPERIDOL 10 MG PO TABS: 10.0000 mg | ORAL_TABLET | Freq: Two times a day (BID) | ORAL | Status: DC

## 2013-07-15 MED ORDER — LOXAPINE SUCCINATE 25 MG PO CAPS: 25.0000 mg | ORAL_CAPSULE | Freq: Four times a day (QID) | ORAL | Status: DC

## 2013-07-15 MED ORDER — CLONAZEPAM 2 MG PO TABS: 2.0000 mg | ORAL_TABLET | Freq: Two times a day (BID) | ORAL | Status: DC | PRN

## 2013-07-15 MED ORDER — LISINOPRIL 10 MG PO TABS: 10.0000 mg | ORAL_TABLET | Freq: Every day | ORAL | Status: DC

## 2013-07-15 NOTE — [facility] Suicide Risk Assessment (Signed)
Suicide Risk Assessment  Discharge Assessment     Demographic Factors:  Low socioeconomic status, Unemployed and [place] male  Mental Status Per Nursing Assessment::   On Admission:   unknown  Current Mental Status by Physician: NA  Loss Factors: NA  Historical Factors: NA  Risk Reduction Factors:   Positive social support and Positive therapeutic relationship  Continued Clinical Symptoms:  Schizophrenia:   Command hallucinatons Depressive state Paranoid or undifferentiated type  Cognitive Features That Contribute To Risk:  Closed-mindedness Loss of executive function Polarized thinking Thought constriction (tunnel vision)    Suicide Risk:  Minimal: No identifiable suicidal ideation.  Patients presenting with no risk factors but with morbid ruminations; may be classified as minimal risk based on the severity of the depressive symptoms  Discharge Diagnoses:    AXIS I:  Schizoaffective Disorder AXIS II:  Deferred AXIS III:   Past Medical History  Diagnosis Date  . Schizophrenia   . Hypertension   . Pneumonia     had aspiration pneumonia 5/14-on vent   AXIS IV:  economic problems, educational problems, housing problems, occupational problems, other psychosocial or environmental problems, problems related to legal system/crime, problems related to social environment, problems with access to health care services and problems with primary support group AXIS V:  51-60 moderate symptoms  Plan Of Care/Follow-up recommendations:  Activity:  as tolerated  Is patient on multiple antipsychotic therapies at discharge:  Yes,   Do you recommend tapering to monotherapy for antipsychotics?  No   Has Patient had three or more failed trials of antipsychotic monotherapy by history:  No  Recommended Plan for Multiple Antipsychotic Therapies: Additional reason(s) for multiple antispychotic treatment:  non compliance; on a haldol decanoate  [name] FriesBLANKMANN, [name] [name] 07/15/2013,  12:47 PM

## 2013-07-15 NOTE — Progress Notes (Addendum)
   CARE MANAGEMENT ED NOTE 07/15/2013  Patient:  [name] [name]   Account Number:  [id number]  Date Initiated:  07/15/2013  Documentation initiated by:  Edd ArbourGIBBS,Alanzo Lamb  Subjective/Objective Assessment:   43 yr old male with [facility] insurance accepted to a Group home scheduled for pick up today TTS/ED SW inquired about medication assistance The group home requesting 1-2 weeks of medications for pt     Subjective/Objective Assessment Detail:   This pt is not a CHS MATCH candidate related the pt having a confirmed insurance carrier with medication coverage plan [employer] care medicare -evercare (this pt is not a self pay/uninsured  pt)     Action/Plan:   CM spoke with [facility] TTS & ED SW to inform them this pt is not a CHS MATCH candidate CM Recommended the pt's group home staff be assisted with prescriptions for 1-2 weeks of medications & [facility]   Action/Plan Detail:   Anticipated DC Date:  07/15/2013     Status Recommendation to Physician:   Result of Recommendation:    Other ED Services  Consult Working Plan    DC Planning Services  Other  Medication Assistance  Outpatient Services - Pt will follow up    Choice offered to / List presented to:            Status of service:  Completed, signed off  ED Comments:   ED Comments Detail:   07/15/13 1253 Reviewed with CM leadership staff Recommendation for Rx to be sent to pharmacy for possible processing (like [facility] pts) ED SW updated and states Dr Lucianne MussKumar has assisted

## 2013-07-15 NOTE — Progress Notes (Signed)
CSW [facility] with [facility] coordinator, Zella BallRobin [phone]). Zella BallRobin is aware of pt's disposition plan for today. Zella BallRobin stated she will follow pt post discharge to connect with additional services.  York Spaniellexandra Livia Tarr EmeraldLCSWA, [phone]     ED CSW  9:36am ___________  CSW spoke with MD Lucianne MussKumar and [facility]lena [facility] to get 2 weeks of rx in hand, per gh request. Med requests must [facility] by Psych MD or extender. CSW made MD Lucianne MussKumar and her NP aware.  York Spaniellexandra Nema Oatley [place]CSWA, [phone]     ED CSW  11:00am

## 2013-07-15 NOTE — Progress Notes (Addendum)
CSW [facility] with [facility], Zella BallRobin [phone]). Zella BallRobin is aware of pt's disposition plan for today. Zella BallRobin stated she will follow pt post discharge to connect with additional services.  York Spaniellexandra Roddrick Sharron PachutaLCSWA, [phone]     ED CSW  9:36am ___________  CSW spoke with MD Lucianne MussKumar [facility], per gh request. Med requests must be put into pharmacy by MD or extender.  York Spaniellexandra Sherwin Hollingshed LansfordLCSWA, [phone]     ED CSW  11:00am   ________  Constance HolsterGuardian/sister contacted CSW asking for pt's care coordinator contact information. CSW provided [facility]. Guardian/sister states she has been working with [facility] and will be able to sign him in today.  [place] Spaniellexandra Handy Mcloud HammettLCSWA, [phone]     ED CSW  1:00pm __________  CSW received call from Ms. Lovell SheehanJenkins, manager of group home. Lovell SheehanJenkins stated she would be here within the half hour. CSW spoke with RN--meds, scripts, FL2, and pt's care coordinator information are in pt's d/c packet.  York Spaniellexandra Daci Stubbe TarkioLCSWA, [phone]     ED CSW  1:18pm

## 2013-07-16 NOTE — Consult Note (Signed)
Case discussed, and agree with plan 

## 2013-07-28 ENCOUNTER — Encounter (HOSPITAL_COMMUNITY): Payer: Self-pay | Admitting: Emergency Medicine

## 2013-07-28 ENCOUNTER — Emergency Department (HOSPITAL_COMMUNITY)
Admission: EM | Admit: 2013-07-28 | Discharge: 2013-07-28 | Disposition: A | Discharge status: Home / Self Care | Payer: PRIVATE HEALTH INSURANCE | Attending: Emergency Medicine | Admitting: Emergency Medicine

## 2013-07-28 DIAGNOSIS — F17200 Nicotine dependence, unspecified, uncomplicated: Secondary | ICD-10-CM | POA: Insufficient documentation

## 2013-07-28 DIAGNOSIS — Z8701 Personal history of pneumonia (recurrent): Secondary | ICD-10-CM | POA: Insufficient documentation

## 2013-07-28 DIAGNOSIS — F209 Schizophrenia, unspecified: Secondary | ICD-10-CM | POA: Insufficient documentation

## 2013-07-28 DIAGNOSIS — Z79899 Other long term (current) drug therapy: Secondary | ICD-10-CM | POA: Insufficient documentation

## 2013-07-28 DIAGNOSIS — R440 Auditory hallucinations: Secondary | ICD-10-CM

## 2013-07-28 DIAGNOSIS — I10 Essential (primary) hypertension: Secondary | ICD-10-CM | POA: Insufficient documentation

## 2013-07-28 DIAGNOSIS — R443 Hallucinations, unspecified: Secondary | ICD-10-CM | POA: Insufficient documentation

## 2013-07-28 LAB — URINALYSIS, ROUTINE W REFLEX MICROSCOPIC
Bilirubin Urine: NEGATIVE
GLUCOSE, UA: NEGATIVE mg/dL
Ketones, ur: NEGATIVE mg/dL
Leukocytes, UA: NEGATIVE
NITRITE: NEGATIVE
Protein, ur: NEGATIVE mg/dL
SPECIFIC GRAVITY, URINE: 1.006 (ref 1.005–1.030)
Urobilinogen, UA: 0.2 mg/dL (ref 0.0–1.0)
pH: 6 (ref 5.0–8.0)

## 2013-07-28 LAB — RAPID URINE DRUG SCREEN, HOSP PERFORMED
AMPHETAMINES: NOT DETECTED
BENZODIAZEPINES: NOT DETECTED
Barbiturates: NOT DETECTED
Cocaine: NOT DETECTED
OPIATES: NOT DETECTED
Tetrahydrocannabinol: NOT DETECTED

## 2013-07-28 LAB — URINE MICROSCOPIC-ADD ON

## 2013-07-28 NOTE — ED Provider Notes (Signed)
CSN: [id number]     Arrival date & time 07/28/13  2148 History  This chart was scribed for non-physician practitioner Earley FavorGail Brendia Dampier, NP working with Nelia Shiobert L Beaton, MD by Danella Maiersaroline Early, ED Scribe. This patient was seen in room WTR4/WLPT4 and the patient's care was started at 9:55 PM.    Chief Complaint  Patient presents with  . Hallucinations   The history is provided by the patient. No language interpreter was used.   HPI Comments: [name] [name] is a 43 y.o. male with a h/o schizophrenia who presents to the Emergency Department complaining of auditory hallucinations onset 2 weeks ago. Pt reports he has been hearing voices telling him he cannot be still. Pt reports he was off his schizophrenia meds for a while because he was in jail. He had seroquil tonight at the group home. He gets Haldol injections every 3-4 weeks. He states the last time he had a shot was 2 weeks ago. He states he left the group home tonight because "they don't feed me enough".    Past Medical History  Diagnosis Date  . Schizophrenia   . Hypertension   . Pneumonia     had aspiration pneumonia 5/14-on vent   Past Surgical History  Procedure Laterality Date  . Arm surgery      hurt his arm 25 hr ago  . Open reduction internal fixation (orif) proximal phalanx Right 04/02/2013    Procedure: OPEN REDUCTION INTERNAL FIXATION (ORIF) RIGHT SMALL FINGER;  Surgeon: Tami RibasKevin R Kuzma, MD;  Location: [facility];  Service: Orthopedics;  Laterality: Right;   History reviewed. No pertinent family history. History  Substance Use Topics  . Smoking status: Current Every Day Smoker -- 0.25 packs/day    Types: Cigarettes  . Smokeless tobacco: Not on file  . Alcohol Use: No     Comment: not now    Review of Systems  Psychiatric/Behavioral: Positive for hallucinations. Negative for suicidal ideas, behavioral problems and sleep disturbance. The patient is not nervous/anxious and is not hyperactive.   All other systems  reviewed and are negative.    Allergies  Review of patient's allergies indicates no known allergies.  Home Medications   Current Outpatient Rx  Name  Route  Sig  Dispense  Refill  . benztropine (COGENTIN) 1 MG tablet   Oral   Take 1 tablet (1 mg total) by mouth 2 (two) times daily.   60 tablet   0   . clonazePAM (KLONOPIN) 2 MG tablet   Oral   Take 1 tablet (2 mg total) by mouth 2 (two) times daily as needed (anxiety).   30 tablet   0   . divalproex (DEPAKOTE) 500 MG DR tablet   Oral   Take 1 tablet (500 mg total) by mouth 2 (two) times daily.   60 tablet   0   . haloperidol (HALDOL) 10 MG tablet   Oral   Take 1 tablet (10 mg total) by mouth 2 (two) times daily.   60 tablet   0   . haloperidol decanoate (HALDOL DECANOATE) 100 MG/ML injection   Intramuscular   Inject 1 mL (100 mg total) into the muscle every 30 (thirty) days.   1 mL   1     Last given on 07/05/2013   . lisinopril (PRINIVIL,ZESTRIL) 10 MG tablet   Oral   Take 1 tablet (10 mg total) by mouth daily.   30 tablet   0   . loxapine (LOXITANE) 25 MG  capsule   Oral   Take 1 capsule (25 mg total) by mouth 4 (four) times daily.   120 capsule   0    BP 143/91  Pulse 86  Temp(Src) 98.7 F (37.1 C) (Oral)  Resp 20  SpO2 96% Physical Exam  Nursing note and vitals reviewed. Constitutional: He is oriented to person, place, and time. He appears well-developed and well-nourished. No distress.  HENT:  Head: Normocephalic and atraumatic.  Eyes: EOM are normal. Pupils are equal, round, and reactive to light.  Neck: Normal range of motion.  Cardiovascular: Normal rate.   Pulmonary/Chest: Effort normal. No respiratory distress.  Musculoskeletal: Normal range of motion.  Neurological: He is alert and oriented to person, place, and time.  Skin: Skin is warm and dry.  Psychiatric: He has a normal mood and affect. He is actively hallucinating. Thought content is not delusional. He expresses inappropriate  judgment. He expresses no suicidal plans and no homicidal plans.    ED Course  Procedures (including critical care time) Medications - No data to display  DIAGNOSTIC STUDIES: Oxygen Saturation is 96% on RA, normal by my interpretation.    COORDINATION OF CARE: 10:01 PM- Discussed treatment plan with pt. Pt agrees to plan.    Labs Review Labs Reviewed  URINE RAPID DRUG SCREEN (HOSP PERFORMED)  URINALYSIS, ROUTINE W REFLEX MICROSCOPIC  ACETAMINOPHEN LEVEL  ETHANOL  CBC WITH DIFFERENTIAL  COMPREHENSIVE METABOLIC PANEL   Imaging Review No results found.  EKG Interpretation   None       MDM   1. Auditory hallucinations     Patient, states he's changed his mind about being evaluated.  He has a bus pass and he will return to his group home.  He again denies suicidality or homicidality   I personally performed the services described in this documentation, which was scribed in my presence. The recorded information has been reviewed and is accurate.  Arman Filter, NP 07/28/13 2227

## 2013-07-28 NOTE — Discharge Instructions (Signed)
Hallucinations and Delusions You seem to be having hallucinations and/or delusions. You may be hearing voices that no one else can hear. This can seem very real to you. You may be having thoughts and fears that do not make sense to others. This condition can be due to mental disease like schizophrenia. It may be caused by a medical condition, such as an infection or electrolyte disturbance. These symptoms are also seen in drug abusers, especially those who use crack cocaine and amphetamines. Drugs like PCP, LSD, MDMA, peyote, and psilocybin can also cause frightening hallucinations and loss of control. If your symptoms are due to drug abuse, your mental state should improve as the drug(s) leave your system. Someone you trust should be with you until you are better to protect you and calm your fears. Often tranquilizers are very helpful at controlling hallucinations, anxiety, and destructive behavior. Getting a proper diet and enough sleep is important to recovery. If your symptoms are not due to drugs, or do not improve over several days after stopping drug use, you need further medical or mental health care. SEEK IMMEDIATE MEDICAL CARE IF:   Your symptoms get worse, especially if you think your life is in danger  You have violent or destructive thoughts. Recovery is possible, but you have to get proper treatment and avoid drugs that are known to cause you trouble. Document Released: 08/04/2004 Document Revised: 09/19/2011 Document Reviewed: 06/27/2005 [facility] Patient Information 2014 [employer] [place]. Please return directly to your group home.  Followup with mental health per your norm for your Haldol injections

## 2013-07-28 NOTE — ED Notes (Signed)
Patient is alert and oriented x3.  He is complaining of auditory hallucinations that started two weeks ago. Patient called EMS tonight for an additional complaint of headache.

## 2013-07-31 NOTE — ED Provider Notes (Signed)
Medical screening examination/treatment/procedure(s) were performed by non-physician practitioner and as supervising physician I was immediately available for consultation/collaboration.   Colman Birdwell L Zykerria Tanton, MD 07/31/13 2136 

## 2013-08-29 ENCOUNTER — Encounter (HOSPITAL_COMMUNITY): Payer: Self-pay | Admitting: Emergency Medicine

## 2013-08-29 ENCOUNTER — Emergency Department (HOSPITAL_COMMUNITY)
Admission: EM | Admit: 2013-08-29 | Discharge: 2013-08-30 | Disposition: A | Discharge status: Home / Self Care | Payer: PRIVATE HEALTH INSURANCE | Attending: Emergency Medicine | Admitting: Emergency Medicine

## 2013-08-29 DIAGNOSIS — Z8701 Personal history of pneumonia (recurrent): Secondary | ICD-10-CM | POA: Insufficient documentation

## 2013-08-29 DIAGNOSIS — Z79899 Other long term (current) drug therapy: Secondary | ICD-10-CM | POA: Insufficient documentation

## 2013-08-29 DIAGNOSIS — Z760 Encounter for issue of repeat prescription: Secondary | ICD-10-CM | POA: Insufficient documentation

## 2013-08-29 DIAGNOSIS — I10 Essential (primary) hypertension: Secondary | ICD-10-CM | POA: Insufficient documentation

## 2013-08-29 DIAGNOSIS — R443 Hallucinations, unspecified: Secondary | ICD-10-CM | POA: Insufficient documentation

## 2013-08-29 DIAGNOSIS — F17200 Nicotine dependence, unspecified, uncomplicated: Secondary | ICD-10-CM | POA: Insufficient documentation

## 2013-08-29 DIAGNOSIS — F209 Schizophrenia, unspecified: Secondary | ICD-10-CM | POA: Insufficient documentation

## 2013-08-29 LAB — RAPID URINE DRUG SCREEN, HOSP PERFORMED
Amphetamines: NOT DETECTED
BARBITURATES: NOT DETECTED
Benzodiazepines: NOT DETECTED
Cocaine: NOT DETECTED
Opiates: NOT DETECTED
TETRAHYDROCANNABINOL: NOT DETECTED

## 2013-08-29 LAB — COMPREHENSIVE METABOLIC PANEL
ALK PHOS: 67 U/L (ref 39–117)
ALT: 29 U/L (ref 0–53)
AST: 27 U/L (ref 0–37)
Albumin: 4 g/dL (ref 3.5–5.2)
BUN: 4 mg/dL — AB (ref 6–23)
CO2: 28 mEq/L (ref 19–32)
Calcium: 9.9 mg/dL (ref 8.4–10.5)
Chloride: 96 mEq/L (ref 96–112)
Creatinine, Ser: 1.03 mg/dL (ref 0.50–1.35)
GFR calc Af Amer: >90 mL/min (ref >90)
GFR, EST NON AFRICAN AMERICAN: 89 mL/min — AB (ref >90)
GLUCOSE: 82 mg/dL (ref 70–99)
POTASSIUM: 4.5 meq/L (ref 3.7–5.3)
SODIUM: 137 meq/L (ref 137–147)
TOTAL PROTEIN: 7.8 g/dL (ref 6.0–8.3)
Total Bilirubin: 0.3 mg/dL (ref 0.3–1.2)

## 2013-08-29 LAB — CBC
HEMATOCRIT: 46.9 % (ref 39.0–52.0)
Hemoglobin: 15.7 g/dL (ref 13.0–17.0)
MCH: 29.1 pg (ref 26.0–34.0)
MCHC: 33.5 g/dL (ref 30.0–36.0)
MCV: 87 fL (ref 78.0–100.0)
Platelets: 130 10*3/uL — ABNORMAL LOW (ref 150–400)
RBC: 5.39 MIL/uL (ref 4.22–5.81)
RDW: 13.8 % (ref 11.5–15.5)
WBC: 8.9 10*3/uL (ref 4.0–10.5)

## 2013-08-29 LAB — SALICYLATE LEVEL: Salicylate Lvl: <2 mg/dL — ABNORMAL LOW (ref 2.8–20.0)

## 2013-08-29 LAB — ACETAMINOPHEN LEVEL: Acetaminophen (Tylenol), Serum: <15 ug/mL (ref 10–30)

## 2013-08-29 LAB — ETHANOL: Alcohol, Ethyl (B): <11 mg/dL (ref 0–11)

## 2013-08-29 MED ORDER — OLANZAPINE 10 MG PO TBDP: 10.0000 mg | ORAL_TABLET | Freq: Three times a day (TID) | ORAL | Status: DC | PRN

## 2013-08-29 MED ORDER — NICOTINE 21 MG/24HR TD PT24
21.0000 mg | MEDICATED_PATCH | Freq: Every day | TRANSDERMAL | Status: DC
  Filled 2013-08-29: qty 1

## 2013-08-29 MED ORDER — OLANZAPINE 5 MG PO TBDP
5.0000 mg | ORAL_TABLET | Freq: Three times a day (TID) | ORAL | Status: DC | PRN
  Administered 2013-08-30: 5 mg via ORAL
  Filled 2013-08-29: qty 1

## 2013-08-29 MED ORDER — LORAZEPAM 1 MG PO TABS: 1.0000 mg | ORAL_TABLET | ORAL | Status: DC | PRN

## 2013-08-29 MED ORDER — ONDANSETRON HCL 4 MG PO TABS: 4.0000 mg | ORAL_TABLET | Freq: Three times a day (TID) | ORAL | Status: DC | PRN

## 2013-08-29 MED ORDER — LOXAPINE SUCCINATE 25 MG PO CAPS
25.0000 mg | ORAL_CAPSULE | Freq: Four times a day (QID) | ORAL | Status: DC
  Administered 2013-08-29 – 2013-08-30 (×3): 25 mg via ORAL
  Filled 2013-08-29 (×5): qty 1

## 2013-08-29 MED ORDER — ZIPRASIDONE MESYLATE 20 MG IM SOLR: 20.0000 mg | INTRAMUSCULAR | Status: DC | PRN

## 2013-08-29 MED ORDER — LORAZEPAM 1 MG PO TABS
1.0000 mg | ORAL_TABLET | Freq: Three times a day (TID) | ORAL | Status: DC | PRN
  Administered 2013-08-29: 1 mg via ORAL
  Filled 2013-08-29: qty 1

## 2013-08-29 MED ORDER — LISINOPRIL 10 MG PO TABS
10.0000 mg | ORAL_TABLET | Freq: Every day | ORAL | Status: DC
  Administered 2013-08-30: 10 mg via ORAL
  Filled 2013-08-29: qty 1

## 2013-08-29 MED ORDER — IBUPROFEN 200 MG PO TABS
600.0000 mg | ORAL_TABLET | Freq: Three times a day (TID) | ORAL | Status: DC | PRN
Start: 2013-08-29 — End: 2013-08-30

## 2013-08-29 MED ORDER — BENZTROPINE MESYLATE 1 MG PO TABS
1.0000 mg | ORAL_TABLET | Freq: Two times a day (BID) | ORAL | Status: DC
  Administered 2013-08-29 – 2013-08-30 (×2): 1 mg via ORAL
  Filled 2013-08-29 (×2): qty 1

## 2013-08-29 MED ORDER — ZIPRASIDONE MESYLATE 20 MG IM SOLR
20.0000 mg | INTRAMUSCULAR | Status: AC | PRN
  Administered 2013-08-30: 20 mg via INTRAMUSCULAR
  Filled 2013-08-29: qty 20

## 2013-08-29 MED ORDER — LORAZEPAM 1 MG PO TABS
1.0000 mg | ORAL_TABLET | ORAL | Status: AC | PRN
  Administered 2013-08-30: 1 mg via ORAL
  Filled 2013-08-29: qty 1

## 2013-08-29 MED ORDER — ACETAMINOPHEN 325 MG PO TABS: 650.0000 mg | ORAL_TABLET | ORAL | Status: DC | PRN

## 2013-08-29 MED ORDER — HALOPERIDOL 5 MG PO TABS
10.0000 mg | ORAL_TABLET | Freq: Two times a day (BID) | ORAL | Status: DC
  Administered 2013-08-29 – 2013-08-30 (×2): 10 mg via ORAL
  Filled 2013-08-29 (×2): qty 2

## 2013-08-29 NOTE — ED Notes (Signed)
Patient walked outside, states he is going to smoke and will come back.
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
## 2013-08-29 NOTE — ED Provider Notes (Signed)
CSN: [id number]     Arrival date & time 08/29/13  1815 History   First MD Initiated Contact with Patient 08/29/13 1818     Chief Complaint  Patient presents with  . Hallucinations     (Consider location/radiation/quality/duration/timing/severity/associated sxs/prior Treatment) HPI Comments: 43 yo male with hx of schizophrenia presenting with auditory hallucinations.  They are commanding him to yell out and shout.    Patient is a 43 y.o. male presenting with mental health disorder.  Mental Health Problem Presenting symptoms: hallucinations   Onset quality:  Sudden Duration:  1 day Timing:  Constant Progression:  Unchanged Chronicity:  Recurrent Context: not alcohol use, not drug abuse, not noncompliant and not recent medication change   Relieved by:  Nothing Worsened by:  Nothing tried Associated symptoms: no abdominal pain, no anhedonia and no chest pain   Associated symptoms comment:  No SI and no HI   Past Medical History  Diagnosis Date  . Schizophrenia   . Hypertension   . Pneumonia     had aspiration pneumonia 5/14-on vent   Past Surgical History  Procedure Laterality Date  . Arm surgery      hurt his arm 25 hr ago  . Open reduction internal fixation (orif) proximal phalanx Right 04/02/2013    Procedure: OPEN REDUCTION INTERNAL FIXATION (ORIF) RIGHT SMALL FINGER;  Surgeon: Tami RibasKevin R Kuzma, MD;  Location: [facility];  Service: Orthopedics;  Laterality: Right;   No family history on file. History  Substance Use Topics  . Smoking status: Current Every Day Smoker -- 0.25 packs/day    Types: Cigarettes  . Smokeless tobacco: Not on file  . Alcohol Use: No     Comment: not now    Review of Systems  Constitutional: Negative for fever.  HENT: Negative for congestion.   Respiratory: Negative for cough and shortness of breath.   Cardiovascular: Negative for chest pain.  Gastrointestinal: Negative for nausea, vomiting, abdominal pain and diarrhea.   Psychiatric/Behavioral: Positive for hallucinations.  All other systems reviewed and are negative.      Allergies  Review of patient's allergies indicates no known allergies.  Home Medications   Current Outpatient Rx  Name  Route  Sig  Dispense  Refill  . benztropine (COGENTIN) 1 MG tablet   Oral   Take 1 tablet (1 mg total) by mouth 2 (two) times daily.   60 tablet   0   . clonazePAM (KLONOPIN) 2 MG tablet   Oral   Take 1 tablet (2 mg total) by mouth 2 (two) times daily as needed (anxiety).   30 tablet   0   . divalproex (DEPAKOTE) 500 MG DR tablet   Oral   Take 1 tablet (500 mg total) by mouth 2 (two) times daily.   60 tablet   0   . haloperidol (HALDOL) 10 MG tablet   Oral   Take 1 tablet (10 mg total) by mouth 2 (two) times daily.   60 tablet   0   . haloperidol decanoate (HALDOL DECANOATE) 100 MG/ML injection   Intramuscular   Inject 1 mL (100 mg total) into the muscle every 30 (thirty) days.   1 mL   1     Last given on 07/05/2013   . lisinopril (PRINIVIL,ZESTRIL) 10 MG tablet   Oral   Take 1 tablet (10 mg total) by mouth daily.   30 tablet   0   . loxapine (LOXITANE) 25 MG capsule   Oral  Take 1 capsule (25 mg total) by mouth 4 (four) times daily.   120 capsule   0    BP 144/91  Pulse 112  Temp(Src) 99 F (37.2 C) (Oral)  Resp 20  SpO2 99% Physical Exam  Nursing note and vitals reviewed. Constitutional: He is oriented to person, place, and time. He appears well-developed and well-nourished. No distress.  HENT:  Head: Normocephalic and atraumatic.  Mouth/Throat: Oropharynx is clear and moist.  Eyes: Conjunctivae are normal. Pupils are equal, round, and reactive to light. No scleral icterus.  Neck: Neck supple.  Cardiovascular: Normal rate, regular rhythm, normal heart sounds and intact distal pulses.   No murmur heard. Pulmonary/Chest: Effort normal and breath sounds normal. No stridor. No respiratory distress. He has no wheezes. He  has no rales.  Abdominal: Soft. He exhibits no distension. There is no tenderness.  Musculoskeletal: Normal range of motion. He exhibits no edema.  Neurological: He is alert and oriented to person, place, and time.  Skin: Skin is warm and dry. No rash noted.  Psychiatric: He has a normal mood and affect. His behavior is normal. Thought content normal.    ED Course  Procedures (including critical care time) Labs Review Labs Reviewed  CBC - Abnormal; Notable for the following:    Platelets 130 (*)    All other components within normal limits  COMPREHENSIVE METABOLIC PANEL - Abnormal; Notable for the following:    BUN 4 (*)    GFR calc non Af Amer 89 (*)    All other components within normal limits  SALICYLATE LEVEL - Abnormal; Notable for the following:    Salicylate Lvl <2.0 (*)    All other components within normal limits  ACETAMINOPHEN LEVEL  ETHANOL  URINE RAPID DRUG SCREEN (HOSP PERFORMED)  VALPROIC ACID LEVEL   Imaging Review No results found.  EKG Interpretation   None       MDM   Final diagnoses:  Hallucinations    44 yo male with hx of schizophrenia presenting with command hallucinations telling him to yell out and shout.  Labs unremarkable.  No other symptoms or recent illnesses.  No signs or symptoms of organic disease.  Will place in psych hold.      Candyce Churn III, MD 08/29/13 [phone]
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
## 2013-08-29 NOTE — ED Notes (Signed)
Pt transferred from triage, presents for evaluation after [employer] brought pt in for hallucinating.  Pt reports he was kicked out of his group home, [facility].   Pt reports caregivers would not give him money.  Pt states [address] [place] [address] with his stepmother.  Pt reports he has not had his meds in 3 days.  Denies SI or HI.  Pt anxious but cooperative, stating he came here voluntary and wants to leave.

## 2013-08-29 NOTE — ED Notes (Signed)
Pt brought in by [employer] voluntarily for hallucinations that he wants evaluated.

## 2013-08-29 NOTE — ED Notes (Signed)
Pt talking to himself, responding to internal stimuli, cursing, talking loudly.  NP Drenda FreezeFran notified.

## 2013-08-29 NOTE — ED Notes (Signed)
TTS Katherine at bedside to eval pt.

## 2013-08-30 DIAGNOSIS — F200 Paranoid schizophrenia: Secondary | ICD-10-CM

## 2013-08-30 DIAGNOSIS — R443 Hallucinations, unspecified: Secondary | ICD-10-CM

## 2013-08-30 LAB — VALPROIC ACID LEVEL: Valproic Acid Lvl: 56.9 ug/mL (ref 50.0–100.0)

## 2013-08-30 MED ORDER — DIVALPROEX SODIUM 500 MG PO DR TAB: 500.0000 mg | DELAYED_RELEASE_TABLET | Freq: Two times a day (BID) | ORAL | Status: DC
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
## 2013-08-30 NOTE — BH Assessment (Signed)
LCS[facility] completed [facility] face to face with patient between 10:30 PM and 10:50 PM.  Patient reports contacted [employer] for transport to the hospital after he was "kicked out' of group home due to conflict with another group home resident. He would like to discharge tonight to mother's home and provided LCSW with mother's contact information. Mother is reportedly Flossie BuffyQueen Alfonse and can be reached at [phone] and [phone]; neither line was answered when LCSW called at 10:59 PM. No message was left as LCSW shift was to end soon.   Patient [facility] at 10:50,  [facility] as [facility].  Patient to be reassessed/reviewed on Friday 08/30/2013 and referred out if decided pt needs in patient treatment and  [facility].  Carney Bernatherine C Harrill, LCSW
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
## 2013-08-30 NOTE — [facility] Suicide Risk Assessment (Cosign Needed)
Suicide Risk Assessment  Discharge Assessment     Demographic Factors:  Adolescent or young adult, Low socioeconomic status and Unemployed  Total Time spent with patient: 15 minutes  Psychiatric Specialty Exam:     Blood pressure 124/83, pulse 95, temperature 97.3 F (36.3 C), temperature source Oral, resp. rate 20, SpO2 97.00%.There is no weight on file to calculate BMI.  General Appearance: Casual  Eye Contact::  Good  Speech:  Clear and Coherent  Volume:  Normal  Mood:  Depressed  Affect:  Congruent  Thought Process:  Coherent and Goal Directed  Orientation:  Full (Time, Place, and Person)  Thought Content:  NA  Suicidal Thoughts:  No  Homicidal Thoughts:  No  Memory:  Immediate;   Good Recent;   Good Remote;   Good  Judgement:  Fair  Insight:  Good and Fair  Psychomotor Activity:  Normal  Concentration:  Good  Recall:  NA  Fund of Knowledge:Good  Language: Good  Akathisia:  NA  Handed:  Right  AIMS (if indicated):     Assets:  Desire for Improvement  Sleep:       Musculoskeletal: Strength & Muscle Tone: within normal limits Gait & Station: normal Patient leans: N/A   Mental Status Per Nursing Assessment::  Well groomed, alert and oriented x3.  Cooperative, and participated in the interview. On Admission:     Current Mental Status by Physician: NA  Loss Factors: NA  Historical Factors: Impulsivity  Risk Reduction Factors:   Positive social support and Family visits him in his group home  Continued Clinical Symptoms:  Schizophrenia:   Command hallucinatons Paranoid or undifferentiated type Previous Psychiatric Diagnoses and Treatments  Cognitive Features That Contribute To Risk:  Polarized thinking Thought constriction (tunnel vision)    Suicide Risk:  Minimal: No identifiable suicidal ideation.  Patients presenting with no risk factors but with morbid ruminations; may be classified as minimal risk based on the severity of the depressive  symptoms  Discharge Diagnoses:   AXIS I:  Schizophrenia AXIS II:  Deferred AXIS III:   Past Medical History  Diagnosis Date  . Schizophrenia   . Hypertension   . Pneumonia     had aspiration pneumonia 5/14-on vent   AXIS IV:  other psychosocial or environmental problems and problems related to social environment AXIS V:  51-60 moderate symptoms  Plan Of Care/Follow-up recommendations:  Activity:  as tolerated Diet:  diabetic  Is patient on multiple antipsychotic therapies at discharge:  No   Has Patient had three or more failed trials of antipsychotic monotherapy by history:  No  Recommended Plan for Multiple Antipsychotic Therapies: NA    Dahlia ByesONUOHA, Marilea Gwynne, C   PMHNP-BC 08/30/2013, 4:03 PM
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
## 2013-09-05 ENCOUNTER — Encounter (HOSPITAL_COMMUNITY): Payer: Self-pay | Admitting: Emergency Medicine

## 2013-09-05 ENCOUNTER — Emergency Department (HOSPITAL_COMMUNITY)
Admission: EM | Admit: 2013-09-05 | Discharge: 2013-09-05 | Discharge status: Against Medical Advice | Payer: PRIVATE HEALTH INSURANCE | Attending: Emergency Medicine | Admitting: Emergency Medicine

## 2013-09-05 ENCOUNTER — Other Ambulatory Visit: Payer: Self-pay

## 2013-09-05 ENCOUNTER — Emergency Department (HOSPITAL_COMMUNITY): Payer: PRIVATE HEALTH INSURANCE

## 2013-09-05 DIAGNOSIS — IMO0002 Reserved for concepts with insufficient information to code with codable children: Secondary | ICD-10-CM | POA: Insufficient documentation

## 2013-09-05 DIAGNOSIS — R0789 Other chest pain: Secondary | ICD-10-CM

## 2013-09-05 DIAGNOSIS — R110 Nausea: Secondary | ICD-10-CM | POA: Insufficient documentation

## 2013-09-05 DIAGNOSIS — Z79899 Other long term (current) drug therapy: Secondary | ICD-10-CM | POA: Insufficient documentation

## 2013-09-05 DIAGNOSIS — F17200 Nicotine dependence, unspecified, uncomplicated: Secondary | ICD-10-CM | POA: Insufficient documentation

## 2013-09-05 DIAGNOSIS — Z8701 Personal history of pneumonia (recurrent): Secondary | ICD-10-CM | POA: Insufficient documentation

## 2013-09-05 DIAGNOSIS — F209 Schizophrenia, unspecified: Secondary | ICD-10-CM | POA: Insufficient documentation

## 2013-09-05 DIAGNOSIS — I10 Essential (primary) hypertension: Secondary | ICD-10-CM | POA: Insufficient documentation

## 2013-09-05 NOTE — Discharge Instructions (Signed)
YOU ARE LEAVING AGAINST MEDICAL ADVISE! Please follow up with your primary care physician in 1-2 days. If you do not have one please call the [facility] number listed above. Please read all discharge instructions and return precautions.    Chest Pain (Nonspecific) It is often hard to give a specific diagnosis for the cause of chest pain. There is always a chance that your pain could be related to something serious, such as a heart attack or a blood clot in the lungs. You need to follow up with your caregiver for further evaluation. CAUSES   Heartburn.  Pneumonia or bronchitis.  Anxiety or stress.  Inflammation around your heart (pericarditis) or lung (pleuritis or pleurisy).  A blood clot in the lung.  A collapsed lung (pneumothorax). It can develop suddenly on its own (spontaneous pneumothorax) or from injury (trauma) to the chest.  Shingles infection (herpes zoster virus). The chest wall is composed of bones, muscles, and cartilage. Any of these can be the source of the pain.  The bones can be bruised by injury.  The muscles or cartilage can be strained by coughing or overwork.  The cartilage can be affected by inflammation and become sore (costochondritis). DIAGNOSIS  Lab tests or other studies, such as X-rays, electrocardiography, stress testing, or cardiac imaging, may be needed to find the cause of your pain.  TREATMENT   Treatment depends on what may be causing your chest pain. Treatment may include:  Acid blockers for heartburn.  Anti-inflammatory medicine.  Pain medicine for inflammatory conditions.  Antibiotics if an infection is present.  You may be advised to change lifestyle habits. This includes stopping smoking and avoiding alcohol, caffeine, and chocolate.  You may be advised to keep your head raised (elevated) when sleeping. This reduces the chance of acid going backward from your stomach into your esophagus.  Most of the time,  nonspecific chest pain will improve within 2 to 3 days with rest and mild pain medicine. HOME CARE INSTRUCTIONS   If antibiotics were prescribed, take your antibiotics as directed. Finish them even if you start to feel better.  For the next few days, avoid physical activities that bring on chest pain. Continue physical activities as directed.  Do not smoke.  Avoid drinking alcohol.  Only take over-the-counter or prescription medicine for pain, discomfort, or fever as directed by your caregiver.  Follow your caregiver's suggestions for further testing if your chest pain does not go away.  Keep any follow-up appointments you made. If you do not go to an appointment, you could develop lasting (chronic) problems with pain. If there is any problem keeping an appointment, you must call to reschedule. SEEK MEDICAL CARE IF:   You think you are having problems from the medicine you are taking. Read your medicine instructions carefully.  Your chest pain does not go away, even after treatment.  You develop a rash with blisters on your chest. SEEK IMMEDIATE MEDICAL CARE IF:   You have increased chest pain or pain that spreads to your arm, neck, jaw, back, or abdomen.  You develop shortness of breath, an increasing cough, or you are coughing up blood.  You have severe back or abdominal pain, feel nauseous, or vomit.  You develop severe weakness, fainting, or chills.  You have a fever. THIS IS AN EMERGENCY. Do not wait to see if the pain will go away. Get medical help at once. Call your local emergency services (911 in [place] Do not drive yourself  to the hospital. MAKE SURE YOU:   Understand these instructions.  Will watch your condition.  Will get help right away if you are not doing well or get worse. Document Released: 04/06/2005 Document Revised: 09/19/2011 Document Reviewed: 01/31/2008 [facility] Patient Information 2014 [place].

## 2013-09-05 NOTE — ED Notes (Signed)
Pt arrives vai EMS from group home. C/o chest pain starting 8:15pm. Pt was smoking cigaratte at onset. Pain is substernal, raidiates to right arm. 8/10. No improvement with NTG (2 NTG given, 324 ASA given by EMS). 20 LW. VSS. EKG unremarkable.

## 2013-09-05 NOTE — ED Notes (Addendum)
Pt in room requesting a cup of coffee and something to eat, wanting to know who the dr is that is going to see him and stating that he does not want to go back to his group home tonight and wants to stay the night here. Explained to pt that he cannot have anything to eat or drink until he is seen by the physician and the physician will decide if he needs to stay in the hospital.

## 2013-09-05 NOTE — ED Provider Notes (Signed)
CSN: [id number]     Arrival date & time 09/05/13  2111 History   First MD Initiated Contact with Patient 09/05/13 2132     Chief Complaint  Patient presents with  . Chest Pain     (Consider location/radiation/quality/duration/timing/severity/associated sxs/prior Treatment) HPI Comments: Patient is a 43 year old male past medical history significant for schizophrenia, hypertension presenting to the emergency department from his group home for burning central chest pain with radiation to bilateral shoulders that occurred after patient finished smoking a cigarette. He denies any associated nausea, vomiting, shortness of breath, cough, diaphoresis. Patient states he has had similar episodes in the past, it was precipitated by cigarette smoke. He states that his pain has been improving, it was a 8/10 prior to arrival and now is 4/10. He states he was given 324mg  of ASA and 2 SL nitroglycerin via EMS. He states he has had an echocardiogram several years ago in [place]. Denies any history of cardiac catheterization. Denies any familial cardiac history.    Past Medical History  Diagnosis Date  . Schizophrenia   . Hypertension   . Pneumonia     had aspiration pneumonia 5/14-on vent   Past Surgical History  Procedure Laterality Date  . Arm surgery      hurt his arm 25 hr ago  . Open reduction internal fixation (orif) proximal phalanx Right 04/02/2013    Procedure: OPEN REDUCTION INTERNAL FIXATION (ORIF) RIGHT SMALL FINGER;  Surgeon: Tami Ribas, MD;  Location: [facility];  Service: Orthopedics;  Laterality: Right;   No family history on file. History  Substance Use Topics  . Smoking status: Current Every Day Smoker -- 0.25 packs/day    Types: Cigarettes  . Smokeless tobacco: Not on file  . Alcohol Use: No     Comment: not now    Review of Systems  Constitutional: Negative for fever and chills.  Respiratory: Negative for cough, chest tightness and shortness of breath.    Cardiovascular: Positive for chest pain.  Gastrointestinal: Negative for nausea, vomiting, abdominal pain and diarrhea.  All other systems reviewed and are negative.      Allergies  Review of patient's allergies indicates no known allergies.  Home Medications   Current Outpatient Rx  Name  Route  Sig  Dispense  Refill  . clonazePAM (KLONOPIN) 1 MG tablet   Oral   Take 1 mg by mouth 4 (four) times daily.         . divalproex (DEPAKOTE ER) 500 MG 24 hr tablet   Oral   Take 500 mg by mouth every morning.         . divalproex (DEPAKOTE ER) 500 MG 24 hr tablet   Oral   Take 1,000 mg by mouth at bedtime.         . haloperidol (HALDOL) 10 MG tablet   Oral   Take 20 mg by mouth 2 (two) times daily.          . haloperidol decanoate (HALDOL DECANOATE) 100 MG/ML injection   Intramuscular   Inject 125 mg into the muscle every 28 (twenty-eight) days.         [name] Kitchen loxapine (LOXITANE) 25 MG capsule   Oral   Take 25 mg by mouth 3 (three) times daily.          BP 121/81  Pulse 71  Temp(Src) 98.7 F (37.1 C) (Oral)  Resp 20  Ht 5\' 8"  (1.727 m)  Wt 258 lb (117.028 kg)  BMI 39.24  kg/m2  SpO2 98% Physical Exam  Nursing note and vitals reviewed. Constitutional: He is oriented to person, place, and time. He appears well-developed and well-nourished. No distress.  HENT:  Head: Normocephalic and atraumatic.  Right Ear: External ear normal.  Left Ear: External ear normal.  Nose: Nose normal.  Mouth/Throat: No oropharyngeal exudate.  Eyes: Conjunctivae are normal.  Neck: Neck supple.  Cardiovascular: Normal rate, regular rhythm, normal heart sounds and intact distal pulses.   Pulmonary/Chest: Effort normal and breath sounds normal. No respiratory distress. He exhibits tenderness.  Abdominal: Soft. There is no tenderness.  Musculoskeletal: Normal range of motion. He exhibits no edema.  Lymphadenopathy:    He has no cervical adenopathy.  Neurological: He is alert and  oriented to person, place, and time.  Skin: Skin is warm and dry. He is not diaphoretic.  Psychiatric: He has a normal mood and affect. His speech is normal and behavior is normal. Judgment and thought content normal. Cognition and memory are normal.    ED Course  Procedures (including critical care time) Medications - No data to display  Labs Review Labs Reviewed  I-STAT TROPOININ, ED  I-STAT CHEM 8, ED   Imaging Review Dg Chest Portable 1 View  09/05/2013   CLINICAL DATA:  Chest pain and shortness of breath  EXAM: PORTABLE CHEST - 1 VIEW  COMPARISON:  11/17/2012  FINDINGS: The cardiomediastinal silhouette is unremarkable.  There is no evidence of focal airspace disease, pulmonary edema, suspicious pulmonary nodule/mass, pleural effusion, or pneumothorax. No acute bony abnormalities are identified.  IMPRESSION: No active disease.   Electronically Signed   By: Laveda AbbeJeff  Hu M.D.   On: 09/05/2013 22:47    EKG Interpretation   None       MDM   Final diagnoses:  Chest pain, atypical    Filed Vitals:   09/05/13 2130  BP: 121/81  Pulse: 71  Temp:   Resp: 20    Afebrile, NAD, non-toxic appearing, AAOx4. Patient presenting with substernal chest pain after smoking a cigarette without associated shortness of breath, diaphoresis, nausea, vomiting. Patient has some chest tenderness on palpation. Lungs are otherwise clear to auscultation. He has a regular rate and rhythm. Exam is otherwise unremarkable. Patient is agitated and nauseous not seen impressions the department for workup. Initially able to talk patient into staying for chest x-ray and blood work. Chest x-ray was obtained and labs were sent. Patient again decided he no longer wants to stay in the emergency department with punctilious against medical advise. Patient understands the risks of leaving the emergency department before his workup is complete and is agreeable to sign out AGAINST MEDICAL ADVICE prior to completing full workup  on him. Patient has clear thought process and is oriented to situation. He is appropriate to make his own medical decisions at this time. Patient signed out AGAINST MEDICAL ADVICE. Patient d/w with Dr. Micheline Mazeocherty, agrees with plan.     Lise AuerJennifer L Diane Hanel, PA-C 09/06/13 0008

## 2013-09-06 NOTE — ED Provider Notes (Signed)
Medical screening examination/treatment/procedure(s) were conducted as a shared visit with non-physician practitioner(s) and myself.  I personally evaluated the patient during the encounter. Pt brought form group home for CP after smoking a cigarette. CP resolved prior to arrival & pt has decided wants to leave as soon as being roomed. He will not allow imaging, lab draws, and will not stay in bed to place on monitor, get EKG. He understands I cannot r/o MI w/o the above and that this could lead to death. He is able to repeat the risks. He is A&Ox4, and I believe he has the capacity to make this decision. He will leave against medical advice and go back to group home. He assues me he will return if he begins having CP again.   EKG Interpretation  None    Shanna CiscoMegan E Docherty, MD 09/06/13 1327

## 2016-08-30 ENCOUNTER — Encounter (HOSPITAL_COMMUNITY): Payer: Self-pay | Admitting: Emergency Medicine

## 2016-08-30 ENCOUNTER — Emergency Department (HOSPITAL_COMMUNITY)
Admission: EM | Admit: 2016-08-30 | Discharge: 2016-09-02 | Disposition: A | Discharge status: Home / Self Care | Payer: Medicaid Other | Attending: Emergency Medicine | Admitting: Emergency Medicine

## 2016-08-30 DIAGNOSIS — F10920 Alcohol use, unspecified with intoxication, uncomplicated: Secondary | ICD-10-CM

## 2016-08-30 DIAGNOSIS — F3289 Other specified depressive episodes: Secondary | ICD-10-CM | POA: Diagnosis not present

## 2016-08-30 DIAGNOSIS — Z79899 Other long term (current) drug therapy: Secondary | ICD-10-CM | POA: Insufficient documentation

## 2016-08-30 DIAGNOSIS — I10 Essential (primary) hypertension: Secondary | ICD-10-CM | POA: Insufficient documentation

## 2016-08-30 DIAGNOSIS — F17210 Nicotine dependence, cigarettes, uncomplicated: Secondary | ICD-10-CM | POA: Insufficient documentation

## 2016-08-30 DIAGNOSIS — F10120 Alcohol abuse with intoxication, uncomplicated: Secondary | ICD-10-CM | POA: Insufficient documentation

## 2016-08-30 DIAGNOSIS — F200 Paranoid schizophrenia: Secondary | ICD-10-CM | POA: Diagnosis not present

## 2016-08-30 DIAGNOSIS — F329 Major depressive disorder, single episode, unspecified: Secondary | ICD-10-CM | POA: Diagnosis present

## 2016-08-30 LAB — CBC WITH DIFFERENTIAL/PLATELET
BASOS PCT: 0 %
Basophils Absolute: 0.1 10*3/uL (ref 0.0–0.1)
Eosinophils Absolute: 0.1 10*3/uL (ref 0.0–0.7)
Eosinophils Relative: 1 %
HEMATOCRIT: 42.9 % (ref 39.0–52.0)
HEMOGLOBIN: 14.5 g/dL (ref 13.0–17.0)
Lymphocytes Relative: 26 %
Lymphs Abs: 2.9 10*3/uL (ref 0.7–4.0)
MCH: 28.7 pg (ref 26.0–34.0)
MCHC: 33.8 g/dL (ref 30.0–36.0)
MCV: 85 fL (ref 78.0–100.0)
MONOS PCT: 10 %
Monocytes Absolute: 1.2 10*3/uL — ABNORMAL HIGH (ref 0.1–1.0)
NEUTROS ABS: 7.1 10*3/uL (ref 1.7–7.7)
NEUTROS PCT: 63 %
Platelets: 146 10*3/uL — ABNORMAL LOW (ref 150–400)
RBC: 5.05 MIL/uL (ref 4.22–5.81)
RDW: 14 % (ref 11.5–15.5)
WBC: 11.4 10*3/uL — ABNORMAL HIGH (ref 4.0–10.5)

## 2016-08-30 LAB — BASIC METABOLIC PANEL
Anion gap: 7 (ref 5–15)
BUN: 9 mg/dL (ref 6–20)
CALCIUM: 9.1 mg/dL (ref 8.9–10.3)
CHLORIDE: 104 mmol/L (ref 101–111)
CO2: 26 mmol/L (ref 22–32)
CREATININE: 0.8 mg/dL (ref 0.61–1.24)
GFR calc Af Amer: >60 mL/min (ref >60)
GFR calc non Af Amer: >60 mL/min (ref >60)
Glucose, Bld: 89 mg/dL (ref 65–99)
Potassium: 3.5 mmol/L (ref 3.5–5.1)
Sodium: 137 mmol/L (ref 135–145)

## 2016-08-30 LAB — SALICYLATE LEVEL: Salicylate Lvl: <7 mg/dL (ref 2.8–30.0)

## 2016-08-30 LAB — ETHANOL: Alcohol, Ethyl (B): 5 mg/dL — ABNORMAL HIGH (ref <5)

## 2016-08-30 LAB — ACETAMINOPHEN LEVEL: Acetaminophen (Tylenol), Serum: <10 ug/mL — ABNORMAL LOW (ref 10–30)

## 2016-08-30 NOTE — ED Notes (Signed)
Pt placed in maroon scrubs and wanded by security. 

## 2016-08-30 NOTE — ED Notes (Signed)
Sandwich, cheese, crackers, and soda provided to pt

## 2016-08-30 NOTE — ED Provider Notes (Signed)
[facility]-EMERGENCY DEPT Provider Note   CSN: [id number] Arrival date & time: 08/30/16  2022     History   Chief Complaint Chief Complaint  Patient presents with  . Medical Clearance    HPI [name] [name] is a 46 y.o. male.  HPI   Patient is a 46 year old male with history of hypertension and schizophrenia who presents to the ED via [employer] under IVC. Patient reports he was brought to the ED and was IVC today due to drinking alcohol. Patient endorses drinking 1 shot of vodka earlier this afternoon at his family's house. Patient reports worsening depression over the past few days. Denies SI, HI. Patient reports having auditory hallucinations which he states have been present for years. Denies any drug use. Patient denies any pain or complaints at this time.  [employer] report that patient is from [place] and currently resides at [facility] due to pt being deemed incompetent by the court.  [employer] state that patient was found drinking at the facility this week resulting in the staff withholding his medication. [facility] by staff due to request from the patient. Patient was discharged home from the emergency department with a bus ticket which the patient used to get a bus ride to [place] to [place] his family. [facility] reported him missing person report which is when [employer] located the [facility] and [facility] and place under IVC.   Past Medical History:  Diagnosis Date  . Hypertension   . Pneumonia    had aspiration pneumonia 5/14-on vent  . Schizophrenia [facility](HCC)     Patient Active Problem List   Diagnosis Date Noted  . Paranoid schizophrenia (HCC) 04/20/2013  . Acute respiratory failure with hypoxia (HCC) 11/17/2012  . Aspiration pneumonia (HCC) 11/17/2012  . Encephalopathy acute 11/17/2012    Past Surgical History:  Procedure Laterality Date  . arm surgery     hurt his arm 25 hr ago  . OPEN REDUCTION INTERNAL FIXATION (ORIF) PROXIMAL  PHALANX Right 04/02/2013   Procedure: OPEN REDUCTION INTERNAL FIXATION (ORIF) RIGHT SMALL FINGER;  Surgeon: Tami RibasKevin R Kuzma, MD;  Location: [facility];  Service: Orthopedics;  Laterality: Right;       Home Medications    Prior to Admission medications   Medication Sig Start Date End Date Taking? Authorizing Provider  clonazePAM (KLONOPIN) 1 MG tablet Take 1 mg by mouth 4 (four) times daily.    Historical Provider, MD  divalproex (DEPAKOTE ER) 500 MG 24 hr tablet Take 500 mg by mouth every morning.    Historical Provider, MD  divalproex (DEPAKOTE ER) 500 MG 24 hr tablet Take 1,000 mg by mouth at bedtime.    Historical Provider, MD  haloperidol (HALDOL) 10 MG tablet Take 20 mg by mouth 2 (two) times daily.     Historical Provider, MD  haloperidol decanoate (HALDOL DECANOATE) 100 MG/ML injection Inject 125 mg into the muscle every 28 (twenty-eight) days.    Historical Provider, MD  loxapine (LOXITANE) 25 MG capsule Take 25 mg by mouth 3 (three) times daily.    Historical Provider, MD    Family History History reviewed. No pertinent family history.  Social History Social History  Substance Use Topics  . Smoking status: Current Every Day Smoker    Packs/day: 0.25    Types: Cigarettes  . Smokeless tobacco: Not on file  . Alcohol use No     Comment: not now     Allergies   Patient has no  known allergies.   Review of Systems Review of Systems  Psychiatric/Behavioral: Positive for behavioral problems and hallucinations.  All other systems reviewed and are negative.    Physical Exam Updated Vital Signs BP 124/67 (BP Location: Left Arm)   Pulse 75   Temp 98.7 F (37.1 C) (Oral)   Resp 18   SpO2 93%   Physical Exam  Constitutional: He is oriented to person, place, and time. He appears well-developed and well-nourished. No distress.  HENT:  Head: Normocephalic and atraumatic.  Eyes: Conjunctivae and EOM are normal. Right eye exhibits no discharge. Left eye  exhibits no discharge. No scleral icterus.  Neck: Normal range of motion. Neck supple.  Cardiovascular: Normal rate, regular rhythm, normal heart sounds and intact distal pulses.   Pulmonary/Chest: Effort normal and breath sounds normal. No respiratory distress. He has no wheezes. He has no rales. He exhibits no tenderness.  Abdominal: Soft. Bowel sounds are normal. He exhibits no distension and no mass. There is no tenderness. There is no rebound and no guarding.  Musculoskeletal: Normal range of motion. He exhibits no edema.  Neurological: He is alert and oriented to person, place, and time.  Skin: Skin is warm and dry. He is not diaphoretic.  Psychiatric: His affect is blunt. His speech is delayed. He is withdrawn. He expresses inappropriate judgment. He expresses no homicidal and no suicidal ideation.  Nursing note and vitals reviewed.    ED Treatments / Results  Labs (all labs ordered are listed, but only abnormal results are displayed) Labs Reviewed  CBC WITH DIFFERENTIAL/PLATELET  BASIC METABOLIC PANEL  ACETAMINOPHEN LEVEL  SALICYLATE LEVEL  RAPID URINE DRUG SCREEN, HOSP PERFORMED  ETHANOL    EKG  EKG Interpretation None       Radiology No results found.  Procedures Procedures (including critical care time)  Medications Ordered in ED Medications - No data to display   Initial Impression / Assessment and Plan / ED Course  I have reviewed the triage vital signs and the nursing notes.  Pertinent labs & imaging results that were available during my care of the patient were reviewed by me and considered in my medical decision making (see chart for details).     Patient presents under IVC for medical clearance. [employer] reports that patient resides at [facility] in [place] but took a bus to [place] to visit his family after he was discharged from the emergency department for psych evaluation. Patient endorses hallucinations, denies SI/HI. Denies any pain or  complaints. Hx of schizophrenia. VSS. Exam unremarkable. Labs ordered for med clearance. Pending TTS consult.  Final Clinical Impressions(s) / ED Diagnoses   Final diagnoses:  None    New Prescriptions New Prescriptions   No medications on file     Barrett Henle, PA-C 08/30/16 2220    Lyndal Pulley, MD 09/01/16 [phone]

## 2016-08-30 NOTE — BH Assessment (Addendum)
Tele Assessment Note   [name] CarryDwight [name] is an 46 y.o. married male who presents to [facility] after he was petitioned for involuntary commitment by Patent examinerlaw enforcement. Affidavit and Petition by [name] KyleJL [name], investigating officer [phone], states: "The respondent has been deemed incompetent by the [place] Courts. The respondent was place in [facility] in [place], [place]. The respondent stopped taking his medications. The respondent got hospital staff to discharge him and they bought his bus ticket to [place], [place]. The respondent has been ingesting alcohol. The respondent cannot provide for himself and is incompetent. The respondent is a danger to himself."  Pt's medical record indicates he has a long history of schizophrenia. His sister, [name] [name] [phone] is his legal guardian and guardianship papers are in Epic. Per Pt and law enforcement, Pt has resided in [facility] for the past four years-- staff contact is Environmental health practitionerWittner [name] [phone]. Per law enforcement, Pt was not taking his medications and began drinking alcohol so the staff at [facility] Pt to [facility] in [place] to evaluate for alcohol abuse and resume medications. Per law enforcement, when Pt was discharged from [facility] their staff did not contact [facility] and, at [employer]'s request, they gave Pt a bus pass to visit his family in [place]. Pt reports he drank one 16-ounce beer and one shot of liquor today. When he arrived unannounced at his family's home they said he couldn't stay there. Pt says, "I was talking to myself and didn't know what was going on." Family called law enforcement, who realized staff at [facility] filed a missing person report. Per law enforcement, because of Pt's mental state and incompetency they petitioned for IVC and transported [facility] to [facility].   Pt reports he feels sad due to "family problems." He report auditory hallucinations of people  screaming. Pt appears to be experiencing thought blocking. Per Pt's medical record, he has a history of responding to hallucinations and disruptive behavior when off medications. Pt denies current suicidal ideation or history of suicide attempts. He denies current homicidal ideation or history of violence. 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 He reports he has been drinking alcohol recently; Pt's blood alcohol level is 5 and urine drug screen is pending. Pt says he is prescribed Haldol, Cogentin and Risperdal. He says he has been years since he was psychiatrically hospitalized.  Pt is dressed in hospital scrubs, drowsy, oriented x4 with soft, slow speech and normal motor behavior. Pt frequently stops talking in med sentence and stares blankly. Pt's mood is sad and affect is congruent with mood. Thought process is coherent and relevant. Pt was calm and cooperative throughout assessment.   Diagnosis: Schizophrenia  Past Medical History:  Past Medical History:  Diagnosis Date  . Hypertension   . Pneumonia    had aspiration pneumonia 5/14-on vent  . Schizophrenia [facility](HCC)     Past Surgical History:  Procedure Laterality Date  . arm surgery     hurt his arm 25 hr ago  . OPEN REDUCTION INTERNAL FIXATION (ORIF) PROXIMAL PHALANX Right 04/02/2013   Procedure: OPEN REDUCTION INTERNAL FIXATION (ORIF) RIGHT SMALL FINGER;  Surgeon: Tami RibasKevin R Kuzma, MD;  Location: [facility];  Service: Orthopedics;  Laterality: Right;    Family History: History reviewed. No pertinent family history.  Social History:  reports that he has been smoking Cigarettes.  He has been smoking about 0.25 packs  per day. He does not have any smokeless tobacco history on file. He reports that he does not drink alcohol or use drugs.  Additional Social History:  Alcohol / Drug Use Pain Medications: See medication list Prescriptions: See medication list Over the Counter: See  medication list History of alcohol / drug use?: Yes (Pt reports he has a history of using cocaine and marijuana in the past) Longest period of sobriety (when/how long): Four years  CIWA: CIWA-Ar BP: 124/67 Pulse Rate: 75 COWS:    PATIENT STRENGTHS: (choose at least two) Licensed conveyancer Physical Health Supportive family/friends  Allergies: No Known Allergies  Home Medications:  (Not in a hospital admission)  OB/GYN Status:  No LMP for male patient.  General Assessment Data [facility] of Assessment: [facility] TTS Assessment: In system Is this a Tele or Face-to-Face Assessment?: Face-to-Face Is this an Initial Assessment or a Re-assessment for this encounter?: Initial Assessment Marital status: Married [place] name: NA Is patient pregnant?: No Pregnancy Status: No Living Arrangements: Other (Comment) [facility]) Can pt return to current living arrangement?: Yes (Unknown) Admission Status: Involuntary Is patient capable of signing voluntary admission?: No Referral Source: Self/Family/Friend Insurance type: Medicaid     Crisis Care Plan Living Arrangements: Other (Comment) [facility]) Legal Guardian: Other relative (Sister: Physiological scientist) Name of Psychiatrist: Unknown Name of Therapist: Unknown  Education Status Is patient currently in school?: No Current Grade: NA Highest grade of school patient has completed: 5 Name of school: NA Contact person: NA  Risk to self with the past 6 months Suicidal Ideation: No Has patient been a risk to self within the past 6 months prior to admission? : No Suicidal Intent: No Has patient had any suicidal intent within the past 6 months prior to admission? : No Is patient at risk for suicide?: No Suicidal Plan?: No Has patient had any suicidal plan within the past 6 months prior to admission? : No Access to Means: No What has been your use of drugs/alcohol within  the last 12 months?: Pt reports drinking alcohol Previous Attempts/Gestures: No How many times?: 0 Other Self Harm Risks: None Triggers for Past Attempts: None known Intentional Self Injurious Behavior: None Family Suicide History: Unknown Recent stressful life event(s): Other (Comment) (Was not discharged back to his residence) Persecutory voices/beliefs?: Yes Depression: Yes Depression Symptoms: Despondent, Fatigue Substance abuse history and/or treatment for substance abuse?: Yes Suicide prevention information given to non-admitted patients: Not applicable  Risk to Others within the past 6 months Homicidal Ideation: No Does patient have any lifetime risk of violence toward others beyond the six months prior to admission? : No Thoughts of Harm to Others: No Current Homicidal Intent: No Current Homicidal Plan: No Access to Homicidal Means: No Identified Victim: None History of harm to others?: No Assessment of Violence: None Noted Violent Behavior Description: None Does patient have access to weapons?: No Criminal Charges Pending?: No Does patient have a court date: No Is patient on probation?: No  Psychosis Hallucinations: Auditory (Pt reports ongoing auditory hallucinations) Delusions: None noted  Mental Status Report Appearance/Hygiene: In scrubs Eye Contact: Good Motor Activity: Unremarkable Speech: Soft, Slow Level of Consciousness: Drowsy Mood: Sad Affect: Appropriate to circumstance Anxiety Level: None Thought Processes: Coherent, Relevant Judgement: Partial Orientation: Person, Place, Time, Situation, Appropriate for developmental age Obsessive Compulsive Thoughts/Behaviors: None  Cognitive Functioning Concentration: Fair Memory: Recent Intact, Remote Intact IQ: Average Insight: Fair Impulse Control: Fair Appetite: Good Weight Loss: 0 Weight  Gain: 0 Sleep: Decreased Total Hours of Sleep: 6 Vegetative Symptoms: None  ADLScreening [facility] Assessment  Services) Patient's cognitive ability adequate to safely complete daily activities?: Yes Patient able to express need for assistance with ADLs?: Yes Independently performs ADLs?: Yes (appropriate for developmental age)  Prior Inpatient Therapy Prior Inpatient Therapy: Yes Prior Therapy Dates: 2014, multiple admits Prior Therapy Facilty/Provider(s): [facility], Letitia Caul Reason for Treatment: Schizophrenia  Prior Outpatient Therapy Prior Outpatient Therapy: Yes Prior Therapy Dates: Current Prior Therapy Facilty/Provider(s): Unknown provider in [place] Reason for Treatment: Schizophrenia Does patient have an ACCT team?: No Does patient have Intensive In-House Services?  : No Does patient have [facility] services? : No Does patient have P4CC services?: No  ADL Screening (condition at time of admission) Patient's cognitive ability adequate to safely complete daily activities?: Yes Is the patient deaf or have difficulty hearing?: No Does the patient have difficulty seeing, even when wearing glasses/contacts?: No Does the patient have difficulty concentrating, remembering, or making decisions?: No Patient able to express need for assistance with ADLs?: Yes Does the patient have difficulty dressing or bathing?: No Independently performs ADLs?: Yes (appropriate for developmental age) Does the patient have difficulty walking or climbing stairs?: No Weakness of Legs: None Weakness of Arms/Hands: None  Home Assistive Devices/Equipment Home Assistive Devices/Equipment: None    Abuse/Neglect Assessment (Assessment to be complete while patient is alone) Physical Abuse: Denies Verbal Abuse: Denies Sexual Abuse: Denies Exploitation of patient/patient's resources: Denies Self-Neglect: Denies     Merchant navy officer (For Healthcare) Does Patient Have a Medical Advance Directive?: No Would patient like information on creating a medical advance directive?: No - Patient declined     Additional Information 1:1 In Past 12 Months?: No CIRT Risk: No Elopement Risk: No Does patient have medical clearance?: Yes     Disposition: Clint Bolder, AC at [facility], confirmed [address]hall is currently at capacity. Gave clinical report to Donell Sievert, PA who recommends Pt be evaluated by psychiatry in the morning and that social work contact [facility] if Pt is released from IVC. Notified Lyndal Pulley, MD and TCU staff of recommendation.  Disposition Initial Assessment Completed for this Encounter: Yes Disposition of Patient: Other dispositions Other disposition(s): Other (Comment)   Pamalee Leyden, [facility], [facility], [facility] Triage Specialist [phone]   Pamalee Leyden 08/30/2016 10:43 PM

## 2016-08-30 NOTE — ED Notes (Signed)
Two unsuccessful blood draw attempts by this nurse.

## 2016-08-30 NOTE — ED Triage Notes (Addendum)
Pt BIB [employer] who found pt at a family member's house; pt lives at [facility] in [place]; Pt has been declared incompetent; this past week pt has been drinking alcohol so facility staff have been withholding his medication; pt was [facility] by staff as pt requested; pt was discharged with a bus ticket and traveled to [place]; Missing Person Report filed by [facility]; [facility] here in [place]; pt under IVC  Caregiver at [facility]: [employer] Wright

## 2016-08-31 DIAGNOSIS — F200 Paranoid schizophrenia: Secondary | ICD-10-CM | POA: Diagnosis not present

## 2016-08-31 DIAGNOSIS — F17210 Nicotine dependence, cigarettes, uncomplicated: Secondary | ICD-10-CM

## 2016-08-31 LAB — RAPID URINE DRUG SCREEN, HOSP PERFORMED
AMPHETAMINES: NOT DETECTED
Barbiturates: NOT DETECTED
Benzodiazepines: NOT DETECTED
Cocaine: NOT DETECTED
OPIATES: NOT DETECTED
TETRAHYDROCANNABINOL: NOT DETECTED

## 2016-08-31 MED ORDER — HALOPERIDOL 5 MG PO TABS
5.0000 mg | ORAL_TABLET | Freq: Two times a day (BID) | ORAL | Status: DC
  Administered 2016-08-31 (×2): 5 mg via ORAL
  Filled 2016-08-31 (×3): qty 1

## 2016-08-31 MED ORDER — ALBUTEROL SULFATE HFA 108 (90 BASE) MCG/ACT IN AERS: 2.0000 | INHALATION_SPRAY | RESPIRATORY_TRACT | Status: DC | PRN

## 2016-08-31 MED ORDER — AMLODIPINE BESYLATE 5 MG PO TABS
10.0000 mg | ORAL_TABLET | Freq: Every day | ORAL | Status: DC
  Administered 2016-08-31 – 2016-09-02 (×3): 10 mg via ORAL
  Filled 2016-08-31 (×3): qty 2

## 2016-08-31 MED ORDER — DOXEPIN HCL 100 MG PO CAPS
100.0000 mg | ORAL_CAPSULE | Freq: Every day | ORAL | Status: DC
  Administered 2016-08-31 – 2016-09-01 (×2): 100 mg via ORAL
  Filled 2016-08-31 (×2): qty 1

## 2016-08-31 MED ORDER — BENZTROPINE MESYLATE 1 MG PO TABS
1.0000 mg | ORAL_TABLET | Freq: Two times a day (BID) | ORAL | Status: DC
  Administered 2016-08-31 – 2016-09-02 (×5): 1 mg via ORAL
  Filled 2016-08-31 (×5): qty 1

## 2016-08-31 NOTE — Consult Note (Signed)
[place] [facility] Consult   Reason for Consult:  Stopped taking meds, brought to [facility], auditory halluciantions Referring Physician:  EDP Patient Identification: Covey Baller MRN:  [id number] Principal Diagnosis: Paranoid schizophrenia [facility]) Diagnosis:   Patient Active Problem List   Diagnosis Date Noted  . Paranoid schizophrenia ([place]) [F20.0] 04/20/2013  . Acute respiratory failure with hypoxia ([place]) [J96.01] 11/17/2012  . Aspiration pneumonia ([place]) [J69.0] 11/17/2012  . Encephalopathy acute [G93.40] 11/17/2012    Total Time spent with patient: 30 minutes  Subjective:   Klein Willcox is a 46 y.o. male patient admitted with auditory hallucinations.  HPI:  Johnothan Bascomb is an 46 y.o.who presented to [facility] after he was petitioned for involuntary commitment by Event organiser.  It was reported that patient was living at [facility] in [place].  When the retirement center was called, they reported that patient [place].  Patient would walk to the liquor store and drink all day.  They stated that patient is inappropriate to return to facility.  Pertinent background info is as follows:  Pt's medical record indicates he has a long history of schizophrenia. His sister, Larin Weissberg [phone] is his legal guardian and guardianship papers are in [place] was not taking his medications and began drinking alcohol so the staff at [facility] admitted Pt to [facility] in [place] to evaluate for alcohol abuse and resume medications. Per law enforcement, when Pt was discharged from [facility] their staff did not contact [facility] and, at [employer] request, they gave Pt a bus pass to visit his family in [place]. Pt reports he drank one 16-ounce beer and one shot of liquor today. When he arrived unannounced at his family's home they said he couldn't stay there. Pt says, "I was talking to myself and didn't know  what was going on." Family called law enforcement, who realized staff at [facility] filed a missing person report.   Patient was seen today.  He appeared calm and answered questions.  Patient states that he was hearing voices but it was because he was stressed out by family that turned him away."  He confirmed that he has a history of schizophrenia, denies non compliance with meds, denies substance abuse.  He reports that he has received Haldol IM and oral route and Cogentin.  Reports that he lives with his mother.    Past Psychiatric History: see HPI  Risk to Self: Suicidal Ideation: No Suicidal Intent: No Is patient at risk for suicide?: No Suicidal Plan?: No Access to Means: No What has been your use of drugs/alcohol within the last 12 months?: Pt reports drinking alcohol How many times?: 0 Other Self Harm Risks: None Triggers for Past Attempts: None known Intentional Self Injurious Behavior: None Risk to Others: Homicidal Ideation: No Thoughts of Harm to Others: No Current Homicidal Intent: No Current Homicidal Plan: No Access to Homicidal Means: No Identified Victim: None History of harm to others?: No Assessment of Violence: None Noted Violent Behavior Description: None Does patient have access to weapons?: No Criminal Charges Pending?: No Does patient have a court date: No Prior Inpatient Therapy: Prior Inpatient Therapy: Yes Prior Therapy Dates: 2014, multiple admits Prior Therapy Facilty/Provider(s): [facility], Dorita Fray Reason for Treatment: Schizophrenia Prior Outpatient Therapy: Prior Outpatient Therapy: Yes Prior Therapy Dates: Current Prior Therapy Facilty/Provider(s): Unknown provider in [place] Reason for Treatment: Schizophrenia Does patient have an ACCT team?: No Does patient have Intensive  In-House Services?  : No Does patient have [facility] services? : No Does patient have P4CC services?: No  Past Medical History:  Past Medical History:  Diagnosis  Date  . Hypertension   . Pneumonia    had aspiration pneumonia 5/14-on vent  . Schizophrenia [facility])     Past Surgical History:  Procedure Laterality Date  . arm surgery     hurt his arm 25 hr ago  . OPEN REDUCTION INTERNAL FIXATION (ORIF) PROXIMAL PHALANX Right 04/02/2013   Procedure: OPEN REDUCTION INTERNAL FIXATION (ORIF) RIGHT SMALL FINGER;  Surgeon: Tennis Must, MD;  Location: [facility];  Service: Orthopedics;  Laterality: Right;   Family History: History reviewed. No pertinent family history. Family Psychiatric  History: see HPi Social History:  History  Alcohol Use No    Comment: not now     History  Drug Use No    Comment: hx    Social History   Social History  . Marital status: Single    Spouse name: N/A  . Number of children: N/A  . Years of education: N/A   Social History Main Topics  . Smoking status: Current Every Day Smoker    Packs/day: 0.25    Types: Cigarettes  . Smokeless tobacco: None  . Alcohol use No     Comment: not now  . Drug use: No     Comment: hx  . Sexual activity: No   Other Topics Concern  . None   Social History Narrative  . None   Additional Social History:    Allergies:  No Known Allergies  Labs:  Results for orders placed or performed during the hospital encounter of 08/30/16 (from the past 48 hour(s))  CBC with Differential     Status: Abnormal   Collection Time: 08/30/16  9:30 PM  Result Value Ref Range   WBC 11.4 (H) 4.0 - 10.5 K/uL   RBC 5.05 4.22 - 5.81 MIL/uL   Hemoglobin 14.5 13.0 - 17.0 g/dL   HCT 42.9 39.0 - 52.0 %   MCV 85.0 78.0 - 100.0 fL   MCH 28.7 26.0 - 34.0 pg   MCHC 33.8 30.0 - 36.0 g/dL   RDW 14.0 11.5 - 15.5 %   Platelets 146 (L) 150 - 400 K/uL   Neutrophils Relative % 63 %   Neutro Abs 7.1 1.7 - 7.7 K/uL   Lymphocytes Relative 26 %   Lymphs Abs 2.9 0.7 - 4.0 K/uL   Monocytes Relative 10 %   Monocytes Absolute 1.2 (H) 0.1 - 1.0 K/uL   Eosinophils Relative 1 %   Eosinophils  Absolute 0.1 0.0 - 0.7 K/uL   Basophils Relative 0 %   Basophils Absolute 0.1 0.0 - 0.1 K/uL  Basic metabolic panel     Status: None   Collection Time: 08/30/16  9:30 PM  Result Value Ref Range   Sodium 137 135 - 145 mmol/L   Potassium 3.5 3.5 - 5.1 mmol/L   Chloride 104 101 - 111 mmol/L   CO2 26 22 - 32 mmol/L   Glucose, Bld 89 65 - 99 mg/dL   BUN 9 6 - 20 mg/dL   Creatinine, Ser 0.80 0.61 - 1.24 mg/dL   Calcium 9.1 8.9 - 10.3 mg/dL   GFR calc non Af Amer >60 >60 mL/min   GFR calc Af Amer >60 >60 mL/min    Comment: (NOTE) The eGFR has been calculated using the CKD EPI equation. This calculation has not been validated  in all clinical situations. eGFR's persistently <60 mL/min signify possible Chronic Kidney Disease.    Anion gap 7 5 - 15  Acetaminophen level     Status: Abnormal   Collection Time: 08/30/16  9:30 PM  Result Value Ref Range   Acetaminophen (Tylenol), Serum <10 (L) 10 - 30 ug/mL    Comment:        THERAPEUTIC CONCENTRATIONS VARY SIGNIFICANTLY. A RANGE OF 10-30 ug/mL MAY BE AN EFFECTIVE CONCENTRATION FOR MANY PATIENTS. HOWEVER, SOME ARE BEST TREATED AT CONCENTRATIONS OUTSIDE THIS RANGE. ACETAMINOPHEN CONCENTRATIONS >150 ug/mL AT 4 HOURS AFTER INGESTION AND >50 ug/mL AT 12 HOURS AFTER INGESTION ARE OFTEN ASSOCIATED WITH TOXIC REACTIONS.   Salicylate level     Status: None   Collection Time: 08/30/16  9:30 PM  Result Value Ref Range   Salicylate Lvl <9.6 2.8 - 30.0 mg/dL  Ethanol     Status: Abnormal   Collection Time: 08/30/16  9:30 PM  Result Value Ref Range   Alcohol, Ethyl (B) 5 (H) <5 mg/dL    Comment:        LOWEST DETECTABLE LIMIT FOR SERUM ALCOHOL IS 5 mg/dL FOR MEDICAL PURPOSES ONLY   Urine rapid drug screen (hosp performed)     Status: None   Collection Time: 08/31/16 11:25 AM  Result Value Ref Range   Opiates NONE DETECTED NONE DETECTED   Cocaine NONE DETECTED NONE DETECTED   Benzodiazepines NONE DETECTED NONE DETECTED   Amphetamines  NONE DETECTED NONE DETECTED   Tetrahydrocannabinol NONE DETECTED NONE DETECTED   Barbiturates NONE DETECTED NONE DETECTED    Comment:        DRUG SCREEN FOR MEDICAL PURPOSES ONLY.  IF CONFIRMATION IS NEEDED FOR ANY PURPOSE, NOTIFY LAB WITHIN 5 DAYS.        LOWEST DETECTABLE LIMITS FOR URINE DRUG SCREEN Drug Class       Cutoff (ng/mL) Amphetamine      1000 Barbiturate      200 Benzodiazepine   789 Tricyclics       381 Opiates          300 Cocaine          300 THC              50     Current Facility-Administered Medications  Medication Dose Route Frequency Provider Last Rate Last Dose  . albuterol (PROVENTIL HFA;VENTOLIN HFA) 108 (90 Base) MCG/ACT inhaler 2 puff  2 puff Inhalation Q4H PRN Lundon Rosier, MD      . amLODipine (NORVASC) tablet 10 mg  10 mg Oral Daily Porter Moes, MD   10 mg at 08/31/16 1149  . benztropine (COGENTIN) tablet 1 mg  1 mg Oral BID Corena Pilgrim, MD   1 mg at 08/31/16 1149  . doxepin (SINEQUAN) capsule 100 mg  100 mg Oral QHS Ala Capri, MD      . haloperidol (HALDOL) tablet 5 mg  5 mg Oral q12n4p Ming Mcmannis, MD   5 mg at 08/31/16 1149   Current Outpatient Prescriptions  Medication Sig Dispense Refill  . albuterol (PROVENTIL HFA;VENTOLIN HFA) 108 (90 Base) MCG/ACT inhaler Inhale 2 puffs into the lungs every 4 (four) hours as needed for wheezing or shortness of breath.    [name] Kitchen amLODipine (NORVASC) 10 MG tablet Take 10 mg by mouth daily.    . benztropine (COGENTIN) 1 MG tablet Take 1 mg by mouth 2 (two) times daily.    . Cholecalciferol (VITAMIN D3) 2000 units TABS Take 2,000  Units by mouth daily.    . clonazePAM (KLONOPIN) 0.5 MG tablet Take 0.5 mg by mouth 3 (three) times daily.     . clonazePAM (KLONOPIN) 1 MG tablet Take 1 mg by mouth every 6 (six) hours as needed for anxiety.    . divalproex (DEPAKOTE ER) 500 MG 24 hr tablet Take 500 mg by mouth every morning.    . divalproex (DEPAKOTE ER) 500 MG 24 hr tablet Take 1,500 mg by mouth at  bedtime.     [name] Kitchen doxepin (SINEQUAN) 100 MG capsule Take 100 mg by mouth at bedtime.    . haloperidol (HALDOL) 10 MG tablet Take 10 mg by mouth 2 (two) times daily.     . haloperidol (HALDOL) 5 MG tablet Take 5 mg by mouth 2 (two) times daily. Take 65m by mouth daily at 12 noon and 1600.    . haloperidol decanoate (HALDOL DECANOATE) 100 MG/ML injection Inject 200 mg into the muscle every 30 (thirty) days.     .[name] Kitchenibuprofen (ADVIL,MOTRIN) 800 MG tablet Take 800 mg by mouth 3 (three) times daily.    .[name] Kitchenlisinopril (PRINIVIL,ZESTRIL) 40 MG tablet Take 40 mg by mouth daily.    . QUEtiapine (SEROQUEL) 50 MG tablet Take 50 mg by mouth 3 (three) times daily.    . sertraline (ZOLOFT) 50 MG tablet Take 50 mg by mouth daily.    . sodium chloride 1 g tablet Take 1 g by mouth 2 (two) times daily.      Musculoskeletal: Strength & Muscle Tone: within normal limits Gait & Station: normal Patient leans: N/A  Psychiatric Specialty Exam: Physical Exam  Nursing note and vitals reviewed. Psychiatric: He has a normal mood and affect. His speech is normal and behavior is normal. Judgment and thought content normal. Cognition and memory are normal.    Review of Systems  All other systems reviewed and are negative.   Blood pressure 150/88, pulse 73, temperature 98.7 F (37.1 C), temperature source Oral, resp. rate 18, SpO2 94 %.There is no height or weight on file to calculate BMI.  General Appearance: Disheveled  Eye Contact:  Fair  Speech:  Normal Rate  Volume:  Normal  Mood:  Anxious  Affect:  Appropriate and Congruent  Thought Process:  Coherent  Orientation:  Full (Time, Place, and Person)  Thought Content:  Logical  Suicidal Thoughts:  No  Homicidal Thoughts:  No  Memory:  Immediate;   Fair Recent;   Fair Remote;   Fair  Judgement:  Fair  Insight:  Fair  Psychomotor Activity:  Normal  Concentration:  Concentration: Fair and Attention Span: Fair  Recall:  [employer] Knowledge:  Fair   Language:  Fair  Akathisia:  No  Handed:  Right  AIMS (if indicated):     Assets:  Resilience  ADL's:  Intact  Cognition:  WNL  Sleep:      Treatment Plan Summary: Daily contact with patient to assess and evaluate symptoms and progress in treatment, Medication management and Plan psych cleared.  CSW is working on a plan to discharge patient safely as [facility]  Patient has family locally.  Disposition: No evidence of imminent risk to self or others at present.   Patient does not meet criteria for psychiatric inpatient admission. Supportive therapy provided about ongoing stressors. Discussed crisis plan, support from social network, calling 911, coming to the Emergency Department, and calling Suicide Hotline.  SFreda MunroMay  Artemus, [place] 08/31/2016 3:56 PM  Patient seen face-to-face for psychiatric evaluation, chart reviewed and case discussed with the physician extender and developed treatment plan. Reviewed the information documented and agree with the treatment plan. Corena Pilgrim, MD

## 2016-08-31 NOTE — ED Notes (Signed)
Pt given breakfast tray and made aware that we need a urine sample.  

## 2016-08-31 NOTE — Progress Notes (Signed)
CSW received TC from [name] [name] from pt's [employer]([facility]) ACT Team.  CSW informed Ms. [name] that pt has been psychiatrically cleared for d/c and has no place to go.  Ms. [name] confirmed that [facility] is working to determine the legality of pt's d/c from his former [facility]. Patient sister/guardian [name] [name]([name] [name]) in [place] cannot manage patient and will not accept him into her home. Ms. [name]'s office number, which is also the number for pt's other ACT Team members is [phone].  CSW will continue to follow for, and assist with, pt's disposition.  [name], LCSW ED/Evening Coverage [phone]

## 2016-08-31 NOTE — BH Assessment (Signed)
Merri RayLaeesha Swepson, RN called from [facility] located in [place] [phone] sts that they are looking for placement/housing for this patient.

## 2016-08-31 NOTE — BH Assessment (Signed)
[name] SackKendra [name]/Legal Guardian   [phone] (incorrect #).    [phone] (correct #)

## 2016-08-31 NOTE — BH Assessment (Signed)
Per Diona FantiLeslie Jones Ambulance person(Assistant Director) with [facility] in [place], [place] called and re-iterated that patient is not return back to the facility. Sts that patient is not appropriate for the milieu due to recent behaviors.

## 2016-08-31 NOTE — ED Notes (Signed)
Case management attempting to reach patients family.

## 2016-08-31 NOTE — ED Notes (Signed)
Provided patient two sticks of cheese.

## 2016-08-31 NOTE — BH Assessment (Signed)
Patient evaluated by providers, Dr. Jannifer FranklinAkintayo and May, NP on this day. Patient is psychiatrically cleared and ready for discharge. Patient to discharge to the care of reported legal guardian/Kendra Levete-Dezeeuw [phone]. Writer contacted guardian but no answer. The guardian's phone rings several times and then will automatically disconnect. Writer unable to leave a voicemail.   Writer contacted patient's residence [facility] in [place], [place] and spoke to Black & Deckermanger Witner Wright [phone]. Per [employer], patient is not appropriate to return back to the facility. Sts that patent has lived at the facility x1 week. Per [employer]itner, his behavior over the course of 1 week consist of "walking the streets and drinking". Witner sts that he will contact [facility] to see if they will find patient another facility to live.   Patient referred to LCSW to follow up with discharge.

## 2016-08-31 NOTE — Progress Notes (Signed)
CSW followed up with Wittner Delford FieldWright with [facility]. Mr. Delford FieldWright stated patient is unable to return to facility and has completed immediate discharge paperwork due to patient "running away". CSW requested immediate discharge paperwork to keep on file. Patient has legal guardian, [name] at [phone], regarding plans for DC. Legal guardian stated the facility was responsible for patient and she would not be picking up patient. Legal guardian "nobody in the family wants him. I cant pick him up". Legal guardian also stated patient had been transported via bus from Facility. Legal Guardian requested number for director CSW will provide office number for director.   CSW updated Chiropodistassistant director who stated a report needed to be made due to facility sending patient on a bus. CSW contacted Mr. Delford FieldWright to verify who provided patient with bus pass.   CSW contacted Mr. Delford FieldWright to verify how patient got to [place]. Per Mr. Delford FieldWright, [facility]. Patient was discharged from ED and given bus pass. Facility called to check on patient and filed missing persons report. Police found patient in [place] and [facility]   CSW will update PM CSW and Chiropodistassistant director regarding situation.   Stacy GardnerErin Pleas Carneal, LCSWA Clinical Social Worker [phone]

## 2016-09-01 MED ORDER — DIPHENHYDRAMINE HCL 25 MG PO CAPS
50.0000 mg | ORAL_CAPSULE | Freq: Once | ORAL | Status: AC
  Administered 2016-09-01: 50 mg via ORAL
  Filled 2016-09-01: qty 2

## 2016-09-01 MED ORDER — LORAZEPAM 1 MG PO TABS
2.0000 mg | ORAL_TABLET | Freq: Once | ORAL | Status: AC
  Administered 2016-09-01: 2 mg via ORAL
  Filled 2016-09-01: qty 2

## 2016-09-01 MED ORDER — CARBAMAZEPINE 200 MG PO TABS
200.0000 mg | ORAL_TABLET | Freq: Two times a day (BID) | ORAL | Status: DC
  Administered 2016-09-01 – 2016-09-02 (×2): 200 mg via ORAL
  Filled 2016-09-01 (×2): qty 1

## 2016-09-01 MED ORDER — STERILE WATER FOR INJECTION IJ SOLN
INTRAMUSCULAR | Status: AC
  Filled 2016-09-01: qty 10

## 2016-09-01 MED ORDER — ASENAPINE MALEATE 5 MG SL SUBL
10.0000 mg | SUBLINGUAL_TABLET | Freq: Two times a day (BID) | SUBLINGUAL | Status: DC
  Administered 2016-09-01 – 2016-09-02 (×3): 10 mg via SUBLINGUAL
  Filled 2016-09-01 (×3): qty 2

## 2016-09-01 MED ORDER — DIPHENHYDRAMINE HCL 50 MG/ML IJ SOLN
50.0000 mg | Freq: Once | INTRAMUSCULAR | Status: DC
  Filled 2016-09-01: qty 1

## 2016-09-01 MED ORDER — ZIPRASIDONE MESYLATE 20 MG IM SOLR
20.0000 mg | Freq: Once | INTRAMUSCULAR | Status: DC
  Filled 2016-09-01: qty 20

## 2016-09-01 MED ORDER — RISPERIDONE 2 MG PO TABS
2.0000 mg | ORAL_TABLET | Freq: Two times a day (BID) | ORAL | Status: DC
  Administered 2016-09-01: 2 mg via ORAL
  Filled 2016-09-01: qty 1

## 2016-09-01 MED ORDER — OLANZAPINE 10 MG PO TBDP
10.0000 mg | ORAL_TABLET | Freq: Once | ORAL | Status: AC | PRN
  Administered 2016-09-01: 10 mg via ORAL
  Filled 2016-09-01: qty 1

## 2016-09-01 MED ORDER — LORAZEPAM 2 MG/ML IJ SOLN
2.0000 mg | Freq: Once | INTRAMUSCULAR | Status: DC
  Filled 2016-09-01: qty 1

## 2016-09-01 NOTE — ED Notes (Signed)
[employer] and security on standby outside patient's room.

## 2016-09-01 NOTE — ED Notes (Signed)
Nurse practitioner notified of patient's behavior.

## 2016-09-01 NOTE — ED Notes (Signed)
Patient remains calm, alert and oriented.  Ate dinner, now is resting quietly.

## 2016-09-01 NOTE — ED Notes (Signed)
Patient ate lunch and is now resting quietly on his bed.  VSS.

## 2016-09-01 NOTE — Progress Notes (Signed)
CSW received call back from [facility] stating they are unable to take patient because patient does not have a diagnosis of IDD/MR. Lesle Chriserek McDowell stated they could consider patient if they had a "state funded [address] coordinator". CSW was provided with phone number and will call [facility].   Lesle Chriserek McDowell provided number for [facility]: [phone].  Stacy GardnerErin Standley Bargo, LCSWA Clinical Social Worker [phone]

## 2016-09-01 NOTE — Progress Notes (Signed)
CSW will follow patient for placement.   CSW has contacted [facility] to see if they are able to meet needs. Waiting for return call.   Stacy GardnerErin Kayron [name], LCSWA Clinical Social Worker [phone]

## 2016-09-01 NOTE — Progress Notes (Signed)
CSW contacted Larence PenningMegan Lanphere regarding inappropriate immediate discharge. CSW left voicemail for return call.   Stacy GardnerErin Danita Proud, LCSWA Clinical Social Worker [phone]

## 2016-09-01 NOTE — Progress Notes (Signed)
CSW contacted legal guardian, [name], regarding current discharge plans. Patient currently would like to DC to the homeless shelter; however Ms. [name] did NOT agreeable to DC to homeless shelter. CSW updated Ms. [name] regarding placement and other options. Ms. [name] stated she understood and requested directors number. CSW provided assistant directors office number.   CSW received call back from [place], with [facility]: [phone], who stated they are currently looking for placement in the [place] area and CSW will continue to look for placement in the [place] area. Ms. Minus LibertyLeasha also stated patient is due for his Haldol injection of 200MG , CSW will update NP.    Stacy GardnerErin Wilbur Labuda, LCSWA Clinical Social Worker [phone]
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
## 2016-09-01 NOTE — Progress Notes (Addendum)
CSW sent initial referrals and FL-2's to 27 more ALF/FCH's via the hub, that weren't previously sent referrals, on 2/22.  CSW called [facility] director Wyvonnia LoraKelly Bennett at ph: [phone], [phone] and [phone] and left a message for Wyvonnia LoraKelly Bennett, owner/operator.  CSW sent a FL-2 to [facility] on 09/01/16 and the previous ED CSW sent an initial referral to [facility] also, on 09/01/16.  Jan at [facility] [place] will arrive at the ED on the morning of 09/02/16 and will call [phone] when they arrive at the ED to talk to the pt about placement.   In addition, CSW sent referrals and FL-2's out to all area group homes that are on the hub.  CSW will continue to follow.  6:37 PM Pt has a legal guardian.  Guardian is the pt's sister Wendelyn BreslowKendra Erno at [address], [place], [place] [address]. Pt will place a copy of guardianship papers in pt's chart.  A copy of the pt's [facility] will also be placed in the pt's chart. Pt is currently unaware that Jan from [facility] will visit the pt on the morning of 09/01/16.   Dorothe PeaJonathan F. Nahuel Wilbert, Theresia MajorsLCSWA, LCAS Clinical Social Worker Ph: [phone]

## 2016-09-01 NOTE — Progress Notes (Addendum)
CSW contacted Kennieth RadWittner Wright regarding immediate discharge paperwork. Mr. Delford FieldWright provided CSW with contact information for Dot LanesDonna Ducktins- [phone]. Per Mr. Delford FieldWright, patients legal guardian stated "i dont want him any where near [place] so I dont have to deal with him".   CSW contacted Dot LanesDonna Ducktins with no answer. CSW left voicemail for return call.  CSW will contact [employer] Ducktins.   Stacy GardnerErin Lekha Dancer, LCSWA Clinical Social Worker [phone]

## 2016-09-01 NOTE — ED Notes (Signed)
Patient became upset and aggressive when he asked to leave unit and RN had to tell him he was IVC'd.  Patient wanted to go home to his mother, but social work had told nurse that his family did not want him to come home.  Medications ordered initially IM, but upon talking with the patient police were able to talk patient into taking medications by mouth.  MD notified and PO meds were ordered for patient who then took them with no problem.

## 2016-09-01 NOTE — ED Notes (Signed)
Patient staying in room but still being abusive verbally and asking to leave.

## 2016-09-02 MED ORDER — CARBAMAZEPINE 200 MG PO TABS: 200.0000 mg | ORAL_TABLET | Freq: Two times a day (BID) | ORAL | 0 refills | Status: DC

## 2016-09-02 MED ORDER — AMLODIPINE BESYLATE 10 MG PO TABS: 10.0000 mg | ORAL_TABLET | Freq: Every day | ORAL | 0 refills | Status: DC

## 2016-09-02 MED ORDER — DIPHENHYDRAMINE HCL 50 MG/ML IJ SOLN
50.0000 mg | Freq: Once | INTRAMUSCULAR | Status: AC
  Administered 2016-09-02: 50 mg via INTRAMUSCULAR
  Filled 2016-09-02: qty 1

## 2016-09-02 MED ORDER — ZIPRASIDONE MESYLATE 20 MG IM SOLR
20.0000 mg | Freq: Once | INTRAMUSCULAR | Status: AC
  Administered 2016-09-02: 20 mg via INTRAMUSCULAR
  Filled 2016-09-02: qty 20

## 2016-09-02 MED ORDER — STERILE WATER FOR INJECTION IJ SOLN
INTRAMUSCULAR | Status: AC
  Filled 2016-09-02: qty 10

## 2016-09-02 MED ORDER — LORAZEPAM 2 MG/ML IJ SOLN
2.0000 mg | Freq: Once | INTRAMUSCULAR | Status: AC
  Administered 2016-09-02: 2 mg via INTRAMUSCULAR
  Filled 2016-09-02: qty 1

## 2016-09-02 NOTE — Discharge Instructions (Signed)
Take your tegretol and norvasc and prescribed,.   See your psychiatrist and counselor and primary care doctor.   Avoid drinking alcohol.  Return to ER if you have thoughts of harming yourself or others, worse hallucinations,

## 2016-09-02 NOTE — ED Notes (Signed)
ED Provider at bedside. 

## 2016-09-02 NOTE — ED Notes (Signed)
[employer] at bedside, along with security; patient given meds due to aggressive behavior towards staff; patient yelling, cussing and threatening staff and punching the glass on door

## 2016-09-02 NOTE — ED Notes (Signed)
Patient took a shower and has clean linen provided

## 2016-09-02 NOTE — Progress Notes (Signed)
Patient frustrated and wanted to leave.  He became very agitated but calmed enough to take oral PRN medications.  However, he did escalate shortly, medication changes made. Remains social work placement.  Nanine MeansJamison Harlan Ervine, PMH-NP

## 2016-09-02 NOTE — ED Notes (Signed)
Pt attempted to contact sister, but no answer; pt is responding to internal stimuli

## 2016-09-02 NOTE — ED Provider Notes (Signed)
  Physical Exam  BP 135/84 (BP Location: Left Arm)   Pulse 83   Temp 98.9 F (37.2 C) (Oral)   Resp 18   SpO2 98%   Physical Exam  ED Course  Procedures  MDM Patient was seen by psych 2 days ago and was cleared by psych. Social work saw patient and patient refused placement. Wants to go to a shelter. He is under IVC. I talked to him after he was given geodon and ativan. He is alert and oriented. Denies thoughts to harm himself or others. He just wants to go to a shelter. Will refill psych meds. Will have him follow up with psychiatrist, medical doctor.      Charlynne Panderavid Hsienta Yao, MD 09/02/16 1254

## 2016-09-28 ENCOUNTER — Emergency Department (HOSPITAL_COMMUNITY)
Admission: EM | Admit: 2016-09-28 | Discharge: 2016-09-29 | Disposition: A | Discharge status: Home / Self Care | Payer: Medicare Other | Attending: Emergency Medicine | Admitting: Emergency Medicine

## 2016-09-28 ENCOUNTER — Encounter (HOSPITAL_COMMUNITY): Payer: Self-pay | Admitting: Emergency Medicine

## 2016-09-28 DIAGNOSIS — K2920 Alcoholic gastritis without bleeding: Secondary | ICD-10-CM | POA: Insufficient documentation

## 2016-09-28 DIAGNOSIS — R1033 Periumbilical pain: Secondary | ICD-10-CM | POA: Diagnosis present

## 2016-09-28 DIAGNOSIS — Z79899 Other long term (current) drug therapy: Secondary | ICD-10-CM | POA: Diagnosis not present

## 2016-09-28 DIAGNOSIS — I10 Essential (primary) hypertension: Secondary | ICD-10-CM | POA: Diagnosis not present

## 2016-09-28 DIAGNOSIS — F17210 Nicotine dependence, cigarettes, uncomplicated: Secondary | ICD-10-CM | POA: Diagnosis not present

## 2016-09-28 HISTORY — DX: Bipolar disorder, unspecified: F31.9

## 2016-09-28 LAB — COMPREHENSIVE METABOLIC PANEL
ALT: 14 U/L — ABNORMAL LOW (ref 17–63)
AST: 23 U/L (ref 15–41)
Albumin: 3.8 g/dL (ref 3.5–5.0)
Alkaline Phosphatase: 69 U/L (ref 38–126)
Anion gap: 7 (ref 5–15)
BILIRUBIN TOTAL: 0.4 mg/dL (ref 0.3–1.2)
BUN: 14 mg/dL (ref 6–20)
CHLORIDE: 99 mmol/L — AB (ref 101–111)
CO2: 27 mmol/L (ref 22–32)
Calcium: 9 mg/dL (ref 8.9–10.3)
Creatinine, Ser: 0.84 mg/dL (ref 0.61–1.24)
Glucose, Bld: 107 mg/dL — ABNORMAL HIGH (ref 65–99)
POTASSIUM: 4 mmol/L (ref 3.5–5.1)
Sodium: 133 mmol/L — ABNORMAL LOW (ref 135–145)
TOTAL PROTEIN: 7.2 g/dL (ref 6.5–8.1)

## 2016-09-28 LAB — CBC
HEMATOCRIT: 43.1 % (ref 39.0–52.0)
Hemoglobin: 14.2 g/dL (ref 13.0–17.0)
MCH: 28.5 pg (ref 26.0–34.0)
MCHC: 32.9 g/dL (ref 30.0–36.0)
MCV: 86.4 fL (ref 78.0–100.0)
PLATELETS: 212 10*3/uL (ref 150–400)
RBC: 4.99 MIL/uL (ref 4.22–5.81)
RDW: 14.2 % (ref 11.5–15.5)
WBC: 10.9 10*3/uL — AB (ref 4.0–10.5)

## 2016-09-28 LAB — LIPASE, BLOOD: LIPASE: 34 U/L (ref 11–51)

## 2016-09-28 MED ORDER — PANTOPRAZOLE SODIUM 20 MG PO TBEC: 20.0000 mg | DELAYED_RELEASE_TABLET | Freq: Every day | ORAL | 0 refills | Status: DC

## 2016-09-28 MED ORDER — GI COCKTAIL ~~LOC~~
30.0000 mL | Freq: Once | ORAL | Status: AC
  Administered 2016-09-28: 30 mL via ORAL
  Filled 2016-09-28: qty 30

## 2016-09-28 MED ORDER — PANTOPRAZOLE SODIUM 40 MG PO TBEC
40.0000 mg | DELAYED_RELEASE_TABLET | Freq: Once | ORAL | Status: AC
  Administered 2016-09-28: 40 mg via ORAL
  Filled 2016-09-28: qty 1

## 2016-09-28 NOTE — ED Triage Notes (Signed)
Pt presents from [facility] after having esophageal and abdominal pain. Nausea. States it feels like his ulcer is worse because he drank some beer today. Alert and oriented.

## 2016-09-28 NOTE — ED Triage Notes (Signed)
Pt states he is here for abdominal pain from him drinking too much beer. States he thinks he got a stomach bug from it.  Drinks 3-4 a day. Endorses nausea and vomiting. Hard to understand.

## 2016-09-28 NOTE — ED Notes (Signed)
Pt called for room placement x1 with no answer

## 2016-09-28 NOTE — ED Provider Notes (Signed)
[facility]-EMERGENCY DEPT Provider Note   CSN: [id number] Arrival date & time: 09/28/16  2059  By signing my name below, I, [name] [name], attest that this documentation has been prepared under the direction and in the presence of [employer] Dillie Burandt, PA-C.  Electronically Signed: Rosario AdieWilliam Andrew [name], ED Scribe. 09/28/16. 10:32 PM.   History   Chief Complaint Chief Complaint  Patient presents with  . Abdominal Pain    The history is provided by the patient. No language interpreter was used.    HPI Comments: [name] [name] is a 46 y.o. male with a h/o HTN, bipolar depression, and paranoid schizophrenia, who presents to the Emergency Department complaining of periumbilical abdominal pain beginning earlier in the evening. Per pt, has been drinking and unspecified amount of beer over the course of the day prior to the onset of his pain this evening. He notes that he typically drinks 3-4 12oz beers a day on average. Pt reports associated nausea and abdominal distension secondary the onset of his abdominal pain as well. Pt states that he has a h/o peptic ulcers and his pain today is similar to this. He does not take medications daily to control this, and no noted treatments for his pain were tried prior to coming into the ED. No PSHx to the abdomen. Pt denies vomiting, fever, or any other associated symptoms.    Past Medical History:  Diagnosis Date  . Bipolar depression (HCC)   . Hypertension   . Pneumonia    had aspiration pneumonia 5/14-on vent  . Schizophrenia [facility](HCC)    Patient Active Problem List   Diagnosis Date Noted  . Paranoid schizophrenia (HCC) 04/20/2013  . Acute respiratory failure with hypoxia (HCC) 11/17/2012  . Aspiration pneumonia (HCC) 11/17/2012  . Encephalopathy acute 11/17/2012   Past Surgical History:  Procedure Laterality Date  . arm surgery     hurt his arm 25 hr ago  . OPEN REDUCTION INTERNAL FIXATION (ORIF) PROXIMAL PHALANX Right 04/02/2013   Procedure: OPEN  REDUCTION INTERNAL FIXATION (ORIF) RIGHT SMALL FINGER;  Surgeon: Tami RibasKevin R Kuzma, MD;  Location: [facility];  Service: Orthopedics;  Laterality: Right;    Home Medications    Prior to Admission medications   Medication Sig Start Date End Date Taking? Authorizing Provider  albuterol (PROVENTIL HFA;VENTOLIN HFA) 108 (90 Base) MCG/ACT inhaler Inhale 2 puffs into the lungs every 4 (four) hours as needed for wheezing or shortness of breath.   Yes Historical Provider, MD  amLODipine (NORVASC) 10 MG tablet Take 1 tablet (10 mg total) by mouth daily. 09/02/16  Yes Charlynne Panderavid Hsienta Yao, MD  benztropine (COGENTIN) 1 MG tablet Take 1 mg by mouth 2 (two) times daily.   Yes Historical Provider, MD  carbamazepine (TEGRETOL) 200 MG tablet Take 1 tablet (200 mg total) by mouth 2 (two) times daily. 09/02/16  Yes Charlynne Panderavid Hsienta Yao, MD  Cholecalciferol (VITAMIN D3) 2000 units TABS Take 2,000 Units by mouth daily.   Yes Historical Provider, MD  clonazePAM (KLONOPIN) 0.5 MG tablet Take 0.5 mg by mouth 3 (three) times daily.    Yes Historical Provider, MD  clonazePAM (KLONOPIN) 1 MG tablet Take 1 mg by mouth every 6 (six) hours as needed for anxiety.   Yes Historical Provider, MD  divalproex (DEPAKOTE ER) 500 MG 24 hr tablet Take 500 mg by mouth every morning.   Yes Historical Provider, MD  divalproex (DEPAKOTE ER) 500 MG 24 hr tablet Take 1,500 mg by mouth at bedtime.  Yes Historical Provider, MD  doxepin (SINEQUAN) 100 MG capsule Take 100 mg by mouth at bedtime.   Yes Historical Provider, MD  haloperidol (HALDOL) 10 MG tablet Take 10 mg by mouth 2 (two) times daily.    Yes Historical Provider, MD  haloperidol (HALDOL) 5 MG tablet Take 5 mg by mouth 2 (two) times daily. Take 5mg  by mouth daily at 12 noon and 1600.   Yes Historical Provider, MD  haloperidol decanoate (HALDOL DECANOATE) 100 MG/ML injection Inject 200 mg into the muscle every 30 (thirty) days.    Yes Historical Provider, MD  ibuprofen  (ADVIL,MOTRIN) 800 MG tablet Take 800 mg by mouth 3 (three) times daily.   Yes Historical Provider, MD  lisinopril (PRINIVIL,ZESTRIL) 40 MG tablet Take 40 mg by mouth daily.   Yes Historical Provider, MD  QUEtiapine (SEROQUEL) 50 MG tablet Take 50 mg by mouth 3 (three) times daily.   Yes Historical Provider, MD  sertraline (ZOLOFT) 50 MG tablet Take 50 mg by mouth daily.   Yes Historical Provider, MD  sodium chloride 1 g tablet Take 1 g by mouth 2 (two) times daily.   Yes Historical Provider, MD  pantoprazole (PROTONIX) 20 MG tablet Take 1 tablet (20 mg total) by mouth daily. 09/28/16   Antony Madura, PA-C   Family History No family history on file.  Social History Social History  Substance Use Topics  . Smoking status: Current Every Day Smoker    Packs/day: 0.50    Types: Cigarettes  . Smokeless tobacco: Never Used  . Alcohol use Yes   Allergies   Tomato  Review of Systems Review of Systems  Constitutional: Negative for fever.  Gastrointestinal: Positive for abdominal pain and nausea. Negative for vomiting.  A complete 10 system review of systems was obtained and all systems are negative except as noted in the HPI and PMH.    Physical Exam Updated Vital Signs BP (!) 141/70 (BP Location: Left Arm) Comment: MAP 87  Pulse 72   Temp 98.3 F (36.8 C) (Oral)   Resp 20   Ht 5\' 8"  (1.727 m)   Wt 120.2 kg   SpO2 99%   BMI 40.29 kg/m   Physical Exam  Constitutional: He is oriented to person, place, and time. He appears well-developed and well-nourished. No distress.  Nontoxic and in no acute distress  HENT:  Head: Normocephalic and atraumatic.  Eyes: Conjunctivae and EOM are normal. No scleral icterus.  Neck: Normal range of motion.  Cardiovascular: Normal rate, regular rhythm and intact distal pulses.   Pulmonary/Chest: Effort normal. No respiratory distress. He has no wheezes.  Respirations even and unlabored  Musculoskeletal: Normal range of motion.  Neurological: He is  alert and oriented to person, place, and time. He exhibits normal muscle tone. Coordination normal.  Patient ambulatory, independently, with steady gait.  Skin: Skin is warm and dry. No rash noted. He is not diaphoretic. No erythema. No pallor.  Psychiatric: He has a normal mood and affect. His behavior is normal.  Nursing note and vitals reviewed.   ED Treatments / Results  COORDINATION OF CARE: 10:30 PM-Discussed next steps with pt. Pt verbalized understanding and is agreeable with the plan.   Labs (all labs ordered are listed, but only abnormal results are displayed) Labs Reviewed  COMPREHENSIVE METABOLIC PANEL - Abnormal; Notable for the following:       Result Value   Sodium 133 (*)    Chloride 99 (*)    Glucose, Bld 107 (*)  ALT 14 (*)    All other components within normal limits  CBC - Abnormal; Notable for the following:    WBC 10.9 (*)    All other components within normal limits  LIPASE, BLOOD   EKG  EKG Interpretation None      Radiology No results found.  Procedures Procedures   Medications Ordered in ED Medications  gi cocktail (Maalox,Lidocaine,Donnatal) (30 mLs Oral Given 09/28/16 2252)  pantoprazole (PROTONIX) EC tablet 40 mg (40 mg Oral Given 09/28/16 2252)    Initial Impression / Assessment and Plan / ED Course  I have reviewed the triage vital signs and the nursing notes.  Pertinent labs & imaging results that were available during my care of the patient were reviewed by me and considered in my medical decision making (see chart for details).     46 year old male presents to the emergency department for complaints of left upper quadrant and epigastric abdominal pain. Patient has been drinking multiple beers today. He reports a history of peptic ulcer disease. He has no reproducible tenderness on exam. Abdomen is soft, obese. Laboratory workup is reassuring. Suspect pain to be secondary to alcoholic gastritis. Patient given GI cocktail. Will  discharge with prescription for Protonix. Return precautions provided at discharge. Patient discharged in stable condition with no unaddressed concerns.   Final Clinical Impressions(s) / ED Diagnoses   Final diagnoses:  Alcoholic gastritis, presence of bleeding unspecified, unspecified chronicity   New Prescriptions Discharge Medication List as of 09/28/2016 11:29 PM    START taking these medications   Details  pantoprazole (PROTONIX) 20 MG tablet Take 1 tablet (20 mg total) by mouth daily., Starting Wed 09/28/2016, Print       I personally performed the services described in this documentation, which was scribed in my presence. The recorded information has been reviewed and is accurate.       Antony Madura, PA-C 09/29/16 0231    Nira Conn, MD 09/30/16 [phone]

## 2016-11-24 ENCOUNTER — Encounter (HOSPITAL_COMMUNITY): Payer: Self-pay | Admitting: Emergency Medicine

## 2016-11-24 ENCOUNTER — Emergency Department (HOSPITAL_COMMUNITY)
Admission: EM | Admit: 2016-11-24 | Discharge: 2016-11-26 | Disposition: A | Discharge status: Home / Self Care | Payer: Medicare Other | Attending: Emergency Medicine | Admitting: Emergency Medicine

## 2016-11-24 DIAGNOSIS — R443 Hallucinations, unspecified: Secondary | ICD-10-CM | POA: Insufficient documentation

## 2016-11-24 DIAGNOSIS — Z79899 Other long term (current) drug therapy: Secondary | ICD-10-CM | POA: Diagnosis not present

## 2016-11-24 DIAGNOSIS — F17210 Nicotine dependence, cigarettes, uncomplicated: Secondary | ICD-10-CM | POA: Diagnosis not present

## 2016-11-24 DIAGNOSIS — I10 Essential (primary) hypertension: Secondary | ICD-10-CM | POA: Diagnosis not present

## 2016-11-24 LAB — CBC
HCT: 43 % (ref 39.0–52.0)
Hemoglobin: 14.1 g/dL (ref 13.0–17.0)
MCH: 27.6 pg (ref 26.0–34.0)
MCHC: 32.8 g/dL (ref 30.0–36.0)
MCV: 84.1 fL (ref 78.0–100.0)
PLATELETS: 170 10*3/uL (ref 150–400)
RBC: 5.11 MIL/uL (ref 4.22–5.81)
RDW: 13.7 % (ref 11.5–15.5)
WBC: 12.4 10*3/uL — AB (ref 4.0–10.5)

## 2016-11-24 LAB — COMPREHENSIVE METABOLIC PANEL
ALK PHOS: 77 U/L (ref 38–126)
ALT: 11 U/L — AB (ref 17–63)
AST: 19 U/L (ref 15–41)
Albumin: 3.6 g/dL (ref 3.5–5.0)
Anion gap: 10 (ref 5–15)
BUN: 10 mg/dL (ref 6–20)
CALCIUM: 9 mg/dL (ref 8.9–10.3)
CO2: 21 mmol/L — AB (ref 22–32)
CREATININE: 1.02 mg/dL (ref 0.61–1.24)
Chloride: 106 mmol/L (ref 101–111)
Glucose, Bld: 115 mg/dL — ABNORMAL HIGH (ref 65–99)
Potassium: 3.5 mmol/L (ref 3.5–5.1)
Sodium: 137 mmol/L (ref 135–145)
Total Bilirubin: 0.4 mg/dL (ref 0.3–1.2)
Total Protein: 6.8 g/dL (ref 6.5–8.1)

## 2016-11-24 LAB — ETHANOL

## 2016-11-24 NOTE — ED Triage Notes (Signed)
Pt picked by [facility] at [facility] where patient called out for "head pain".  Pt c/o 8/10 headache, states "the voices are making it hurt".  Pt w/ a hx of schizophrenia.  States he has not been taking his medicine "since I got out of jail" which was approx 2 weeks ago.  Pt denied SI/HI, denies command hallucinations.  States voices "are just talking to me".  Patient calm and cooperative in triage.

## 2016-11-24 NOTE — ED Provider Notes (Signed)
[facility]-EMERGENCY DEPT Provider Note   CSN: [id number] Arrival date & time: 11/24/16  2140     History   Chief Complaint Chief Complaint  Patient presents with  . Hallucinations    HPI [name] [name] is a 46 y.o. male.  Patient with past medical history remarkable for schizophrenia, bipolar, and hypertension presents to the emergency department with chief complaint of hallucinations. He states that he recently got released from jail. He reports that he has had visual and auditory hallucinations which causes his head to hurt.  He comes from [facility] in [place].  He denies any SI/HI.  He denies any drug or alcohol use.  He states that he needs to have his medications evaluated and somewhere to stay.   The history is provided by the patient. No language interpreter was used.    Past Medical History:  Diagnosis Date  . Bipolar depression (HCC)   . Hypertension   . Pneumonia    had aspiration pneumonia 5/14-on vent  . Schizophrenia [facility])     Patient Active Problem List   Diagnosis Date Noted  . Paranoid schizophrenia (HCC) 04/20/2013  . Acute respiratory failure with hypoxia (HCC) 11/17/2012  . Aspiration pneumonia (HCC) 11/17/2012  . Encephalopathy acute 11/17/2012    Past Surgical History:  Procedure Laterality Date  . arm surgery     hurt his arm 25 hr ago  . OPEN REDUCTION INTERNAL FIXATION (ORIF) PROXIMAL PHALANX Right 04/02/2013   Procedure: OPEN REDUCTION INTERNAL FIXATION (ORIF) RIGHT SMALL FINGER;  Surgeon: Tami Ribas, MD;  Location: [facility];  Service: Orthopedics;  Laterality: Right;       Home Medications    Prior to Admission medications   Medication Sig Start Date End Date Taking? Authorizing Provider  albuterol (PROVENTIL HFA;VENTOLIN HFA) 108 (90 Base) MCG/ACT inhaler Inhale 2 puffs into the lungs every 4 (four) hours as needed for wheezing or shortness of breath.    [provider]  amLODipine (NORVASC) 10  MG tablet Take 1 tablet (10 mg total) by mouth daily. 09/02/16   Charlynne Pander, MD  benztropine (COGENTIN) 1 MG tablet Take 1 mg by mouth 2 (two) times daily.    [provider]  carbamazepine (TEGRETOL) 200 MG tablet Take 1 tablet (200 mg total) by mouth 2 (two) times daily. 09/02/16   Charlynne Pander, MD  Cholecalciferol (VITAMIN D3) 2000 units TABS Take 2,000 Units by mouth daily.    [provider]  clonazePAM (KLONOPIN) 0.5 MG tablet Take 0.5 mg by mouth 3 (three) times daily.     [provider]  clonazePAM (KLONOPIN) 1 MG tablet Take 1 mg by mouth every 6 (six) hours as needed for anxiety.    [provider]  divalproex (DEPAKOTE ER) 500 MG 24 hr tablet Take 500 mg by mouth every morning.    [provider]  divalproex (DEPAKOTE ER) 500 MG 24 hr tablet Take 1,500 mg by mouth at bedtime.     [provider]  doxepin (SINEQUAN) 100 MG capsule Take 100 mg by mouth at bedtime.    [provider]  haloperidol (HALDOL) 10 MG tablet Take 10 mg by mouth 2 (two) times daily.     [provider]  haloperidol (HALDOL) 5 MG tablet Take 5 mg by mouth 2 (two) times daily. Take 5mg  by mouth daily at 12 noon and 1600.    [provider]  haloperidol decanoate (HALDOL DECANOATE) 100 MG/ML injection Inject  200 mg into the muscle every 30 (thirty) days.     [provider]  ibuprofen (ADVIL,MOTRIN) 800 MG tablet Take 800 mg by mouth 3 (three) times daily.    [provider]  lisinopril (PRINIVIL,ZESTRIL) 40 MG tablet Take 40 mg by mouth daily.    [provider]  pantoprazole (PROTONIX) 20 MG tablet Take 1 tablet (20 mg total) by mouth daily. 09/28/16   Antony Madura, PA-C  QUEtiapine (SEROQUEL) 50 MG tablet Take 50 mg by mouth 3 (three) times daily.    [provider]  sertraline (ZOLOFT) 50 MG tablet Take 50 mg by mouth daily.    [provider]  sodium chloride 1 g tablet Take 1 g  by mouth 2 (two) times daily.    [provider]    Family History History reviewed. No pertinent family history.  Social History Social History  Substance Use Topics  . Smoking status: Current Every Day Smoker    Packs/day: 0.50    Types: Cigarettes  . Smokeless tobacco: Never Used  . Alcohol use Yes     Allergies   Tomato   Review of Systems Review of Systems  All other systems reviewed and are negative.    Physical Exam Updated Vital Signs There were no vitals taken for this visit.  Physical Exam  Constitutional: He is oriented to person, place, and time. He appears well-developed and well-nourished.  HENT:  Head: Normocephalic and atraumatic.  Eyes: Conjunctivae and EOM are normal. Pupils are equal, round, and reactive to light. Right eye exhibits no discharge. Left eye exhibits no discharge. No scleral icterus.  Neck: Normal range of motion. Neck supple. No JVD present.  Cardiovascular: Normal rate, regular rhythm and normal heart sounds.  Exam reveals no gallop and no friction rub.   No murmur heard. Pulmonary/Chest: Effort normal and breath sounds normal. No respiratory distress. He has no wheezes. He has no rales. He exhibits no tenderness.  Abdominal: Soft. He exhibits no distension and no mass. There is no tenderness. There is no rebound and no guarding.  Musculoskeletal: Normal range of motion. He exhibits no edema or tenderness.  Neurological: He is alert and oriented to person, place, and time.  Skin: Skin is warm and dry.  Psychiatric: He has a normal mood and affect. His behavior is normal. Judgment and thought content normal.  Nursing note and vitals reviewed.    ED Treatments / Results  Labs (all labs ordered are listed, but only abnormal results are displayed) Labs Reviewed  COMPREHENSIVE METABOLIC PANEL - Abnormal; Notable for the following:       Result Value   CO2 21 (*)    Glucose, Bld 115 (*)    ALT 11 (*)    All other  components within normal limits  CBC - Abnormal; Notable for the following:    WBC 12.4 (*)    All other components within normal limits  ETHANOL  RAPID URINE DRUG SCREEN, HOSP PERFORMED    EKG  EKG Interpretation None       Radiology No results found.  Procedures Procedures (including critical care time)  Medications Ordered in ED Medications - No data to display   Initial Impression / Assessment and Plan / ED Course  I have reviewed the triage vital signs and the nursing notes.  Pertinent labs & imaging results that were available during my care of the patient were reviewed by me and considered in my medical decision making (see chart for  details).     Patient recently out of jail. Off his medications. Reports auditory and visual hallucinations. No suicidal or homicidal ideations. No physical complaints at this time. Medical screening exam was complete.  TTS recommendation is for a.m. psych evaluation.  Final Clinical Impressions(s) / ED Diagnoses   Final diagnoses:  Hallucinations    New Prescriptions New Prescriptions   No medications on file     Felipa FurnaceBrowning, Ardean Simonich, PA-C 11/25/16 0402    Glynn Octaveancour, Stephen, MD 11/25/16 [phone]

## 2016-11-25 DIAGNOSIS — R443 Hallucinations, unspecified: Secondary | ICD-10-CM | POA: Diagnosis not present

## 2016-11-25 DIAGNOSIS — F200 Paranoid schizophrenia: Secondary | ICD-10-CM

## 2016-11-25 DIAGNOSIS — F17210 Nicotine dependence, cigarettes, uncomplicated: Secondary | ICD-10-CM

## 2016-11-25 LAB — RAPID URINE DRUG SCREEN, HOSP PERFORMED
AMPHETAMINES: NOT DETECTED
Barbiturates: NOT DETECTED
Benzodiazepines: NOT DETECTED
Cocaine: NOT DETECTED
Opiates: NOT DETECTED
TETRAHYDROCANNABINOL: POSITIVE — AB

## 2016-11-25 MED ORDER — PANTOPRAZOLE SODIUM 20 MG PO TBEC: 20.0000 mg | DELAYED_RELEASE_TABLET | Freq: Every day | ORAL | Status: DC

## 2016-11-25 MED ORDER — LORAZEPAM 0.5 MG PO TABS
0.5000 mg | ORAL_TABLET | Freq: Once | ORAL | Status: AC
  Administered 2016-11-25: 0.5 mg via ORAL
  Filled 2016-11-25: qty 1

## 2016-11-25 MED ORDER — AMLODIPINE BESYLATE 5 MG PO TABS
10.0000 mg | ORAL_TABLET | Freq: Every day | ORAL | Status: DC
  Administered 2016-11-25 – 2016-11-26 (×2): 10 mg via ORAL
  Filled 2016-11-25 (×2): qty 2

## 2016-11-25 MED ORDER — CARBAMAZEPINE 200 MG PO TABS
200.0000 mg | ORAL_TABLET | Freq: Two times a day (BID) | ORAL | Status: DC
  Administered 2016-11-25 – 2016-11-26 (×2): 200 mg via ORAL
  Filled 2016-11-25 (×2): qty 1

## 2016-11-25 MED ORDER — IBUPROFEN 400 MG PO TABS
600.0000 mg | ORAL_TABLET | ORAL | Status: DC | PRN
  Administered 2016-11-25 (×2): 600 mg via ORAL
  Filled 2016-11-25 (×2): qty 1

## 2016-11-25 NOTE — ED Notes (Signed)
Pt requesting meds for "toothach." EDP made aware.

## 2016-11-25 NOTE — ED Notes (Signed)
Pt changing into purple scrubs.  

## 2016-11-25 NOTE — ED Notes (Signed)
Security paged to wand pt 

## 2016-11-25 NOTE — ED Notes (Signed)
TTS at bedside. 

## 2016-11-25 NOTE — ED Notes (Signed)
Pt belongings inventoried by this nurse and confirmed with pt. Pt signed belongings inventory sheet, original placed in binder, copy placed by the bedside. Belongings locked in top cabinet.

## 2016-11-25 NOTE — ED Notes (Signed)
TTS machine rolled into room. Pt speaking with TTS.

## 2016-11-25 NOTE — ED Notes (Signed)
Pt states he wishes to go home if he's unable to get meds. EDP made aware.

## 2016-11-25 NOTE — BH Assessment (Signed)
Tele Assessment Note   [name] [name] is an 46 y.o. male presenting to the ED after complaints of auditory hallucinations making his head hurt.  States he's been off his medication for quite some time. Received injectables at [facility] in the past. Patient has a pattern of jail time and non-compliance with medication. He was recently in jail for 18 days on a robbery charge. States he has been out of jail for 2 weeks. Appears homeless, was picked up by EMS at [facility].  The patient has a guardian, Abenezer Odonell, the patient's sister.   The last stable living situation for the patient appears to be at [facility] center in [place]. The patient has a history of drinking alcohol and would leave the facility to go drink.  The patient has been back in the [facility] Feb 2018.[name] Kitchen UDS was positive for Cannabis. Negative ETOH.   The patient denies SI, HI or command hallucinations. Acknowledged hearing voices but was vague about what he heard. The patient continued to tell this clinician he needed medication and food.  The patient was disheveled and had poor eye contact, rambling speech, somewhat drowsy, was preoccupied- talking to himself, tangential thought, fair insight and judgement.    Nira Conn, NP recommends am psych evaluation    Diagnosis: Schizophrenia  Past Medical History:  Past Medical History:  Diagnosis Date  . Bipolar depression (HCC)   . Hypertension   . Pneumonia    had aspiration pneumonia 5/14-on vent  . Schizophrenia [facility])     Past Surgical History:  Procedure Laterality Date  . arm surgery     hurt his arm 25 hr ago  . OPEN REDUCTION INTERNAL FIXATION (ORIF) PROXIMAL PHALANX Right 04/02/2013   Procedure: OPEN REDUCTION INTERNAL FIXATION (ORIF) RIGHT SMALL FINGER;  Surgeon: Tami Ribas, MD;  Location: [facility];  Service: Orthopedics;  Laterality: Right;    Family History: History reviewed. No pertinent family history.  Social History:   reports that he has been smoking Cigarettes.  He has been smoking about 0.50 packs per day. He has never used smokeless tobacco. He reports that he drinks alcohol. He reports that he does not use drugs.  Additional Social History:  Alcohol / Drug Use Pain Medications: see MAR Prescriptions: see MAR Over the Counter: see MAR  CIWA:   COWS:    PATIENT STRENGTHS: (choose at least two) Average or above average intelligence Motivation for treatment/growth  Allergies:  Allergies  Allergen Reactions  . Tomato     Home Medications:  (Not in a hospital admission)  OB/GYN Status:  No LMP for male patient.  General Assessment Data Location of Assessment: [facility] ED TTS Assessment: In system Is this a Tele or Face-to-Face Assessment?: Tele Assessment Is this an Initial Assessment or a Re-assessment for this encounter?: Initial Assessment Is patient pregnant?: No Pregnancy Status: No Living Arrangements: Other (Comment) (homeless) Can pt return to current living arrangement?: Yes Admission Status: Voluntary Is patient capable of signing voluntary admission?: Yes Referral Source: Self/Family/Friend Insurance type: MCR  Medical Screening Exam [facility] Walk-in ONLY) Medical Exam completed: Yes  Crisis Care Plan Living Arrangements: Other (Comment) (homeless) Name of Psychiatrist: Transport planner Name of Therapist: [facility]  Education Status Is patient currently in school?: Yes Highest grade of school patient has completed: 9th  Risk to self with the past 6 months Suicidal Ideation: No Has patient been a risk to self within the past 6 months prior to admission? : No Suicidal Intent: No  Has patient had any suicidal intent within the past 6 months prior to admission? : No Is patient at risk for suicide?: No Suicidal Plan?: No Has patient had any suicidal plan within the past 6 months prior to admission? : No Access to Means: No What has been your use of drugs/alcohol within the last 12  months?: n.a Previous Attempts/Gestures: No How many times?: 0 Intentional Self Injurious Behavior: None Family Suicide History: Unknown Persecutory voices/beliefs?: No Depression: No Substance abuse history and/or treatment for substance abuse?: Yes Suicide prevention information given to non-admitted patients: Not applicable  Risk to Others within the past 6 months Homicidal Ideation: No Does patient have any lifetime risk of violence toward others beyond the six months prior to admission? : No Thoughts of Harm to Others: No Current Homicidal Intent: No Current Homicidal Plan: No Access to Homicidal Means: No History of harm to others?: No Assessment of Violence: None Noted Does patient have access to weapons?: No Criminal Charges Pending?: No Does patient have a court date: No Is patient on probation?: Unknown  Psychosis Hallucinations: Auditory Delusions: None noted  Mental Status Report Appearance/Hygiene: Bizarre, Disheveled Eye Contact: Poor Motor Activity: Freedom of movement, Unremarkable Speech: Other (Comment) (rambling) Level of Consciousness: Drowsy Mood: Preoccupied Affect: Flat Anxiety Level: None Thought Processes: Tangential Judgement: Impaired Orientation: Person, Place Obsessive Compulsive Thoughts/Behaviors: None  Cognitive Functioning Concentration: Decreased Memory: Recent Impaired, Remote Impaired IQ: Average Insight: Poor Impulse Control: Poor Appetite: Poor Weight Loss: 0 Weight Gain: 0 Sleep: Decreased Vegetative Symptoms: None  ADLScreening [facility] Assessment Services) Patient's cognitive ability adequate to safely complete daily activities?: Yes Patient able to express need for assistance with ADLs?: Yes Independently performs ADLs?: Yes (appropriate for developmental age)  Prior Inpatient Therapy Prior Inpatient Therapy: Yes Prior Therapy Dates: 2014 [facility] Reason for  Treatment: schizophrenia  Prior Outpatient Therapy Prior Outpatient Therapy: Yes Prior Therapy Dates: unknown Prior Therapy Facilty/Provider(s): [facility] Reason for Treatment: schizophrenia Does patient have an ACCT team?: Unknown Does patient have Intensive In-House Services?  : No Does patient have [facility] services? : Yes Does patient have P4CC services?: No  ADL Screening (condition at time of admission) Patient's cognitive ability adequate to safely complete daily activities?: Yes Patient able to express need for assistance with ADLs?: Yes Independently performs ADLs?: Yes (appropriate for developmental age)             Merchant navy officerAdvance Directives (For Healthcare) Does Patient Have a Medical Advance Directive?: No    Additional Information 1:1 In Past 12 Months?: No CIRT Risk: No Elopement Risk: No Does patient have medical clearance?: Yes     Disposition:  Disposition Initial Assessment Completed for this Encounter: Yes Disposition of Patient: Other dispositions Other disposition(s): Other (Comment)  Vonzell Schlattershley H Anthonella Klausner 11/25/2016 1:48 AM

## 2016-11-25 NOTE — ED Notes (Signed)
Patient requested urinal.

## 2016-11-25 NOTE — BH Assessment (Signed)
Nira ConnJason Berry, NP recommends am psych evaluation

## 2016-11-25 NOTE — Consult Note (Signed)
Telepsych Consultation   Reason for Consult:  Telepsych consult for 46yo male with auditory hallucinations making his head hurt. Referring Physician:  EDP Patient Identification: [name] [name] MRN:  [id number] Principal Diagnosis: <principal problem not specified> Diagnosis: Paranoid Schizophrenia Patient Active Problem List   Diagnosis Date Noted  . 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 Acute respiratory failure with hypoxia ([place]) [J96.01] 11/17/2012  . Aspiration pneumonia ([place]) [J69.0] 11/17/2012  . Encephalopathy acute [G93.40] 11/17/2012    Total Time spent with patient: 30 minutes  Subjective:   [name] [name] is a 46 y.o. male patient admitted with auditory hallucination that makes his head hurt.  HPI:  Per tele psych assessment documented on 11/25/16 by Marinus Maw, "[name] [name] is an 46 y.o. male presenting to the ED after complaints of auditory hallucinations making his head hurt.  States he's been off his medication for quite some time. Received injectables at [facility] in the past. Patient has a pattern of jail time and non-compliance with medication. He was recently in jail for 18 days on a robbery charge. States he has been out of jail for 2 weeks. Appears homeless, was picked up by EMS at [facility].  The patient has a guardian, [name] [name], the patient's sister.   The last stable living situation for the patient appears to be at [facility] in [place]. The patient has a history of drinking alcohol and would leave the facility to go drink.  The patient has been back in the [facility] Feb 2018.[name] Kitchen UDS was positive for Cannabis. Negative ETOH.   The patient denies SI, HI or command hallucinations. Acknowledged hearing voices but was vague about what he heard. The patient continued to tell this clinician he needed medication and food.  The patient was disheveled and had poor eye contact, rambling speech, somewhat drowsy, was preoccupied-  talking to himself, tangential thought, fair insight and judgement".   Today during my in-camera assessment with patient, patient presents in a flat mood and restricted affect. He has his lunch tray infront of him and has part of his attention on eating his salad. Pt describes his mood today as okay, said he came to the hospital voluntarily to restart on his medications. Pt said he has been off his medication for about 4years and use to get the injectables from [place]. Patient continues to endorse auditory hallucination, said the voices are not clear but whispering to him. Patient currently denies any suicidal or homicidal ideations, denies visual hallucinations, said he feels like he could go home today.  Past Psychiatric History:  Bipolar depression Schizophrenia  Risk to Self: Suicidal Ideation: No Suicidal Intent: No Is patient at risk for suicide?: No Suicidal Plan?: No Access to Means: No What has been your use of drugs/alcohol within the last 12 months?: n.a How many times?: 0 Intentional Self Injurious Behavior: None Risk to Others: Homicidal Ideation: No Thoughts of Harm to Others: No Current Homicidal Intent: No Current Homicidal Plan: No Access to Homicidal Means: No History of harm to others?: No Assessment of Violence: None Noted Does patient have access to weapons?: No Criminal Charges Pending?: No Does patient have a court date: No Prior Inpatient Therapy: Prior Inpatient Therapy: Yes Prior Therapy Dates: 2014 Prior Therapy Facilty/Provider(s): [facility], other admissions as well Reason for Treatment: schizophrenia Prior Outpatient Therapy: Prior Outpatient Therapy: Yes Prior Therapy Dates: unknown Prior Therapy Facilty/Provider(s): [facility] Reason for Treatment: schizophrenia Does patient have an ACCT team?: Unknown Does patient have Intensive In-House Services?  :  No Does patient have [facility] services? : Yes Does patient have P4CC services?: No  Past Medical History:   Past Medical History:  Diagnosis Date  . Bipolar depression ([place])   . Hypertension   . Pneumonia    had aspiration pneumonia 5/14-on vent  . Schizophrenia [facility])     Past Surgical History:  Procedure Laterality Date  . arm surgery     hurt his arm 25 hr ago  . OPEN REDUCTION INTERNAL FIXATION (ORIF) PROXIMAL PHALANX Right 04/02/2013   Procedure: OPEN REDUCTION INTERNAL FIXATION (ORIF) RIGHT SMALL FINGER;  Surgeon: Tennis Must, MD;  Location: [place];  Service: Orthopedics;  Laterality: Right;   Family History: History reviewed. No pertinent family history. Family Psychiatric  History: Unkown Social History:  History  Alcohol Use  . Yes     History  Drug Use No    Comment: hx    Social History   Social History  . Marital status: Single    Spouse name: N/A  . Number of children: N/A  . Years of education: N/A   Social History Main Topics  . Smoking status: Current Every Day Smoker    Packs/day: 0.50    Types: Cigarettes  . Smokeless tobacco: Never Used  . Alcohol use Yes  . Drug use: No     Comment: hx  . Sexual activity: No   Other Topics Concern  . None   Social History Narrative  . None   Additional Social History: None    Allergies:   Allergies  Allergen Reactions  . Tomato     Labs:  Results for orders placed or performed during the hospital encounter of 11/24/16 (from the past 48 hour(s))  Comprehensive metabolic panel     Status: Abnormal   Collection Time: 11/24/16  9:49 PM  Result Value Ref Range   Sodium 137 135 - 145 mmol/L   Potassium 3.5 3.5 - 5.1 mmol/L   Chloride 106 101 - 111 mmol/L   CO2 21 (L) 22 - 32 mmol/L   Glucose, Bld 115 (H) 65 - 99 mg/dL   BUN 10 6 - 20 mg/dL   Creatinine, Ser 1.02 0.61 - 1.24 mg/dL   Calcium 9.0 8.9 - 10.3 mg/dL   Total Protein 6.8 6.5 - 8.1 g/dL   Albumin 3.6 3.5 - 5.0 g/dL   AST 19 15 - 41 U/L   ALT 11 (L) 17 - 63 U/L   Alkaline Phosphatase 77 38 - 126 U/L   Total Bilirubin 0.4  0.3 - 1.2 mg/dL   GFR calc non Af Amer >60 >60 mL/min   GFR calc Af Amer >60 >60 mL/min    Comment: (NOTE) The eGFR has been calculated using the CKD EPI equation. This calculation has not been validated in all clinical situations. eGFR's persistently <60 mL/min signify possible Chronic Kidney Disease.    Anion gap 10 5 - 15  Ethanol     Status: None   Collection Time: 11/24/16  9:49 PM  Result Value Ref Range   Alcohol, Ethyl (B) <5 <5 mg/dL    Comment:        LOWEST DETECTABLE LIMIT FOR SERUM ALCOHOL IS 5 mg/dL FOR MEDICAL PURPOSES ONLY   cbc     Status: Abnormal   Collection Time: 11/24/16  9:49 PM  Result Value Ref Range   WBC 12.4 (H) 4.0 - 10.5 K/uL   RBC 5.11 4.22 - 5.81 MIL/uL   Hemoglobin 14.1 13.0 -  17.0 g/dL   HCT 43.0 39.0 - 52.0 %   MCV 84.1 78.0 - 100.0 fL   MCH 27.6 26.0 - 34.0 pg   MCHC 32.8 30.0 - 36.0 g/dL   RDW 13.7 11.5 - 15.5 %   Platelets 170 150 - 400 K/uL  Rapid urine drug screen (hospital performed)     Status: Abnormal   Collection Time: 11/25/16 12:19 AM  Result Value Ref Range   Opiates NONE DETECTED NONE DETECTED   Cocaine NONE DETECTED NONE DETECTED   Benzodiazepines NONE DETECTED NONE DETECTED   Amphetamines NONE DETECTED NONE DETECTED   Tetrahydrocannabinol POSITIVE (A) NONE DETECTED   Barbiturates NONE DETECTED NONE DETECTED    Comment:        DRUG SCREEN FOR MEDICAL PURPOSES ONLY.  IF CONFIRMATION IS NEEDED FOR ANY PURPOSE, NOTIFY LAB WITHIN 5 DAYS.        LOWEST DETECTABLE LIMITS FOR URINE DRUG SCREEN Drug Class       Cutoff (ng/mL) Amphetamine      1000 Barbiturate      200 Benzodiazepine   009 Tricyclics       381 Opiates          300 Cocaine          300 THC              50     No current facility-administered medications for this encounter.    Current Outpatient Prescriptions  Medication Sig Dispense Refill  . amLODipine (NORVASC) 10 MG tablet Take 1 tablet (10 mg total) by mouth daily. (Patient not taking: Reported  on 11/25/2016) 20 tablet 0  . carbamazepine (TEGRETOL) 200 MG tablet Take 1 tablet (200 mg total) by mouth 2 (two) times daily. (Patient not taking: Reported on 11/25/2016) 30 tablet 0  . pantoprazole (PROTONIX) 20 MG tablet Take 1 tablet (20 mg total) by mouth daily. (Patient not taking: Reported on 11/25/2016) 30 tablet 0    Musculoskeletal: N/A-Telepsych assessment  Psychiatric Specialty Exam: Physical Exam  Review of Systems  Psychiatric/Behavioral: Positive for depression, hallucinations (hearing whispers) and substance abuse (+THC). Negative for memory loss and suicidal ideas. The patient is not nervous/anxious and does not have insomnia.   All other systems reviewed and are negative.   Blood pressure (!) 150/106, pulse 72, temperature 97.7 F (36.5 C), temperature source Oral, resp. rate 19, SpO2 98 %.There is no height or weight on file to calculate BMI.  General Appearance: Pt wearing a hospital scrub  Eye Contact:  Fair  Speech:  Clear and Coherent and Normal Rate  Volume:  Normal  Mood:  Calm, flat  Affect:  Congruent and Labile  Thought Process:  Coherent  Orientation:  Full (Time, Place, and Person)  Thought Content:  WDL and Logical  Suicidal Thoughts:  No  Homicidal Thoughts:  No  Memory:  Immediate;   Fair Recent;   Fair Remote;   Fair  Judgement:  Poor  Insight:  Lacking  Psychomotor Activity:  NA  Concentration:  Concentration: Good and Attention Span: Fair  Recall:  [employer] of Knowledge:  Good  Language:  Good  Akathisia:  NA  Handed:  Right  AIMS (if indicated):     Assets:  Communication Skills Desire for Improvement Physical Health  ADL's:  Intact  Cognition:  WNL  Sleep:        Treatment Plan Summary: Daily contact with patient to assess and evaluate symptoms and progress in treatment, Medication management and  Plan pt appears to lack insight on his condition. He has been off of medications for a while and still endorsing AH. Pt will be  admitted inpatient for medication management.  Disposition: Recommend psychiatric Inpatient admission when medically cleared. Supportive therapy provided about ongoing stressors.  Vicenta Aly, NP 11/25/2016 1:44 PM

## 2016-11-26 DIAGNOSIS — R443 Hallucinations, unspecified: Secondary | ICD-10-CM | POA: Diagnosis not present

## 2016-11-26 NOTE — ED Notes (Addendum)
Pt signed verifying all items present when belongings were returned - dressed himself.

## 2016-11-26 NOTE — Discharge Instructions (Signed)
Please follow up with [facility] to pick up your medicines

## 2016-11-26 NOTE — ED Notes (Signed)
Pt asking for meds - advised pt meds are due at 1000. Voiced understanding. Pt then asking for sleep meds - advised pt sleep meds are only given at night. Voiced understanding.

## 2016-11-26 NOTE — ED Notes (Signed)
Pt given coffee as requested.

## 2016-11-26 NOTE — BH Assessment (Addendum)
0900 Writer ran pt by Ferne ReusJustina Okonkwo NP who recommends that pt be discharged as he doesn't meet inpatient criteria at this time. Writer spoke w/ pt who continues to denies SI, HI and denies [facility]. He reports [facility] will [facility] and [facility] that [facility]. Writer notified pt's RN Kriste BasqueBecky of TTS recommendation for discharge.   Evette Cristalaroline Paige Jamus [place], [place] Therapeutic Triage Specialist  [phone] Pt is cooperative. He is wearing scrubs and appears disheveled. Pt tells Clinical research associatewriter that he wants to leave [facility]. He say that he needs a prescription for psych meds. Pt reports prior meds that have worked for him include Haldol and Cogentin. He says he called EMS for "treatment" of his AH. Pt now denies [facility]. He denies SI and HI. [facility]. Writer will discuss disposition with TTS extender. Pt appears eager to leave ED.   Evette Cristalaroline Paige Kaveh Kissinger, [place] Therapeutic Triage Specialist

## 2016-11-26 NOTE — ED Notes (Signed)
Pt asked Sitter for coffee - sitter advised pt snack time is at 1000 and may have it then. Pt then ambulatory to nurses' desk and asked RN - advised pt may have at snack time at 1000. Also advised pt RN will advise of [facility]. Pt ambulatory back to room - stating "She told me it would be in a couple of hours - around 1100. Pt sitting on bed watching tv.

## 2016-11-26 NOTE — ED Notes (Signed)
Pt signed "No Harm Contract" - copy given to pt. 

## 2016-11-26 NOTE — ED Notes (Signed)
Coffee, crackers, and peanut butter given as requested for snack. Pt aware Dr Fredderick PhenixBelfi aware of recommendation for d/c. Pt noted to be talking to himself - stating "Be good so they won't hate you." No self-injurious behaviors noted and pt cooperative w/staff.

## 2016-11-26 NOTE — ED Notes (Signed)
Pt given 2 more cups of coffee as requested. Pt asking for large cup. Advised pt only have one size cup. Pt then asked for cream and sugar - given.

## 2016-12-10 ENCOUNTER — Encounter (HOSPITAL_COMMUNITY): Payer: Self-pay

## 2016-12-10 ENCOUNTER — Emergency Department (HOSPITAL_COMMUNITY)
Admission: EM | Admit: 2016-12-10 | Discharge: 2016-12-10 | Disposition: A | Discharge status: Home / Self Care | Payer: Medicare Other | Attending: Emergency Medicine | Admitting: Emergency Medicine

## 2016-12-10 DIAGNOSIS — R600 Localized edema: Secondary | ICD-10-CM | POA: Diagnosis present

## 2016-12-10 DIAGNOSIS — Z79899 Other long term (current) drug therapy: Secondary | ICD-10-CM | POA: Diagnosis not present

## 2016-12-10 DIAGNOSIS — I10 Essential (primary) hypertension: Secondary | ICD-10-CM | POA: Insufficient documentation

## 2016-12-10 DIAGNOSIS — R2243 Localized swelling, mass and lump, lower limb, bilateral: Secondary | ICD-10-CM | POA: Diagnosis not present

## 2016-12-10 DIAGNOSIS — F17210 Nicotine dependence, cigarettes, uncomplicated: Secondary | ICD-10-CM | POA: Insufficient documentation

## 2016-12-10 MED ORDER — FUROSEMIDE 20 MG PO TABS: 20.0000 mg | ORAL_TABLET | Freq: Every day | ORAL | 0 refills | Status: DC

## 2016-12-10 MED ORDER — FUROSEMIDE 20 MG PO TABS
40.0000 mg | ORAL_TABLET | Freq: Once | ORAL | Status: AC
  Administered 2016-12-10: 40 mg via ORAL
  Filled 2016-12-10: qty 2

## 2016-12-10 NOTE — ED Provider Notes (Signed)
[facility]-EMERGENCY DEPT Provider Note   CSN: [id number] Arrival date & time: 12/10/16  1615     History   Chief Complaint Chief Complaint  Patient presents with  . Leg Swelling    HPI [name] [name] is a 46 y.o. male with past medical history significant for schizophrenia, bipolar, hypertension off all medications presents to the emergency department today for bilateral leg swelling 2 days. The patient notes that his legs have gradually become larger over the last 2 days. It is associated with a dull, achy pain that is constant, worse with walking, rated 7/10 at worst. Patient has not taken anything for symptoms. Patient denies trauma, immobilization, history of blood clots, recent travel, fever, recent surgery, shortness of breath, chest pain, abdominal pain, nausea/vomiting/diarrhea. Patient is able to walk. The patient notes that he uses crack cocaine, and drinks a 40 a day. Last used crack yesterday.  Last drink alcohol last night. Patient is falling asleep during history. 20 pack year history  HPI  Past Medical History:  Diagnosis Date  . Bipolar depression ([facility])   . Hypertension   . Pneumonia    had aspiration pneumonia 5/14-on vent  . Schizophrenia [facility])     Patient Active Problem List   Diagnosis Date Noted  . Paranoid schizophrenia (HCC) 04/20/2013  . Acute respiratory failure with hypoxia ([facility]) 11/17/2012  . Aspiration pneumonia ([facility]) 11/17/2012  . Encephalopathy acute 11/17/2012    Past Surgical History:  Procedure Laterality Date  . arm surgery     hurt his arm 25 hr ago  . OPEN REDUCTION INTERNAL FIXATION (ORIF) PROXIMAL PHALANX Right 04/02/2013   Procedure: OPEN REDUCTION INTERNAL FIXATION (ORIF) RIGHT SMALL FINGER;  Surgeon: Tami Ribas, MD;  Location: [facility];  Service: Orthopedics;  Laterality: Right;       Home Medications    Prior to Admission medications   Medication Sig Start Date End Date Taking? Authorizing Provider  amLODipine  (NORVASC) 10 MG tablet Take 1 tablet (10 mg total) by mouth daily. Patient not taking: Reported on 11/25/2016 09/02/16   Charlynne Pander, MD  carbamazepine (TEGRETOL) 200 MG tablet Take 1 tablet (200 mg total) by mouth 2 (two) times daily. Patient not taking: Reported on 11/25/2016 09/02/16   Charlynne Pander, MD  furosemide (LASIX) 20 MG tablet Take 1 tablet (20 mg total) by mouth daily. 12/10/16   Rafiq Bucklin, Elmer Sow, PA-C  pantoprazole (PROTONIX) 20 MG tablet Take 1 tablet (20 mg total) by mouth daily. Patient not taking: Reported on 11/25/2016 09/28/16   Antony Madura, PA-C    Family History History reviewed. No pertinent family history.  Social History Social History  Substance Use Topics  . Smoking status: Current Every Day Smoker    Packs/day: 0.50    Types: Cigarettes  . Smokeless tobacco: Never Used  . Alcohol use Yes     Comment: 2 - 40 oz per day      Allergies   Tomato   Review of Systems Review of Systems  All other systems reviewed and are negative.    Physical Exam Updated Vital Signs BP 131/86   Pulse 90   Temp 99 F (37.2 C) (Oral)   Resp 17   Ht 6' (1.829 m)   Wt 122.5 kg (270 lb)   SpO2 92%   BMI 36.62 kg/m   Physical Exam  Constitutional: He appears well-developed and well-nourished. He is sleeping and cooperative. He is easily aroused.  Non-toxic appearance.  HENT:  Head: Normocephalic and atraumatic.  Right Ear: External ear normal.  Left Ear: External ear normal.  Mouth/Throat: Oropharynx is clear and moist.  Poor dentition  Eyes: Conjunctivae are normal. Pupils are equal, round, and reactive to light.  Neck: Neck supple.  Cardiovascular: Normal rate, regular rhythm, normal heart sounds and intact distal pulses.   No murmur heard. Pulmonary/Chest: Effort normal and breath sounds normal. He exhibits no tenderness.  Abdominal: Soft. Bowel sounds are normal. There is no tenderness. There is no rebound and no guarding.  Musculoskeletal: He  exhibits no edema.       Right hip: Normal.       Left hip: Normal.       Right knee: Normal.       Left knee: Normal.       Right ankle: He exhibits swelling. He exhibits normal range of motion. No tenderness. Achilles tendon normal.       Left ankle: He exhibits swelling. He exhibits normal range of motion. No tenderness. Achilles tendon normal.       Right foot: There is normal range of motion, no tenderness and no bony tenderness.       Left foot: There is normal range of motion, no tenderness and no bony tenderness.  Lymphadenopathy:    He has no cervical adenopathy.  Neurological: He is easily aroused. He has normal strength. No sensory deficit. Gait normal.  Skin: No rash noted. He is not diaphoretic.  Psychiatric: He has a normal mood and affect.  Nursing note and vitals reviewed.    ED Treatments / Results  Labs (all labs ordered are listed, but only abnormal results are displayed) Labs Reviewed  BRAIN NATRIURETIC PEPTIDE    EKG  EKG Interpretation None       Radiology No results found.  Procedures Procedures (including critical care time)  Medications Ordered in ED Medications  furosemide (LASIX) tablet 40 mg (40 mg Oral Given 12/10/16 2140)     Initial Impression / Assessment and Plan / ED Course  I have reviewed the triage vital signs and the nursing notes.  Pertinent labs & imaging results that were available during my care of the patient were reviewed by me and considered in my medical decision making (see chart for details).     This is a 46 y.o. Male who presents for bilateral leg swelling 2 days. On exam he has 2+ pitting edema b/l. No pain on palpation of the lower extremity. Full ROM. Strength able and equal b/l. NV intact distally. Cardiopulmonary exam unremarkable so do not feel CXR is needed at this time. Patient denies trauma, immobilization, history of blood clots, recent travel, fever, recent surgery, shortness of breath, or chest pain.   Amlodipine considered as source, however patient off all medication.  Given dose of lasix. Will give patient 5 day course of lasix with outpatient [facility] with good follow up. BNP obtained for outpatient followup. Return precautions given. Patient verbalizes understanding. Patient in agreeance with plan. Patient told he can return to emergency department at any time for new or worsening symptoms.   Patient discussed and seen with Dr. Rhunette Croft.   Final Clinical Impressions(s) / ED Diagnoses   Final diagnoses:  Bilateral lower extremity edema    New Prescriptions New Prescriptions   FUROSEMIDE (LASIX) 20 MG TABLET    Take 1 tablet (20 mg total) by mouth daily.     Princella Pellegrini 12/10/16 2149    Rhunette Croft,  Ankit, MD 12/11/16 1653

## 2016-12-10 NOTE — ED Triage Notes (Signed)
To triage via EMS.  Pt c/o ankle/leg swelling and pain x days.   Pt homeless.  Last used crack last night.  No alcohol today, several energy drinks.

## 2016-12-10 NOTE — Discharge Instructions (Signed)
Please take medication as prescribed. His medication may make you frequently urinate. She develop any adverse reactions to this medication, stop taking the medication and call your doctor. Please follow-up with [facility] in the next 2 days to follow with lab results and for further treatment. They will also be checking how the medication is working. If he develops shortness of breath or chest pain return to the emergency department. You can return if symptoms worsen.

## 2016-12-12 ENCOUNTER — Encounter: Payer: Self-pay | Admitting: Pediatric Intensive Care

## 2016-12-12 NOTE — Congregational Nurse Program (Signed)
Congregational Nurse Program Note  Date of Encounter: 12/12/2016  Past Medical History: Past Medical History:  Diagnosis Date  . Bipolar depression (HCC)   . Hypertension   . Pneumonia    had aspiration pneumonia 5/14-on vent  . Schizophrenia [facility])     Encounter Details:     CNP Questionnaire - 12/12/16 1015      Patient Demographics   Is this a new or existing patient? New   Patient is considered a/an Not Applicable   Race African-American/Black     Patient Assistance   Location of Patient Assistance [place]   Patient's financial/insurance status Self-Pay (Uninsured);Low Income   Uninsured Patient ([employer] Research officer, trade unionCard/Care Connects) Yes   Interventions Not Applicable   Patient referred to apply for the following financial assistance Not Applicable   Food insecurities addressed Not Applicable   Transportation assistance No   Assistance securing medications No   Educational health offerings Not Applicable     Encounter Details   Primary purpose of visit Acute Illness/Condition Visit;Chronic Illness/Condition Visit   Was an Emergency Department visit averted? Not Applicable   Does patient have a medical provider? No   Patient referred to Not Applicable   Was a mental health screening completed? (GAINS tool) No   Does patient have dental issues? No   Does patient have vision issues? No   Does your patient have an abnormal blood pressure today? Yes   Since previous encounter, have you referred patient for abnormal blood pressure that resulted in a new diagnosis or medication change? No   Does your patient have an abnormal blood glucose today? No   Since previous encounter, have you referred patient for abnormal blood glucose that resulted in a new diagnosis or medication change? No   Was there a life-saving intervention made? No     New client- client unable to clearly answer health questions as he fell asleep multiple times during visit. CN reviewed last ED visit in EPIC. Client  states he does not take prescribed medication. States he lost the paper prescription for Lasix that he received from ED visit. CN will contact Dr Jonni SangerP Wright for new prescription for lasix. CN told client to remain on [facility] premises to hand off prescription.

## 2016-12-13 ENCOUNTER — Encounter: Payer: Self-pay | Admitting: Pediatric Intensive Care

## 2016-12-13 MED FILL — carBAMazepine 200 MG TABS: 200 | 30 days supply | Qty: 60 | Fill #0

## 2016-12-13 MED FILL — AMLODIPINE BESYLATE 10 MG T: 10 | 30 days supply | Qty: 30 | Fill #0

## 2016-12-13 MED FILL — PANTOPRAZOLE SOD DR 20 MG T: 20 | 30 days supply | Qty: 30 | Fill #0

## 2016-12-13 MED FILL — FUROSEMIDE 20 MG TABLET: 20 | 5 days supply | Qty: 5 | Fill #0

## 2016-12-21 ENCOUNTER — Encounter (HOSPITAL_COMMUNITY): Payer: Self-pay | Admitting: Emergency Medicine

## 2016-12-21 ENCOUNTER — Emergency Department (HOSPITAL_COMMUNITY): Payer: Medicare Other

## 2016-12-21 ENCOUNTER — Emergency Department (HOSPITAL_COMMUNITY)
Admission: EM | Admit: 2016-12-21 | Discharge: 2016-12-23 | Disposition: A | Discharge status: Home / Self Care | Payer: Medicare Other | Attending: Emergency Medicine | Admitting: Emergency Medicine

## 2016-12-21 DIAGNOSIS — M7989 Other specified soft tissue disorders: Secondary | ICD-10-CM | POA: Insufficient documentation

## 2016-12-21 DIAGNOSIS — Z79899 Other long term (current) drug therapy: Secondary | ICD-10-CM | POA: Diagnosis not present

## 2016-12-21 DIAGNOSIS — F319 Bipolar disorder, unspecified: Secondary | ICD-10-CM | POA: Insufficient documentation

## 2016-12-21 DIAGNOSIS — F17210 Nicotine dependence, cigarettes, uncomplicated: Secondary | ICD-10-CM | POA: Diagnosis not present

## 2016-12-21 DIAGNOSIS — I10 Essential (primary) hypertension: Secondary | ICD-10-CM | POA: Insufficient documentation

## 2016-12-21 LAB — CBC WITH DIFFERENTIAL/PLATELET
BASOS ABS: 0 10*3/uL (ref 0.0–0.1)
BASOS PCT: 0 %
Eosinophils Absolute: 0.3 10*3/uL (ref 0.0–0.7)
Eosinophils Relative: 3 %
HEMATOCRIT: 41.8 % (ref 39.0–52.0)
Hemoglobin: 13.4 g/dL (ref 13.0–17.0)
Lymphocytes Relative: 20 %
Lymphs Abs: 1.8 10*3/uL (ref 0.7–4.0)
MCH: 27.5 pg (ref 26.0–34.0)
MCHC: 32.1 g/dL (ref 30.0–36.0)
MCV: 85.8 fL (ref 78.0–100.0)
MONO ABS: 0.7 10*3/uL (ref 0.1–1.0)
Monocytes Relative: 8 %
NEUTROS ABS: 6.1 10*3/uL (ref 1.7–7.7)
Neutrophils Relative %: 69 %
PLATELETS: 163 10*3/uL (ref 150–400)
RBC: 4.87 MIL/uL (ref 4.22–5.81)
RDW: 15.4 % (ref 11.5–15.5)
WBC: 9 10*3/uL (ref 4.0–10.5)

## 2016-12-21 LAB — COMPREHENSIVE METABOLIC PANEL
ALBUMIN: 3.3 g/dL — AB (ref 3.5–5.0)
ALT: 23 U/L (ref 17–63)
AST: 46 U/L — AB (ref 15–41)
Alkaline Phosphatase: 59 U/L (ref 38–126)
Anion gap: 6 (ref 5–15)
BUN: 6 mg/dL (ref 6–20)
CHLORIDE: 101 mmol/L (ref 101–111)
CO2: 29 mmol/L (ref 22–32)
Calcium: 9 mg/dL (ref 8.9–10.3)
Creatinine, Ser: 0.69 mg/dL (ref 0.61–1.24)
GFR calc Af Amer: >60 mL/min (ref >60)
GFR calc non Af Amer: >60 mL/min (ref >60)
GLUCOSE: 94 mg/dL (ref 65–99)
POTASSIUM: 3.8 mmol/L (ref 3.5–5.1)
Sodium: 136 mmol/L (ref 135–145)
Total Bilirubin: 0.7 mg/dL (ref 0.3–1.2)
Total Protein: 6.5 g/dL (ref 6.5–8.1)

## 2016-12-21 LAB — URINALYSIS, ROUTINE W REFLEX MICROSCOPIC
BACTERIA UA: NONE SEEN
Bilirubin Urine: NEGATIVE
Glucose, UA: NEGATIVE mg/dL
Ketones, ur: NEGATIVE mg/dL
Leukocytes, UA: NEGATIVE
Nitrite: NEGATIVE
PROTEIN: NEGATIVE mg/dL
SQUAMOUS EPITHELIAL / LPF: NONE SEEN
Specific Gravity, Urine: 1.002 — ABNORMAL LOW (ref 1.005–1.030)
WBC UA: NONE SEEN WBC/hpf (ref 0–5)
pH: 7 (ref 5.0–8.0)

## 2016-12-21 MED ORDER — PANTOPRAZOLE SODIUM 20 MG PO TBEC
20.0000 mg | DELAYED_RELEASE_TABLET | Freq: Every day | ORAL | Status: DC
  Administered 2016-12-22 – 2016-12-23 (×3): 20 mg via ORAL
  Filled 2016-12-21 (×6): qty 1

## 2016-12-21 MED ORDER — FUROSEMIDE 20 MG PO TABS
20.0000 mg | ORAL_TABLET | Freq: Every day | ORAL | Status: DC
  Administered 2016-12-22 – 2016-12-23 (×2): 20 mg via ORAL
  Filled 2016-12-21 (×2): qty 1

## 2016-12-21 MED ORDER — FUROSEMIDE 20 MG PO TABS
40.0000 mg | ORAL_TABLET | Freq: Once | ORAL | Status: AC
  Administered 2016-12-21: 40 mg via ORAL
  Filled 2016-12-21: qty 2

## 2016-12-21 MED ORDER — CARBAMAZEPINE 200 MG PO TABS
200.0000 mg | ORAL_TABLET | Freq: Two times a day (BID) | ORAL | Status: DC
  Administered 2016-12-21 – 2016-12-23 (×3): 200 mg via ORAL
  Filled 2016-12-21 (×3): qty 1

## 2016-12-21 MED ORDER — AMLODIPINE BESYLATE 5 MG PO TABS
10.0000 mg | ORAL_TABLET | Freq: Every day | ORAL | Status: DC
  Administered 2016-12-22 – 2016-12-23 (×2): 10 mg via ORAL
  Filled 2016-12-21 (×2): qty 2

## 2016-12-21 NOTE — Progress Notes (Signed)
CSW/RNCM spoke at length with pt's sister and BIL re pt's disposition. [facility], but now the shelter is refusing to accept him back due to unknown reasons.  Sister and BIL are unable to care for him in their home as they have 4 teenage children, and limited space to accommodate pt.  Additionally, the couple is concerned that pt has been off his psychiatric and other meds for sometime, and they are afraid that pt may fall ill, or have a psychiatric break.  There is no other family willing to care for pt, even though he has 10 siblings, per his sister.  As of today, pt is a ward of [place] DSS, as pt's sister has relinquished guardianship.  DSS unable to place pt in a facility and told his sister that they were unsure when they would find him a place.  Pt's sister unable to recall name of pt's appointed guardian.  CSW will update dayshift CSW who will contact DSS and assist with placement as necessary.

## 2016-12-21 NOTE — ED Provider Notes (Addendum)
[facility]-EM[facility]Y DEPT Provider Note   CSN: [id number] Arrival date & time: 12/21/16  1952     History   Chief Complaint Chief Complaint  Patient presents with  . Leg Swelling    Edema    HPI [name] [name] is a 46 y.o. male schizophrenia who was evicted from the shelter 3 days ago at which time he lost his medication.  This a morbidly obese African-American male with a history of bipolar, depression, hypertension, pneumonia, schizophrenia.  He was evicted from his shelter 3 days ago and has not been able to take his medication because it was "lost" He is denying any chest pain or shortness of breath but does endorse swelling of his lower legs      Past Medical History:  Diagnosis Date  . Bipolar depression (HCC)   . Hypertension   . Pneumonia    had aspiration pneumonia 5/14-on vent  . Schizophrenia [facility](HCC)     Patient Active Problem List   Diagnosis Date Noted  . Somnolence 01/09/2017  . Paranoid schizophrenia (HCC) 04/20/2013  . Acute respiratory failure with hypercapnia (HCC) 11/17/2012  . Encephalopathy acute 11/17/2012    Past Surgical History:  Procedure Laterality Date  . arm surgery     hurt his arm 25 hr ago  . OPEN REDUCTION INTERNAL FIXATION (ORIF) PROXIMAL PHALANX Right 04/02/2013   Procedure: OPEN REDUCTION INTERNAL FIXATION (ORIF) RIGHT SMALL FINGER;  Surgeon: Tami RibasKevin R Kuzma, MD;  Location: [facility];  Service: Orthopedics;  Laterality: Right;       Home Medications    Prior to Admission medications   Medication Sig Start Date End Date Taking? Authorizing Provider  albuterol (PROVENTIL HFA;VENTOLIN HFA) 108 (90 Base) MCG/ACT inhaler Inhale 1-2 puffs into the lungs every 6 (six) hours as needed for wheezing or shortness of breath. 12/22/16   Arby BarrettePfeiffer, Marcy, MD  amLODipine (NORVASC) 10 MG tablet Take 1 tablet (10 mg total) by mouth daily. 12/22/16   Arby BarrettePfeiffer, Marcy, MD  carbamazepine (TEGRETOL) 200 MG tablet Take 1 tablet (200 mg total)  by mouth 2 (two) times daily. Patient not taking: Reported on 11/25/2016 09/02/16   Charlynne PanderYao, David Hsienta, MD  furosemide (LASIX) 20 MG tablet Take 1 tablet (20 mg total) by mouth daily. 12/22/16   Arby BarrettePfeiffer, Marcy, MD  pantoprazole (PROTONIX) 20 MG tablet Take 1 tablet (20 mg total) by mouth daily. 12/22/16   Arby BarrettePfeiffer, Marcy, MD  QUEtiapine (SEROQUEL) 50 MG tablet Take 1 tablet (50 mg total) by mouth 3 (three) times daily. 12/22/16   Arby BarrettePfeiffer, Marcy, MD    Family History No family history on file.  Social History Social History  Substance Use Topics  . Smoking status: Current Every Day Smoker    Types: Cigarettes  . Smokeless tobacco: Never Used  . Alcohol use Yes     Allergies   Tomato   Review of Systems Review of Systems  Cardiovascular: Positive for leg swelling.  All other systems reviewed and are negative.    Physical Exam Updated Vital Signs BP 110/84   Pulse 69   Temp 98.6 F (37 C) (Oral)   Resp 18   Ht 5\' 8"  (1.727 m)   Wt (!) 145.6 kg (321 lb)   SpO2 98%   BMI 48.81 kg/m   Physical Exam  Constitutional: He appears well-developed and well-nourished.  HENT:  Head: Normocephalic.  Eyes: Pupils are equal, round, and reactive to light.  Neck: Normal range of motion.  Cardiovascular: Normal rate.  Pulmonary/Chest: Effort normal.  Musculoskeletal: He exhibits edema. He exhibits no tenderness.  Neurological: He is alert.  Skin: Skin is warm and dry.  Psychiatric: Judgment and thought content normal. His speech is delayed. He exhibits a depressed mood. He is inattentive.  Nursing note and vitals reviewed.    ED Treatments / Results  Labs (all labs ordered are listed, but only abnormal results are displayed) Labs Reviewed  COMPREHENSIVE METABOLIC PANEL - Abnormal; Notable for the following:       Result Value   Albumin 3.3 (*)    AST 46 (*)    All other components within normal limits  URINALYSIS, ROUTINE W REFLEX MICROSCOPIC - Abnormal; Notable for the  following:    Color, Urine STRAW (*)    Specific Gravity, Urine 1.002 (*)    Hgb urine dipstick SMALL (*)    All other components within normal limits  CBC WITH DIFFERENTIAL/PLATELET    EKG  EKG Interpretation None       Radiology Ct Head Wo Contrast  Result Date: 01/09/2017 CLINICAL DATA:  46 year old male with slurred speech. Initial encounter. EXAM: CT HEAD WITHOUT CONTRAST TECHNIQUE: Contiguous axial images were obtained from the base of the skull through the vertex without intravenous contrast. COMPARISON:  05/25/2013 CT.  11/17/2012 MR. FINDINGS: Brain: No intracranial hemorrhage or CT evidence of large acute infarct. No intracranial mass lesion noted on this unenhanced exam. Vascular: No hyperdense vessel. Skull: Negative. Sinuses/Orbits: No acute orbital abnormality. Opacification left frontal sinus, left ethmoid sinus mid to anterior air cells and left maxillary sinus. Opacification left ostiomeatal complex. Other: Negative. IMPRESSION: No intracranial hemorrhage or CT evidence of large acute infarct. Opacification left frontal sinus, left ethmoid sinus mid to anterior air cells and left maxillary sinus. Opacification left ostiomeatal complex. Electronically Signed   By: Lacy Duverney M.D.   On: 01/09/2017 08:58   Dg Chest Port 1 View  Result Date: 01/09/2017 CLINICAL DATA:  Hypoxia EXAM: PORTABLE CHEST 1 VIEW COMPARISON:  12/21/2016 FINDINGS: Low volume chest with artifact from EKG leads. There is no edema, consolidation, effusion, or pneumothorax. Normal heart size and mediastinal contours when allowing for mild rightward rotation. Widening of the left acromioclavicular joint appears chronic based on prior. No acute osseous finding. IMPRESSION: Negative portable chest. Electronically Signed   By: Marnee Spring M.D.   On: 01/09/2017 13:55    Procedures Procedures (including critical care time)  Medications Ordered in ED Medications  furosemide (LASIX) tablet 40 mg (40 mg Oral  Given 12/21/16 2104)  benztropine (COGENTIN) tablet 1 mg (1 mg Oral Given 12/22/16 1013)  haloperidol lactate (HALDOL) injection 10 mg (10 mg Intravenous Given 12/22/16 0950)     Initial Impression / Assessment and Plan / ED Course  I have reviewed the triage vital signs and the nursing notes.  Pertinent labs & imaging results that were available during my care of the patient were reviewed by me and considered in my medical decision making (see chart for details).    Family reports that patient is homeless, that they can no longer care for him.  They relinquished their guardianship in court today. Patient has been evicted from the homeless shelter and has no medication.  He has been assessed by social work and case management.  He will be held overnight and social work will attempt placement in the morning  Final Clinical Impressions(s) / ED Diagnoses   Final diagnoses:  Leg swelling  Bipolar affective disorder, remission status unspecified (HCC)  New Prescriptions Discharge Medication List as of 12/22/2016  7:14 PM    START taking these medications   Details  albuterol (PROVENTIL HFA;VENTOLIN HFA) 108 (90 Base) MCG/ACT inhaler Inhale 1-2 puffs into the lungs every 6 (six) hours as needed for wheezing or shortness of breath., Starting Thu 12/22/2016, Print    QUEtiapine (SEROQUEL) 50 MG tablet Take 1 tablet (50 mg total) by mouth 3 (three) times daily., Starting Thu 12/22/2016, Print         Earley Favor, NP 12/22/16 2155    Gerhard Munch, MD 12/22/16 2156    Earley Favor, NP 01/09/17 1956    Earley Favor, NP 01/09/17 1957    Earley Favor, NP 01/09/17 2006  Medical screening examination/treatment/procedure(s) were performed by non-physician practitioner and as supervising physician I was immediately available for consultation/collaboration.  Gerhard Munch, MD    Gerhard Munch, MD 02/06/17 [phone]

## 2016-12-21 NOTE — ED Triage Notes (Signed)
Patient arrived with EMS from [facility] urgent care reports worsening bilateral lower legs edema for several days , he has not taken his medications for > 1 week , denies SOB / no fever or chills . Pain increases with palpation and when walking .

## 2016-12-22 DIAGNOSIS — F319 Bipolar disorder, unspecified: Secondary | ICD-10-CM | POA: Diagnosis not present

## 2016-12-22 MED ORDER — QUETIAPINE FUMARATE 50 MG PO TABS: 50.0000 mg | ORAL_TABLET | Freq: Three times a day (TID) | ORAL | 0 refills | Status: DC

## 2016-12-22 MED ORDER — ALBUTEROL SULFATE HFA 108 (90 BASE) MCG/ACT IN AERS: 1.0000 | INHALATION_SPRAY | Freq: Four times a day (QID) | RESPIRATORY_TRACT | 0 refills | Status: DC | PRN

## 2016-12-22 MED ORDER — HALOPERIDOL LACTATE 5 MG/ML IJ SOLN
10.0000 mg | Freq: Once | INTRAMUSCULAR | Status: AC
  Administered 2016-12-22: 10 mg via INTRAVENOUS
  Filled 2016-12-22: qty 2

## 2016-12-22 MED ORDER — PANTOPRAZOLE SODIUM 20 MG PO TBEC: 20.0000 mg | DELAYED_RELEASE_TABLET | Freq: Every day | ORAL | 1 refills | Status: DC

## 2016-12-22 MED ORDER — TUBERCULIN PPD 5 UNIT/0.1ML ID SOLN
5.0000 [IU] | Freq: Once | INTRADERMAL | Status: DC
  Administered 2016-12-22: 5 [IU] via INTRADERMAL
  Filled 2016-12-22: qty 0.1

## 2016-12-22 MED ORDER — BENZTROPINE MESYLATE 1 MG PO TABS
1.0000 mg | ORAL_TABLET | Freq: Once | ORAL | Status: AC
  Administered 2016-12-22: 1 mg via ORAL
  Filled 2016-12-22: qty 1

## 2016-12-22 MED ORDER — HALOPERIDOL 5 MG PO TABS: 10.0000 mg | ORAL_TABLET | Freq: Once | ORAL | Status: DC

## 2016-12-22 MED ORDER — AMLODIPINE BESYLATE 10 MG PO TABS: 10.0000 mg | ORAL_TABLET | Freq: Every day | ORAL | 1 refills | Status: DC

## 2016-12-22 MED ORDER — FUROSEMIDE 20 MG PO TABS: 20.0000 mg | ORAL_TABLET | Freq: Every day | ORAL | 0 refills | Status: DC

## 2016-12-22 NOTE — ED Notes (Signed)
PT asleep at this time.  

## 2016-12-22 NOTE — ED Notes (Signed)
Per staffing office, ED staff will have to cover sitter

## 2016-12-22 NOTE — Clinical Social Work Note (Signed)
Clinical Social Worker received notification from evening CSW and nursing that patient was in ED awaiting placement.  CSW spoke with [facility] guardian, Domingo Sepony Flanory who states that patient sister relinquished guardianship rights yesterday and patient is now a ward of the state.  Patient guardian will likely be Domingo Sepony Flanory, however Alinda Moneyony must still obtain a court order either today or tomorrow.  Alinda Moneyony is willing to assist patient and family in finding placement, but does relay that it is not something that can happen immediately.   CSW spoke with patient brother in law outside of the room who states that patient has been at [facility] for the past three months after [place].  Per patient brother in law, patient was previously at [facility] and doing well until a hospitalization where he was put on a bus back to [place] and was then homeless.  Patient family just received notification that patient was no longer at the facility and had returned to [place].  Patient family went to [facility] and picked up patient, cleaned him up, and were working with the courts about guardianship, when patient had a medical necessity for an ED visit.  Patient family states that it is not ideal for patient to stay with them, however for short term would be agreeable.  CSW to complete FL2 and requested TB screen to send to [facility] to assist with placement.  Patient brother in law has agreed to provide patient with transportation at discharge.  Patient brother in law also verbally confirmed that patient is not an immediate danger to himself and/or others, therefore has no identifying need to remain in hospital if patient requests to leave.  CSW to fax appropriate documentation to [facility] [place] [facility] guardian, Domingo Sepony Flanory [phone] fax / [phone] phone) who will follow up with patient and family following discharge.  CSW remains available for support as needed.

## 2016-12-22 NOTE — ED Notes (Signed)
PT taken a drink. Meal ordered for PT. No further requests.

## 2016-12-22 NOTE — ED Notes (Signed)
Care handoff to Jennifer RN 

## 2016-12-22 NOTE — ED Notes (Addendum)
24 hour Emergency Certificate rescinded, pt up for discharge,.  Pt on the phone to his family "trying to get a ride".

## 2016-12-22 NOTE — ED Notes (Signed)
Meal delivered to PT at this time.

## 2016-12-22 NOTE — ED Notes (Signed)
Voicemail left for social worker

## 2016-12-22 NOTE — Clinical Social Work Note (Signed)
CSW attempted calling patient's brother-in-law at 3:17 pm regarding transportation home. Patient's brother-in-law did not answer and no voicemail was left due to automatic hang up. PM ED CSW following.  [name] CourtSarah Dashawn [name], CSW [phone]

## 2016-12-22 NOTE — ED Notes (Signed)
Pt had urine output

## 2016-12-22 NOTE — ED Provider Notes (Signed)
Pt has had SW eval. He is under 24 hold, while his POA / guardianship was being figured out. Pt is not having SI/HI. Family was going to pick him up, but no one has called - he cold be discharged if he wants to leave.  7:16 PM Pt wants to go home. He is ao x 3. He demonstrates good judgement and is certainly not decompensated. I spoke with sister [name], who reported that they wont be able to pick him up. I informed family that we dont feel [name] can be kept here against his will, as although he might not be completely psychiatrically stable, he is not unstable and doesn't show us any signs of being harmful to self. Pt is happy to leave. He requests that his meds be prescribed.    Derwood KaplanNanavati, Jabree Rebert, MD 12/22/16 [phone]

## 2016-12-22 NOTE — ED Notes (Signed)
Dr. Donnald GarrePfeiffer speaks to Drum[place]illa, RN about PT. She will contact social work

## 2016-12-22 NOTE — ED Notes (Signed)
Sitter arrives at bedside. PT snoring.

## 2016-12-22 NOTE — ED Notes (Signed)
PT sitting up in bed eating breakfast. Call bell in reach. No further requests at this time.

## 2016-12-22 NOTE — ED Notes (Signed)
PT snoring, sitter at bedside 

## 2016-12-22 NOTE — ED Notes (Signed)
ED Provider at bedside. 

## 2016-12-22 NOTE — ED Notes (Signed)
Meal ordered for PT 

## 2016-12-22 NOTE — ED Provider Notes (Signed)
Patient had been held in the emergency department overnight for social work reassessment for possible placement. He had presented as outlined in PA-C Schultz's note. Patient has been noncompliant with medications and at this time found to be homeless. Patient has history of schizophrenia and in the past has had an assigned guardian. He reportedly however has been evicted from his shelter 3 days ago.  Patient is alert and interactive. Heart regular without gross rub murmur gallop, lungs are clear without wheezes rhonchi rale. 1-2+ pitting edema bilateral lower extremities. No active wounds. No calf tenderness.  One of patient's main complaints arriving was needing his medications refilled. Family members however express significant concern for the patient and did not feel they could go on providing care. Social work was consult that and have undergone significant work and evaluation to attempt placement. At this time the process has been initiated for finding the patient another legal guardian. In the meantime, patient's sister and brother-in-law have agreed to take him to their home for several days while this process continues to be executed.   Although the patient did not have history of suicidal or homicidal intent. It was unclear to me if the patient was stable for leaving the hospital of his own accord as plan was to make him a ward of the state and potentially find another guardian for him. I also did not initially have additional history from the family members to determine if there were significant safety issues with his history of schizophrenia and possible behavior disorder thus 24-hour hold executed. Patient has been given his regular medications. Haldol 10 mg IV administered. Patient had routinely been on Haldol orally. This was administered for behavior management of schizophrenia. Although patient was not overtly hostile he did seem somewhat agitated intermittently. Haldol improved his demeanor  and level of social interaction.   At this time we're waiting family members to pick up the patient. FL 2 form has been done and patient is now waiting discharge. Prescriptions will be provided for him to continue his outpatient medications. He had previously been on Seroquel per EMR report. I will have him continue Seroquel, Norvasc, Lasix and albuterol.   Arby BarrettePfeiffer, Alexiah Koroma, MD 12/22/16 1558

## 2016-12-22 NOTE — ED Notes (Signed)
Pt becoming agitated, security called to bedside. Pt wishing to speak to the EDP. EDP aware and will go see the patient.

## 2016-12-22 NOTE — ED Notes (Signed)
PT snoring, sitter at bedside

## 2016-12-22 NOTE — ED Notes (Signed)
Pt had a total of output

## 2016-12-22 NOTE — ED Notes (Signed)
Kendal HymenBonnie, RN accepts report

## 2016-12-22 NOTE — [place] FL2 (Signed)
[place] MEDICAID FL2 LEVEL OF CARE SCREENING TOOL     IDENTIFICATION  Patient Name: [name] [name] Birthdate: 1971/07/08 Sex: male Admission Date (Current Location): 12/21/2016  [facility] and [place] Number:  Producer, television/film/video and Address:  [facility], [address] [place], [place] [address]      Provider Number: [id number]  Attending Physician Name and Address:  Default, Provider, MD  Relative Name and Phone Number:       Current Level of Care: Hospital Recommended Level of Care: Family Care Home Prior Approval Number:    Date Approved/Denied:   PASRR Number:    Discharge Plan: Other (Comment) (Family Care Home )    Current Diagnoses: Patient Active Problem List   Diagnosis Date Noted  . Paranoid schizophrenia (HCC) 04/20/2013  . Acute respiratory failure with hypoxia (HCC) 11/17/2012  . Aspiration pneumonia (HCC) 11/17/2012  . Encephalopathy acute 11/17/2012    Orientation RESPIRATION BLADDER Height & Weight     Time, Situation, Self, Place  Normal Continent Weight: (!) 321 lb (145.6 kg) Height:  5\' 8"  (172.7 cm)  BEHAVIORAL SYMPTOMS/MOOD NEUROLOGICAL BOWEL NUTRITION STATUS   (None )  (None ) Continent    AMBULATORY STATUS COMMUNICATION OF NEEDS Skin   Independent Verbally Normal                       Personal Care Assistance Level of Assistance              Functional Limitations Info  Sight, Speech, Hearing Sight Info: Adequate Hearing Info: Adequate Speech Info: Adequate    SPECIAL CARE FACTORS FREQUENCY                       Contractures Contractures Info: Not present    Additional Factors Info  Code Status, Allergies, Psychotropic Code Status Info: Full Code  Allergies Info: Tomato Psychotropic Info: Paranoid Schizophrenia: Tegretol 200mg  po bid          Current Medications (12/22/2016):  This is the current hospital active medication list Current Facility-Administered Medications   Medication Dose Route Frequency Provider Last Rate Last Dose  . amLODipine (NORVASC) tablet 10 mg  10 mg Oral Daily Earley Favor, NP   10 mg at 12/22/16 0944  . carbamazepine (TEGRETOL) tablet 200 mg  200 mg Oral BID Earley Favor, NP   200 mg at 12/22/16 1013  . furosemide (LASIX) tablet 20 mg  20 mg Oral Daily Earley Favor, NP   20 mg at 12/22/16 0944  . pantoprazole (PROTONIX) EC tablet 20 mg  20 mg Oral Daily Earley Favor, NP   20 mg at 12/22/16 1402  . tuberculin injection 5 Units  5 Units Intradermal Once Arby Barrette, MD   5 Units at 12/22/16 1403   Current Outpatient Prescriptions  Medication Sig Dispense Refill  . albuterol (PROVENTIL HFA;VENTOLIN HFA) 108 (90 Base) MCG/ACT inhaler Inhale 1-2 puffs into the lungs every 6 (six) hours as needed for wheezing or shortness of breath. 1 Inhaler 0  . amLODipine (NORVASC) 10 MG tablet Take 1 tablet (10 mg total) by mouth daily. 30 tablet 1  . carbamazepine (TEGRETOL) 200 MG tablet Take 1 tablet (200 mg total) by mouth 2 (two) times daily. (Patient not taking: Reported on 11/25/2016) 30 tablet 0  . furosemide (LASIX) 20 MG tablet Take 1 tablet (20 mg total) by mouth daily. 30 tablet 0  . pantoprazole (PROTONIX) 20 MG tablet Take  1 tablet (20 mg total) by mouth daily. 30 tablet 1  . QUEtiapine (SEROQUEL) 50 MG tablet Take 1 tablet (50 mg total) by mouth 3 (three) times daily. 90 tablet 0     Discharge Medications: Please see discharge summary for a list of discharge medications.  Relevant Imaging Results:  Relevant Lab Results:   Additional Information SS # [id number]  Margarito LinerSarah C Imberly Troxler, LCSW

## 2016-12-22 NOTE — ED Notes (Signed)
Per Dr. Donnald GarrePfeiffer, PT may keep his clothing and belongings.

## 2016-12-23 DIAGNOSIS — F319 Bipolar disorder, unspecified: Secondary | ICD-10-CM | POA: Diagnosis not present

## 2016-12-23 MED FILL — FUROSEMIDE 20 MG TABLET: 20 | 30 days supply | Qty: 30 | Fill #0

## 2016-12-23 MED FILL — carBAMazepine 200 MG TABS: 200 | 30 days supply | Qty: 60 | Fill #1

## 2016-12-23 MED FILL — PANTOPRAZOLE SOD DR 20 MG T: 20 | 30 days supply | Qty: 30 | Fill #0

## 2016-12-23 MED FILL — VENTOLIN HFA 90 MCG INHALER: 108 (90 BAS | 17 days supply | Qty: 18 | Fill #0

## 2016-12-23 MED FILL — AMLODIPINE BESYLATE 10 MG T: 10 | 30 days supply | Qty: 30 | Fill #0

## 2016-12-23 MED FILL — QUETIAPINE FUMARATE 50 MG T: 50 | 30 days supply | Qty: 90 | Fill #0

## 2016-12-23 NOTE — ED Notes (Signed)
Sister called to come pick up patient.  

## 2016-12-23 NOTE — ED Notes (Signed)
Sister still saying she cant take patient because she cant get his prescriptions. Social work contacted

## 2016-12-23 NOTE — ED Notes (Signed)
Pt eating breakfast 

## 2016-12-23 NOTE — ED Notes (Signed)
Pt given prescriptions with education about [facility] for picking up prescriptions. Pt ambulatory at DC.

## 2016-12-23 NOTE — ED Notes (Signed)
Pt sister [name] will pick patient up at noon.

## 2016-12-23 NOTE — ED Notes (Signed)
[name] number [phone]

## 2016-12-23 NOTE — ED Notes (Signed)
Attempted to call pt sister. Previous RN stated sister was to pick pt up at 12:00. Brayton CavesJessie with social work attempting to find pt placement.

## 2016-12-23 NOTE — ED Notes (Signed)
Family called and made aware that pt will be sitting in waiting room. Sister reports that she will be picking up pt around 1415.

## 2016-12-25 ENCOUNTER — Emergency Department (HOSPITAL_COMMUNITY)
Admission: EM | Admit: 2016-12-25 | Discharge: 2016-12-25 | Disposition: A | Discharge status: Home / Self Care | Payer: Medicare Other | Attending: Emergency Medicine | Admitting: Emergency Medicine

## 2016-12-25 ENCOUNTER — Ambulatory Visit (HOSPITAL_COMMUNITY): Payer: Self-pay

## 2016-12-25 ENCOUNTER — Emergency Department (HOSPITAL_COMMUNITY): Payer: Self-pay

## 2016-12-25 DIAGNOSIS — Z5321 Procedure and treatment not carried out due to patient leaving prior to being seen by health care provider: Secondary | ICD-10-CM

## 2016-12-25 DIAGNOSIS — F10929 Alcohol use, unspecified with intoxication, unspecified: Secondary | ICD-10-CM | POA: Diagnosis present
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
## 2016-12-25 NOTE — ED Provider Notes (Signed)
Patient left without being seen  1. Patient left without being seen       Melene PlanFloyd, Connell Bognar, DO 12/25/16 [phone]

## 2017-01-09 ENCOUNTER — Encounter (HOSPITAL_COMMUNITY): Payer: Self-pay

## 2017-01-09 ENCOUNTER — Emergency Department (HOSPITAL_COMMUNITY): Payer: Medicare Other

## 2017-01-09 ENCOUNTER — Observation Stay (HOSPITAL_COMMUNITY)
Admission: EM | Admit: 2017-01-09 | Discharge: 2017-01-10 | Discharge status: Against Medical Advice | Payer: Medicare Other | Attending: Internal Medicine | Admitting: Internal Medicine

## 2017-01-09 DIAGNOSIS — R400 Somnolence: Secondary | ICD-10-CM | POA: Diagnosis present

## 2017-01-09 DIAGNOSIS — E669 Obesity, unspecified: Secondary | ICD-10-CM | POA: Insufficient documentation

## 2017-01-09 DIAGNOSIS — F1210 Cannabis abuse, uncomplicated: Secondary | ICD-10-CM | POA: Diagnosis not present

## 2017-01-09 DIAGNOSIS — R4182 Altered mental status, unspecified: Secondary | ICD-10-CM | POA: Diagnosis present

## 2017-01-09 DIAGNOSIS — J9602 Acute respiratory failure with hypercapnia: Secondary | ICD-10-CM | POA: Diagnosis not present

## 2017-01-09 DIAGNOSIS — Z590 Homelessness: Secondary | ICD-10-CM | POA: Diagnosis not present

## 2017-01-09 DIAGNOSIS — G9340 Encephalopathy, unspecified: Secondary | ICD-10-CM | POA: Diagnosis not present

## 2017-01-09 DIAGNOSIS — F1010 Alcohol abuse, uncomplicated: Secondary | ICD-10-CM | POA: Diagnosis not present

## 2017-01-09 DIAGNOSIS — F319 Bipolar disorder, unspecified: Secondary | ICD-10-CM | POA: Diagnosis not present

## 2017-01-09 DIAGNOSIS — F200 Paranoid schizophrenia: Secondary | ICD-10-CM | POA: Diagnosis present

## 2017-01-09 DIAGNOSIS — G9341 Metabolic encephalopathy: Secondary | ICD-10-CM | POA: Insufficient documentation

## 2017-01-09 DIAGNOSIS — G4733 Obstructive sleep apnea (adult) (pediatric): Secondary | ICD-10-CM | POA: Diagnosis not present

## 2017-01-09 DIAGNOSIS — F1410 Cocaine abuse, uncomplicated: Secondary | ICD-10-CM | POA: Diagnosis not present

## 2017-01-09 DIAGNOSIS — I10 Essential (primary) hypertension: Secondary | ICD-10-CM | POA: Diagnosis not present

## 2017-01-09 DIAGNOSIS — R4781 Slurred speech: Secondary | ICD-10-CM | POA: Diagnosis not present

## 2017-01-09 DIAGNOSIS — R410 Disorientation, unspecified: Secondary | ICD-10-CM

## 2017-01-09 DIAGNOSIS — Z6841 Body Mass Index (BMI) 40.0 and over, adult: Secondary | ICD-10-CM | POA: Diagnosis not present

## 2017-01-09 DIAGNOSIS — R2681 Unsteadiness on feet: Secondary | ICD-10-CM | POA: Insufficient documentation

## 2017-01-09 DIAGNOSIS — K219 Gastro-esophageal reflux disease without esophagitis: Secondary | ICD-10-CM | POA: Diagnosis not present

## 2017-01-09 DIAGNOSIS — R0902 Hypoxemia: Secondary | ICD-10-CM

## 2017-01-09 DIAGNOSIS — F17210 Nicotine dependence, cigarettes, uncomplicated: Secondary | ICD-10-CM | POA: Insufficient documentation

## 2017-01-09 LAB — I-STAT ARTERIAL BLOOD GAS, ED
ACID-BASE EXCESS: 1 mmol/L (ref 0.0–2.0)
Bicarbonate: 27.9 mmol/L (ref 20.0–28.0)
O2 SAT: 97 %
PCO2 ART: 54.8 mmHg — AB (ref 32.0–48.0)
PH ART: 7.315 — AB (ref 7.350–7.450)
TCO2: 30 mmol/L (ref 0–100)
pO2, Arterial: 97 mmHg (ref 83.0–108.0)

## 2017-01-09 LAB — RAPID URINE DRUG SCREEN, HOSP PERFORMED
Amphetamines: NOT DETECTED
BARBITURATES: NOT DETECTED
Benzodiazepines: NOT DETECTED
Cocaine: POSITIVE — AB
Opiates: NOT DETECTED
Tetrahydrocannabinol: POSITIVE — AB

## 2017-01-09 LAB — COMPREHENSIVE METABOLIC PANEL
ALBUMIN: 3.6 g/dL (ref 3.5–5.0)
ALK PHOS: 65 U/L (ref 38–126)
ALT: 16 U/L — AB (ref 17–63)
ANION GAP: 8 (ref 5–15)
AST: 24 U/L (ref 15–41)
BILIRUBIN TOTAL: 0.5 mg/dL (ref 0.3–1.2)
BUN: 14 mg/dL (ref 6–20)
CALCIUM: 8.9 mg/dL (ref 8.9–10.3)
CO2: 23 mmol/L (ref 22–32)
CREATININE: 0.88 mg/dL (ref 0.61–1.24)
Chloride: 106 mmol/L (ref 101–111)
GFR calc non Af Amer: >60 mL/min (ref >60)
GLUCOSE: 94 mg/dL (ref 65–99)
Potassium: 3.8 mmol/L (ref 3.5–5.1)
Sodium: 137 mmol/L (ref 135–145)
TOTAL PROTEIN: 6.7 g/dL (ref 6.5–8.1)

## 2017-01-09 LAB — MRSA PCR SCREENING: MRSA by PCR: NEGATIVE

## 2017-01-09 LAB — CBC
HCT: 43.4 % (ref 39.0–52.0)
HEMOGLOBIN: 13.5 g/dL (ref 13.0–17.0)
MCH: 26.9 pg (ref 26.0–34.0)
MCHC: 31.1 g/dL (ref 30.0–36.0)
MCV: 86.5 fL (ref 78.0–100.0)
Platelets: 153 10*3/uL (ref 150–400)
RBC: 5.02 MIL/uL (ref 4.22–5.81)
RDW: 16.4 % — ABNORMAL HIGH (ref 11.5–15.5)
WBC: 10.8 10*3/uL — ABNORMAL HIGH (ref 4.0–10.5)

## 2017-01-09 LAB — AMMONIA: Ammonia: 38 umol/L — ABNORMAL HIGH (ref 9–35)

## 2017-01-09 LAB — CBG MONITORING, ED: Glucose-Capillary: 89 mg/dL (ref 65–99)

## 2017-01-09 LAB — ACETAMINOPHEN LEVEL: Acetaminophen (Tylenol), Serum: <10 ug/mL — ABNORMAL LOW (ref 10–30)

## 2017-01-09 LAB — ETHANOL: Alcohol, Ethyl (B): <5 mg/dL (ref <5)

## 2017-01-09 MED ORDER — CHLORHEXIDINE GLUCONATE 0.12 % MT SOLN
15.0000 mL | Freq: Two times a day (BID) | OROMUCOSAL | Status: DC
  Administered 2017-01-10: 15 mL via OROMUCOSAL
  Filled 2017-01-09 (×2): qty 15

## 2017-01-09 MED ORDER — AMLODIPINE BESYLATE 5 MG PO TABS
10.0000 mg | ORAL_TABLET | Freq: Every day | ORAL | Status: DC
  Administered 2017-01-09 – 2017-01-10 (×2): 10 mg via ORAL
  Filled 2017-01-09 (×2): qty 2

## 2017-01-09 MED ORDER — NALOXONE HCL 0.4 MG/ML IJ SOLN
INTRAMUSCULAR | Status: AC
  Administered 2017-01-09: 0.4 mg
  Filled 2017-01-09: qty 1

## 2017-01-09 MED ORDER — ACETAMINOPHEN 650 MG RE SUPP: 650.0000 mg | Freq: Four times a day (QID) | RECTAL | Status: DC | PRN

## 2017-01-09 MED ORDER — THIAMINE HCL 100 MG/ML IJ SOLN
Freq: Once | INTRAVENOUS | Status: AC
  Administered 2017-01-09: 19:00:00 via INTRAVENOUS
  Filled 2017-01-09: qty 1000

## 2017-01-09 MED ORDER — POLYETHYLENE GLYCOL 3350 17 G PO PACK: 17.0000 g | PACK | Freq: Every day | ORAL | Status: DC | PRN

## 2017-01-09 MED ORDER — ACETAMINOPHEN 325 MG PO TABS: 650.0000 mg | ORAL_TABLET | Freq: Four times a day (QID) | ORAL | Status: DC | PRN

## 2017-01-09 MED ORDER — ORAL CARE MOUTH RINSE: 15.0000 mL | Freq: Two times a day (BID) | OROMUCOSAL | Status: DC

## 2017-01-09 MED ORDER — SODIUM CHLORIDE 0.9% FLUSH
3.0000 mL | Freq: Two times a day (BID) | INTRAVENOUS | Status: DC
  Administered 2017-01-10: 3 mL via INTRAVENOUS

## 2017-01-09 MED ORDER — NALOXONE HCL 2 MG/2ML IJ SOSY
2.0000 mg | PREFILLED_SYRINGE | Freq: Once | INTRAMUSCULAR | Status: AC
  Administered 2017-01-09: 2 mg via INTRAVENOUS
  Filled 2017-01-09: qty 2

## 2017-01-09 MED ORDER — QUETIAPINE FUMARATE 50 MG PO TABS
50.0000 mg | ORAL_TABLET | Freq: Three times a day (TID) | ORAL | Status: DC
  Administered 2017-01-09 – 2017-01-10 (×2): 50 mg via ORAL
  Filled 2017-01-09 (×2): qty 1

## 2017-01-09 MED ORDER — SODIUM CHLORIDE 0.9% FLUSH: 3.0000 mL | Freq: Two times a day (BID) | INTRAVENOUS | Status: DC

## 2017-01-09 MED ORDER — ONDANSETRON HCL 4 MG/2ML IJ SOLN: 4.0000 mg | Freq: Four times a day (QID) | INTRAMUSCULAR | Status: DC | PRN

## 2017-01-09 MED ORDER — PANTOPRAZOLE SODIUM 20 MG PO TBEC
20.0000 mg | DELAYED_RELEASE_TABLET | Freq: Every day | ORAL | Status: DC
Start: 2017-01-09 — End: 2017-01-10
  Administered 2017-01-09 – 2017-01-10 (×2): 20 mg via ORAL
  Filled 2017-01-09 (×2): qty 1

## 2017-01-09 MED ORDER — AMMONIA AROMATIC IN INHA
0.3000 mL | Freq: Once | RESPIRATORY_TRACT | Status: AC
  Administered 2017-01-09: 0.3 mL via RESPIRATORY_TRACT

## 2017-01-09 MED ORDER — ENOXAPARIN SODIUM 40 MG/0.4ML ~~LOC~~ SOLN
40.0000 mg | SUBCUTANEOUS | Status: DC
  Administered 2017-01-09: 40 mg via SUBCUTANEOUS
  Filled 2017-01-09 (×2): qty 0.4

## 2017-01-09 MED ORDER — SODIUM CHLORIDE 0.9% FLUSH: 3.0000 mL | INTRAVENOUS | Status: DC | PRN

## 2017-01-09 MED ORDER — KETOROLAC TROMETHAMINE 30 MG/ML IJ SOLN: 30.0000 mg | Freq: Four times a day (QID) | INTRAMUSCULAR | Status: DC | PRN

## 2017-01-09 MED ORDER — ONDANSETRON HCL 4 MG PO TABS: 4.0000 mg | ORAL_TABLET | Freq: Four times a day (QID) | ORAL | Status: DC | PRN

## 2017-01-09 MED ORDER — SODIUM CHLORIDE 0.9 % IV SOLN: 250.0000 mL | INTRAVENOUS | Status: DC | PRN

## 2017-01-09 NOTE — ED Notes (Signed)
Pt removed bipap mask while rolling around in bed, O2 sat dropped into the 60s, pt was placed back on bipap, O2 sat up to 100%

## 2017-01-09 NOTE — ED Notes (Signed)
Pt thrashing at attempts at placing nasal airway

## 2017-01-09 NOTE — ED Notes (Signed)
RT at bedside, ABG collected. Pt non responsive to needle stick
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
## 2017-01-09 NOTE — Progress Notes (Signed)
Placed pt on BiPAP per order.

## 2017-01-09 NOTE — ED Notes (Signed)
Pt aware that urine sample is needed.  

## 2017-01-09 NOTE — ED Notes (Signed)
Patient transported to CT 

## 2017-01-09 NOTE — Progress Notes (Signed)
Patient on nasal cannula resting at this time. No distress noted. Vitals stable. RT will continue to monitor.
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
## 2017-01-09 NOTE — ED Notes (Signed)
RT at bedside to place patient on bipap 

## 2017-01-09 NOTE — ED Notes (Signed)
Pt placed on 4L by [place],RT called for ABG

## 2017-01-09 NOTE — ED Notes (Signed)
Ebbie Ridgehris Lawyer, [place] aware of patients O2 sat dropping into the high 80s.

## 2017-01-09 NOTE — Progress Notes (Signed)
Reported patient to Kohl'sCarol RN

## 2017-01-09 NOTE — Progress Notes (Signed)
Patient arrived to room from ED with RN and RT. Patient immediately responded when I called his name, allert and oriented X3 and then he asked me what time it was. He requested food, MD paged and advised of patient disposition and request of food. Oxygen sats 100% on bipap.  Patient refused full CHG bath, skin assessment, and would not remove his pants or allow staff to remove. Will continue to monitor very closely.

## 2017-01-09 NOTE — ED Notes (Signed)
Dr. Preston FleetingGlick at bedside, pt continues to desat without constant tactile stimulation. Loud snoring noted. Narcan .4 then 2mg  given IV without significant improvement.

## 2017-01-10 DIAGNOSIS — K219 Gastro-esophageal reflux disease without esophagitis: Secondary | ICD-10-CM | POA: Diagnosis not present

## 2017-01-10 DIAGNOSIS — R0902 Hypoxemia: Secondary | ICD-10-CM | POA: Diagnosis not present

## 2017-01-10 DIAGNOSIS — E662 Morbid (severe) obesity with alveolar hypoventilation: Secondary | ICD-10-CM | POA: Diagnosis not present

## 2017-01-10 DIAGNOSIS — J9602 Acute respiratory failure with hypercapnia: Secondary | ICD-10-CM

## 2017-01-10 DIAGNOSIS — F200 Paranoid schizophrenia: Secondary | ICD-10-CM

## 2017-01-10 DIAGNOSIS — G9340 Encephalopathy, unspecified: Secondary | ICD-10-CM | POA: Diagnosis not present

## 2017-01-10 DIAGNOSIS — G4733 Obstructive sleep apnea (adult) (pediatric): Secondary | ICD-10-CM

## 2017-01-10 DIAGNOSIS — I10 Essential (primary) hypertension: Secondary | ICD-10-CM | POA: Diagnosis not present

## 2017-01-10 LAB — BASIC METABOLIC PANEL
Anion gap: 6 (ref 5–15)
BUN: 11 mg/dL (ref 6–20)
CALCIUM: 8.2 mg/dL — AB (ref 8.9–10.3)
CHLORIDE: 105 mmol/L (ref 101–111)
CO2: 28 mmol/L (ref 22–32)
CREATININE: 0.75 mg/dL (ref 0.61–1.24)
GFR calc non Af Amer: >60 mL/min (ref >60)
Glucose, Bld: 87 mg/dL (ref 65–99)
Potassium: 3.9 mmol/L (ref 3.5–5.1)
SODIUM: 139 mmol/L (ref 135–145)

## 2017-01-10 LAB — HIV ANTIBODY (ROUTINE TESTING W REFLEX): HIV SCREEN 4TH GENERATION: NONREACTIVE

## 2017-01-10 MED ORDER — CARBAMAZEPINE ER 200 MG PO CP12: 200.0000 mg | ORAL_CAPSULE | Freq: Two times a day (BID) | ORAL | Status: DC

## 2017-01-10 MED ORDER — QUETIAPINE FUMARATE 50 MG PO TABS: 50.0000 mg | ORAL_TABLET | Freq: Two times a day (BID) | ORAL | Status: DC

## 2017-01-10 MED ORDER — NICOTINE 21 MG/24HR TD PT24: 21.0000 mg | MEDICATED_PATCH | Freq: Every day | TRANSDERMAL | Status: DC

## 2017-01-10 MED ORDER — CARBAMAZEPINE ER 200 MG PO TB12
200.0000 mg | ORAL_TABLET | Freq: Two times a day (BID) | ORAL | Status: DC
  Filled 2017-01-10: qty 1

## 2017-01-10 NOTE — Progress Notes (Signed)
Patient leaving AMA. Notified MD. Patient seems to be responding to internal stimuli. Patient was looking to his right while I was in front of him and yelling towards the right with profanity. Patient would talk to me but needed to be redirected to look at me for him to be able to hear what I was saying and answer questions. I am trying to convince patient to stay to talk with LCSW and MD. Patient reports he is only able to stay a few more minutes.

## 2017-01-10 NOTE — Evaluation (Addendum)
Physical Therapy Evaluation Patient Details Name: [name] [name] MRN: [id number] DOB: 1970-07-17 Today's Date: 01/10/2017   History of Present Illness  Pt is a 46 yo brought to the ER after being found by police slumped up against a building, given Narcan dx with acute respiratory failure with hypercapnia. PMH for schizophrenia, bipolar disorder, and HTN.   Clinical Impression  Pt admitted with above diagnosis. Pt currently with functional limitations due to the deficits listed below (see PT Problem List). Pt is independent with bed mobility and minA for transfers and ambulation of 15 feet. Pt is unsteady on his feet but at this time refuses any assistance. Pt is limited by his underlying psychological issues. Pt will benefit from skilled PT to increase their independence and safety with mobility to allow discharge to the venue listed below.       Follow Up Recommendations No PT follow up    Equipment Recommendations  None recommended by PT       Precautions / Restrictions Precautions Precautions: Fall Restrictions Weight Bearing Restrictions: No      Mobility  Bed Mobility Overal bed mobility: Independent                Transfers Overall transfer level: Needs assistance Equipment used: None;Rolling walker (2 wheeled) Transfers: Sit to/from Stand Sit to Stand: Min guard         General transfer comment: pt sit>stand without AD and then sat back down due to feeling dizzy, pt willing to stand again with RW and able to steady himself, pt utilized hand rails in bathroom to pull himself up, at the recliner pt did nothing to control his descent in to the chair, landing hard on the seat  Ambulation/Gait Ambulation/Gait assistance: Min guard;Min assist Ambulation Distance (Feet): 15 Feet Assistive device: Rolling walker (2 wheeled) Gait Pattern/deviations: Step-through pattern;Staggering left;Staggering right;Trunk flexed;Wide base of support Gait velocity: slowed Gait  velocity interpretation: Below normal speed for age/gender General Gait Details: minA for steadying for ambulation in to the bathroom, after using the toilet pt asked not to be touched walking back to recliner, pt utilized RW, sink and bedside tray to steady himself walking to the recliner, pt not steady on his feet but did not experience any loss of balance, pt not able to follow any vc for safer ambulation      Balance Overall balance assessment: Needs assistance Sitting-balance support: Feet supported;No upper extremity supported Sitting balance-Leahy Scale: Fair     Standing balance support: During functional activity Standing balance-Leahy Scale: Fair Standing balance comment: able to stand without support to pull up pants and fasten them                             Pertinent Vitals/Pain Pain Assessment: Faces Pain Score: 0-No pain  SaO2 on RA throughout session >95%O2, at rest HR 89 bpm, after activity HR 118 bpm    Home Living Family/patient expects to be discharged to:: Shelter/Homeless                      Prior Function Level of Independence: Independent         Comments: ambulates without device,      Hand Dominance        Extremity/Trunk Assessment   Upper Extremity Assessment Upper Extremity Assessment: Generalized weakness    Lower Extremity Assessment Lower Extremity Assessment: Generalized weakness    Cervical / Trunk Assessment Cervical /  Trunk Assessment: Normal  Communication   Communication: Receptive difficulties;Expressive difficulties;Other (comment) (talks to people not present in the room)  Cognition Arousal/Alertness: Awake/alert Behavior During Therapy: Restless;Agitated;Anxious;Impulsive Overall Cognitive Status: Difficult to assess                                        General Comments General comments (skin integrity, edema, etc.): pt began cursing and yelling when in the bathroom directed to  someone who was not there, after toileting pt stated that he wanted to leave immediately while continuing to yell at someone, nursing and doctor advised        Assessment/Plan    PT Assessment Patient needs continued PT services  PT Problem List Decreased coordination;Decreased safety awareness;Decreased knowledge of use of DME;Decreased activity tolerance;Decreased balance;Decreased mobility       PT Treatment Interventions DME instruction;Functional mobility training;Therapeutic activities;Gait training;Therapeutic exercise;Balance training;Patient/family education    PT Goals (Current goals can be found in the Care Plan section)  Acute Rehab PT Goals Patient Stated Goal: "get out of here now" PT Goal Formulation: Patient unable to participate in goal setting Time For Goal Achievement: 01/17/17 Potential to Achieve Goals: Poor    Frequency Min 3X/week   Barriers to discharge Other (comment) (homeless)         AM-PAC PT "6 Clicks" Daily Activity  Outcome Measure Difficulty turning over in bed (including adjusting bedclothes, sheets and blankets)?: A Little Difficulty moving from lying on back to sitting on the side of the bed? : A Little Difficulty sitting down on and standing up from a chair with arms (e.g., wheelchair, bedside commode, etc,.)?: A Little Help needed moving to and from a bed to chair (including a wheelchair)?: A Little Help needed walking in hospital room?: A Little Help needed climbing 3-5 steps with a railing? : A Lot 6 Click Score: 17    End of Session Equipment Utilized During Treatment: Gait belt Activity Tolerance: Treatment limited secondary to agitation Patient left: in chair;with chair alarm set Nurse Communication: Mobility status;Other (comment) (pt's desire to leave) PT Visit Diagnosis: Unsteadiness on feet (R26.81);Other abnormalities of gait and mobility (R26.89);Other symptoms and signs involving the nervous system (R29.898);Difficulty in  walking, not elsewhere classified (R26.2)    Time: 0102-7253 PT Time Calculation (min) (ACUTE ONLY): 27 min   Charges:   PT Evaluation $PT Eval Moderate Complexity: 1 Procedure PT Treatments $Therapeutic Activity: 8-22 mins   PT G Codes:   PT G-Codes **NOT FOR INPATIENT CLASS** Functional Assessment Tool Used: AM-PAC 6 Clicks Basic Mobility Functional Limitation: Mobility: Walking and moving around Mobility: Walking and Moving Around Current Status (G6440): At least 40 percent but less than 60 percent impaired, limited or restricted Mobility: Walking and Moving Around Goal Status [phone]): At least 1 percent but less than 20 percent impaired, limited or restricted    Lanora Manis B. Beverely Risen PT, DPT Acute Rehabilitation  [phone] Pager [phone]    [name] [name] Fleet 01/10/2017, 10:21 AM

## 2017-01-10 NOTE — Discharge Summary (Signed)
Physician Discharge Summary  [name] [name] ZOX:[id number] DOB: 1970/12/06 DOA: 01/09/2017  PCP: Fleet Contras, MD  Admit date: 01/09/2017 Discharge date: 01/10/2017  Time spent: 30 minutes  Recommendations for Outpatient Follow-up:  1. Needs close follow up with psychiatry  2. Will benefit of sleep study and initiation of CPAP or BIPAP at bedtime everyday    Discharge Diagnoses:  Principal Problem:   Acute respiratory failure with hypercapnia (HCC) Active Problems:   Encephalopathy acute   Paranoid schizophrenia (HCC)   Somnolence OHS/OSA Polysubstance abuse   Discharge Condition: patient left AMA. No medications and or instructions were able to be provided, others that what was discussed during assessment of his individual conditions.   Filed Weights   01/09/17 1730  Weight: (!) 140.5 kg (309 lb 11.9 oz)    Brief History of present illness:  46 y/o with HTN, obesity, bipolar disorder, schizophrenia, GERD and polysubstance abuse; presented with AMS. Found to have acute resp failure with hypercapnia and positive UDS for cocaine/marijuana. Patient actively with acute schizophrenic features; but feeling better overall and demanding to be discharge.  Hospital Course:  1-metabolic encephalopathy: dude to hypercapnia and use of recreational drugs. -UDS positive for cocaine and marijuana -no signs of infection -much improvement in overall mentation after use of BIPAP -still with somnolence and if not physically motivated or conversationally engaged he falls a sleep. Patient was constantly snoring. -plans was to minimize sedative medications; patient educated/encourage to quit recreational drugs. -will need sleep study and CPAP or BIPAP as an outpatient. -patient left AMA  2-schizophrenia/homelessness   -stat psych consult requested; but patient left AMA -unsure of mental stability or baseline; even not agitation or hostile behavior appreciated. Following commands and not expressing  SI or homicidal ideation. -SW was also contacted and patient planning to [facility] [address]   -he was having some active schizophrenic features (nurse found him talking to people that were not present in the room) -he would continue Seroquel and carbamazepine as recently prescribed by ED visit. -no prescription provided by me as he left AMA and w/o been evaluated by psych.  3-polysubstance abuse: to tobacco, marijuana and cocaine -UDS positive (for cocaine and marijuana) -cessation counseling provided -patient not receptive to quit currently and in fact. -a nicoderm patch was ordered -patient left AMA  4-HIV screening -HIV antibody screen 4th generation was negative  5-hx of alcohol abuse -ETOH level < 5  -he was started on thiamine and folic acid -was receiving PRN CIWA monitoring  -no signs of withdrawal -left AMA  6-GERD -continue PPI  7-HTN -patient encourage to be compliant with his norvasc  8-Obesity -Body mass index is 47.1 kg/m. -low calorie diet and weight loss encourage  9-OHS/OSA -patient with positive ABG demonstrating hypercapnia -also with body habitus, increase daily sleepiness and snoring, suggesting OSA -will need sleep study/split night as an outpatient after discharge and will benefit of set up arrangements for BIPAP or CPAP QHS  Procedures: See below for x-ray reports   Consultations:  Psych   Discharge Exam: Vitals:   01/10/17 0338 01/10/17 0729  BP: 119/84 120/74  Pulse: 65 79  Resp:  18  Temp: 98.1 F (36.7 C) 98.6 F (37 C)    General: obese, in no respiratory distress 46 male. Who is also afebrile, denying CP and asking to be discharge. Patient intermittently falling sleep and snoring during interview. He was overall more alert on exam and participated with PT. Patient with some schizophrenic features (  talking to people not present in the room and coursing to them). Patient was Oriented and just not  remembering what brought him into the hospital. not showing agitation or hostile behavior.  Cardiovascular: S1 and s2, no rubs, no gallops, no JVD, no murmurs  Respiratory: good air movement, no wheezing, no crackles   Abdomen: obese, soft, NT, ND, positive BS  Musculoskeletal: trace to 1 plus edema bilaterally, no cyanosis or clubbing     Discharge Instructions    Discharge Medication List as of 01/10/2017 10:48 AM    CONTINUE these medications which have NOT CHANGED   Details  albuterol (PROVENTIL HFA;VENTOLIN HFA) 108 (90 Base) MCG/ACT inhaler Inhale 1-2 puffs into the lungs every 6 (six) hours as needed for wheezing or shortness of breath., Starting Thu 12/22/2016, Print    amLODipine (NORVASC) 10 MG tablet Take 1 tablet (10 mg total) by mouth daily., Starting Thu 12/22/2016, Print    carbamazepine (TEGRETOL) 200 MG tablet Take 1 tablet (200 mg total) by mouth 2 (two) times daily., Starting Fri 09/02/2016, Print    furosemide (LASIX) 20 MG tablet Take 1 tablet (20 mg total) by mouth daily., Starting Thu 12/22/2016, Print    pantoprazole (PROTONIX) 20 MG tablet Take 1 tablet (20 mg total) by mouth daily., Starting Thu 12/22/2016, Print    QUEtiapine (SEROQUEL) 50 MG tablet Take 1 tablet (50 mg total) by mouth 3 (three) times daily., Starting Thu 12/22/2016, Print       Allergies  Allergen Reactions  . Tomato Other (See Comments)    unknown    The results of significant diagnostics from this hospitalization (including imaging, microbiology, ancillary and laboratory) are listed below for reference.    Significant Diagnostic Studies: Dg Chest 2 View  Result Date: 12/21/2016 CLINICAL DATA:  Acute onset of bilateral leg swelling and productive cough. Initial encounter. EXAM: CHEST  2 VIEW COMPARISON:  Chest radiograph performed 09/05/2013 FINDINGS: The lungs are well-aerated. Mild peribronchial thickening is noted. There is no evidence of focal opacification, pleural effusion or  pneumothorax. The heart is normal in size; the mediastinal contour is within normal limits. No acute osseous abnormalities are seen. IMPRESSION: Mild peribronchial thickening noted.  Lungs otherwise grossly clear. Electronically Signed   By: Roanna RaiderJeffery  Chang M.D.   On: 12/21/2016 21:47   Ct Head Wo Contrast  Result Date: 01/09/2017 CLINICAL DATA:  46 year old male with slurred speech. Initial encounter. EXAM: CT HEAD WITHOUT CONTRAST TECHNIQUE: Contiguous axial images were obtained from the base of the skull through the vertex without intravenous contrast. COMPARISON:  05/25/2013 CT.  11/17/2012 MR. FINDINGS: Brain: No intracranial hemorrhage or CT evidence of large acute infarct. No intracranial mass lesion noted on this unenhanced exam. Vascular: No hyperdense vessel. Skull: Negative. Sinuses/Orbits: No acute orbital abnormality. Opacification left frontal sinus, left ethmoid sinus mid to anterior air cells and left maxillary sinus. Opacification left ostiomeatal complex. Other: Negative. IMPRESSION: No intracranial hemorrhage or CT evidence of large acute infarct. Opacification left frontal sinus, left ethmoid sinus mid to anterior air cells and left maxillary sinus. Opacification left ostiomeatal complex. Electronically Signed   By: Lacy DuverneySteven  Olson M.D.   On: 01/09/2017 08:58   Dg Chest Port 1 View  Result Date: 01/09/2017 CLINICAL DATA:  Hypoxia EXAM: PORTABLE CHEST 1 VIEW COMPARISON:  12/21/2016 FINDINGS: Low volume chest with artifact from EKG leads. There is no edema, consolidation, effusion, or pneumothorax. Normal heart size and mediastinal contours when allowing for mild rightward rotation. Widening of the left acromioclavicular  joint appears chronic based on prior. No acute osseous finding. IMPRESSION: Negative portable chest. Electronically Signed   By: Marnee Spring M.D.   On: 01/09/2017 13:55    Microbiology: Recent Results (from the past 240 hour(s))  MRSA PCR Screening     Status: None    Collection Time: 01/09/17  5:58 PM  Result Value Ref Range Status   MRSA by PCR NEGATIVE NEGATIVE Final    Comment:        The GeneXpert MRSA Assay (FDA approved for NASAL specimens only), is one component of a comprehensive MRSA colonization surveillance program. It is not intended to diagnose MRSA infection nor to guide or monitor treatment for MRSA infections.      Labs: Basic Metabolic Panel:  Recent Labs Lab 01/09/17 0636 01/10/17 0222  NA 137 139  K 3.8 3.9  CL 106 105  CO2 23 28  GLUCOSE 94 87  BUN 14 11  CREATININE 0.88 0.75  CALCIUM 8.9 8.2*   Liver Function Tests:  Recent Labs Lab 01/09/17 0636  AST 24  ALT 16*  ALKPHOS 65  BILITOT 0.5  PROT 6.7  ALBUMIN 3.6    Recent Labs Lab 01/09/17 0736  AMMONIA 38*   CBC:  Recent Labs Lab 01/09/17 0636  WBC 10.8*  HGB 13.5  HCT 43.4  MCV 86.5  PLT 153   CBG:  Recent Labs Lab 01/09/17 0621  GLUCAP 89    Signed:  Vassie Loll MD.  [facility] 01/10/2017, 4:57 PM

## 2017-01-10 NOTE — Progress Notes (Signed)
[facility] PROGRESS NOTE  [name] [name] [id number] DOB: 1971/01/28 DOA: 01/09/2017 PCP: Fleet ContrasAvbuere, Edwin, MD  Interim summary and HPI 46 y/o with HTN, obesity, bipolar disorder, schizophrenia, GERD and polysubstance abuse; presented with AMS. Found to have acute resp failure with hypercapnia and positive UDS for cocaine/marijuana. Patient actively with acute schizophrenic features; but feeling better overall and demanding to be discharge.  Assessment/Plan: 1-metabolic encephalopathy: dude to hypercapnia and use of recreational drugs. -UDS positive for cocaine and marijuana -no signs of infection -some improvement in overall mentation after use of BIPAP -still with somnolence and if not physically motivated or conversationally engaged he falls a sleep. Patient constantly snoring. -will continue supportive care -minimize sedative medications; patient educated/encourage to quit recreational drugs.  2-schizophrenia/homelessness   -stat psych consult requested -unsure of mental stability  -actively having schizophrenic features -continue Seroquel and carbamazepine   3-polysubstance abuse -tobacco, marijuana and cocaine -UDS positive -cessation counseling provided -patient not receptive to quit currently. -will use nicoderm patch   4-HIV screening -HIV antibody screen 4th generation negative  5-hx of alcohol abuse -ETOH level < 5  -will start thiamine and folic acid -PRN CIWA  6-GERD -continue PPI  7-HTN -will continue norvasc  8-Obesity -Body mass index is 47.1 kg/m. -low calorie diet and weight loss encourage  9-OHS/OSA -patient with positive ABG demonstrating hypercapnia -also with body habitus, increase daily sleepiness and snoring, suggesting OSA -will need sleep study/split nigh as after discharge and set up arrangements for BIPAP QHS  Code Status: Full Family Communication: no family at bedside  Disposition Plan: to be determine. Patient asking to be  discharge and threatening to leave AMA. Stat Psych consult requested. Patient with active schizophrenic features and unsure of own safety at this moment. Also not clear social situation as an outpatient. Continue supportive care, QHS BIPAP and follow rec's from psychiatry. (Child psychotherapistocial worker also consulted to assist with social needs and presumed homeless situation)   Consultants:  Psychiatry   Procedures:  See below for x-ray reports   Antibiotics:  None   HPI/Subjective: Afebrile, in no resp distress; denying CP and asking to be discharge. Patient with intermittent episodes of confusion appreciated by staff and pulling IV/telemetry leads. Also found talking with people who are not present in the room.  Objective: Vitals:   01/10/17 0338 01/10/17 0729  BP: 119/84 120/74  Pulse: 65 79  Resp:  18  Temp: 98.1 F (36.7 C) 98.6 F (37 C)    Intake/Output Summary (Last 24 hours) at 01/10/17 0956 Last data filed at 01/10/17 [id number]  Gross per 24 hour  Intake           736.67 ml  Output             2300 ml  Net         -1563.33 ml   Filed Weights   01/09/17 1730  Weight: (!) 140.5 kg (309 lb 11.9 oz)    Exam:   General: obese, in no respiratory distress AA male. Who is also afebrile, denying CP and asking to be discharge. Patient intermittently falling sleep and snoring during interview. He is overall more alert on exam and participated with PT. Patient with some schizophrenic features (talking to people not present in the room and coursing to them).  Cardiovascular: S1 and s2, no rubs, no gallops, no JVD, no murmurs  Respiratory: good air movement, no wheezing, no crackles   Abdomen: obese, soft, NT, ND, positive BS  Musculoskeletal: trace to 1 plus  edema bilaterally, no cyanosis or clubbing   Data Reviewed: Basic Metabolic Panel:  Recent Labs Lab 01/09/17 0636 01/10/17 0222  NA 137 139  K 3.8 3.9  CL 106 105  CO2 23 28  GLUCOSE 94 87  BUN 14 11  CREATININE 0.88  0.75  CALCIUM 8.9 8.2*   Liver Function Tests:  Recent Labs Lab 01/09/17 0636  AST 24  ALT 16*  ALKPHOS 65  BILITOT 0.5  PROT 6.7  ALBUMIN 3.6    Recent Labs Lab 01/09/17 0736  AMMONIA 38*   CBC:  Recent Labs Lab 01/09/17 0636  WBC 10.8*  HGB 13.5  HCT 43.4  MCV 86.5  PLT 153   CBG:  Recent Labs Lab 01/09/17 0621  GLUCAP 89    Recent Results (from the past 240 hour(s))  MRSA PCR Screening     Status: None   Collection Time: 01/09/17  5:58 PM  Result Value Ref Range Status   MRSA by PCR NEGATIVE NEGATIVE Final    Comment:        The GeneXpert MRSA Assay (FDA approved for NASAL specimens only), is one component of a comprehensive MRSA colonization surveillance program. It is not intended to diagnose MRSA infection nor to guide or monitor treatment for MRSA infections.      Studies: Ct Head Wo Contrast  Result Date: 01/09/2017 CLINICAL DATA:  46 year old male with slurred speech. Initial encounter. EXAM: CT HEAD WITHOUT CONTRAST TECHNIQUE: Contiguous axial images were obtained from the base of the skull through the vertex without intravenous contrast. COMPARISON:  05/25/2013 CT.  11/17/2012 MR. FINDINGS: Brain: No intracranial hemorrhage or CT evidence of large acute infarct. No intracranial mass lesion noted on this unenhanced exam. Vascular: No hyperdense vessel. Skull: Negative. Sinuses/Orbits: No acute orbital abnormality. Opacification left frontal sinus, left ethmoid sinus mid to anterior air cells and left maxillary sinus. Opacification left ostiomeatal complex. Other: Negative. IMPRESSION: No intracranial hemorrhage or CT evidence of large acute infarct. Opacification left frontal sinus, left ethmoid sinus mid to anterior air cells and left maxillary sinus. Opacification left ostiomeatal complex. Electronically Signed   By: Lacy Duverney M.D.   On: 01/09/2017 08:58   Dg Chest Port 1 View  Result Date: 01/09/2017 CLINICAL DATA:  Hypoxia EXAM:  PORTABLE CHEST 1 VIEW COMPARISON:  12/21/2016 FINDINGS: Low volume chest with artifact from EKG leads. There is no edema, consolidation, effusion, or pneumothorax. Normal heart size and mediastinal contours when allowing for mild rightward rotation. Widening of the left acromioclavicular joint appears chronic based on prior. No acute osseous finding. IMPRESSION: Negative portable chest. Electronically Signed   By: Marnee Spring M.D.   On: 01/09/2017 13:55    Scheduled Meds: . amLODipine  10 mg Oral Daily  . carbamazepine  200 mg Oral BID  . chlorhexidine  15 mL Mouth Rinse BID  . enoxaparin (LOVENOX) injection  40 mg Subcutaneous Q24H  . mouth rinse  15 mL Mouth Rinse q12n4p  . pantoprazole  20 mg Oral Daily  . QUEtiapine  50 mg Oral BID  . sodium chloride flush  3 mL Intravenous Q12H  . sodium chloride flush  3 mL Intravenous Q12H   Continuous Infusions: . sodium chloride      Principal Problem:   Acute respiratory failure with hypercapnia (HCC) Active Problems:   Encephalopathy acute   Paranoid schizophrenia (HCC)   Somnolence    Time spent: time 35 minutes    Vassie Loll  [facility] Pager [phone]. If 7PM-7AM,  please contact night-coverage at [email or web address], password [facility] 01/10/2017, 9:56 AM  LOS: 0 days

## 2017-01-10 NOTE — ED Provider Notes (Signed)
[facility]-EMERGENCY DEPT Provider Note   CSN: [id number] Arrival date & time: 01/09/17  0601     History   Chief Complaint Chief Complaint  Patient presents with  . Altered Mental Status    HPI [name] [name] is a 46 y.o. male.  HPI Patient presents to the emergency department with altered mental status.  The patient was brought in by EMS after someone found him sleeping at a gas station.  Patient is unable to give any history.  Does have a extensive psych history.  He will open his eyes and shake his head and follow commands, but otherwise he will not give me any history Past Medical History:  Diagnosis Date  . Bipolar depression (HCC)   . Hypertension   . Pneumonia    had aspiration pneumonia 5/14-on vent  . Schizophrenia [facility])     Patient Active Problem List   Diagnosis Date Noted  . Somnolence 01/09/2017  . Paranoid schizophrenia (HCC) 04/20/2013  . Acute respiratory failure with hypercapnia (HCC) 11/17/2012  . Encephalopathy acute 11/17/2012    Past Surgical History:  Procedure Laterality Date  . arm surgery     hurt his arm 25 hr ago  . OPEN REDUCTION INTERNAL FIXATION (ORIF) PROXIMAL PHALANX Right 04/02/2013   Procedure: OPEN REDUCTION INTERNAL FIXATION (ORIF) RIGHT SMALL FINGER;  Surgeon: Tami Ribas, MD;  Location: [facility];  Service: Orthopedics;  Laterality: Right;       Home Medications    Prior to Admission medications   Medication Sig Start Date End Date Taking? Authorizing Provider  albuterol (PROVENTIL HFA;VENTOLIN HFA) 108 (90 Base) MCG/ACT inhaler Inhale 1-2 puffs into the lungs every 6 (six) hours as needed for wheezing or shortness of breath. 12/22/16   Arby Barrette, MD  amLODipine (NORVASC) 10 MG tablet Take 1 tablet (10 mg total) by mouth daily. 12/22/16   Arby Barrette, MD  carbamazepine (TEGRETOL) 200 MG tablet Take 1 tablet (200 mg total) by mouth 2 (two) times daily. Patient not taking: Reported on 11/25/2016 09/02/16   Charlynne Pander, MD  furosemide (LASIX) 20 MG tablet Take 1 tablet (20 mg total) by mouth daily. 12/22/16   Arby Barrette, MD  pantoprazole (PROTONIX) 20 MG tablet Take 1 tablet (20 mg total) by mouth daily. 12/22/16   Arby Barrette, MD  QUEtiapine (SEROQUEL) 50 MG tablet Take 1 tablet (50 mg total) by mouth 3 (three) times daily. 12/22/16   Arby Barrette, MD    Family History No family history on file.  Social History Social History  Substance Use Topics  . Smoking status: Current Every Day Smoker    Types: Cigarettes  . Smokeless tobacco: Never Used  . Alcohol use Yes     Allergies   Tomato   Review of Systems Review of Systems Level V caveat applies due to altered mental status  Physical Exam Updated Vital Signs BP 120/74 (BP Location: Right Arm)   Pulse 79   Temp 98.6 F (37 C) (Oral)   Resp 18   Ht 5\' 8"  (1.727 m)   Wt (!) 140.5 kg (309 lb 11.9 oz)   SpO2 95%   BMI 47.10 kg/m   Physical Exam  Constitutional: He appears well-developed and well-nourished. No distress.  HENT:  Head: Normocephalic and atraumatic.  Mouth/Throat: Oropharynx is clear and moist.  Eyes: Pupils are equal, round, and reactive to light.  Neck: Normal range of motion. Neck supple.  Cardiovascular: Normal rate, regular rhythm and normal heart sounds.  Exam reveals no gallop and no friction rub.   No murmur heard. Pulmonary/Chest: Effort normal and breath sounds normal. No respiratory distress. He has no wheezes.  Abdominal: Soft. Bowel sounds are normal. He exhibits no distension. There is no tenderness.  Neurological: GCS eye subscore is 3. GCS verbal subscore is 4. GCS motor subscore is 4.  Skin: Skin is warm and dry. Capillary refill takes less than 2 seconds. No rash noted. No erythema.  Psychiatric: He has a normal mood and affect. His behavior is normal.  Nursing note and vitals reviewed.    ED Treatments / Results  Labs (all labs ordered are listed, but only abnormal results  are displayed) Labs Reviewed  COMPREHENSIVE METABOLIC PANEL - Abnormal; Notable for the following:       Result Value   ALT 16 (*)    All other components within normal limits  CBC - Abnormal; Notable for the following:    WBC 10.8 (*)    RDW 16.4 (*)    All other components within normal limits  RAPID URINE DRUG SCREEN, HOSP PERFORMED - Abnormal; Notable for the following:    Cocaine POSITIVE (*)    Tetrahydrocannabinol POSITIVE (*)    All other components within normal limits  ACETAMINOPHEN LEVEL - Abnormal; Notable for the following:    Acetaminophen (Tylenol), Serum <10 (*)    All other components within normal limits  AMMONIA - Abnormal; Notable for the following:    Ammonia 38 (*)    All other components within normal limits  BASIC METABOLIC PANEL - Abnormal; Notable for the following:    Calcium 8.2 (*)    All other components within normal limits  I-STAT ARTERIAL BLOOD GAS, ED - Abnormal; Notable for the following:    pH, Arterial 7.315 (*)    pCO2 arterial 54.8 (*)    All other components within normal limits  MRSA PCR SCREENING  ETHANOL  HIV ANTIBODY (ROUTINE TESTING)  CBG MONITORING, ED    EKG  EKG Interpretation  Date/Time:  Monday January 09 2017 06:37:26 EDT Ventricular Rate:  86 PR Interval:    QRS Duration: 91 QT Interval:  343 QTC Calculation: 411 R Axis:   59 Text Interpretation:  Sinus rhythm Anteroseptal infarct, old When compared with ECG of 2/206/2015, No significant change was found Confirmed by Dione Booze ([id number]) on 01/09/2017 6:47:44 AM       Radiology Ct Head Wo Contrast  Result Date: 01/09/2017 CLINICAL DATA:  46 year old male with slurred speech. Initial encounter. EXAM: CT HEAD WITHOUT CONTRAST TECHNIQUE: Contiguous axial images were obtained from the base of the skull through the vertex without intravenous contrast. COMPARISON:  05/25/2013 CT.  11/17/2012 MR. FINDINGS: Brain: No intracranial hemorrhage or CT evidence of large acute infarct.  No intracranial mass lesion noted on this unenhanced exam. Vascular: No hyperdense vessel. Skull: Negative. Sinuses/Orbits: No acute orbital abnormality. Opacification left frontal sinus, left ethmoid sinus mid to anterior air cells and left maxillary sinus. Opacification left ostiomeatal complex. Other: Negative. IMPRESSION: No intracranial hemorrhage or CT evidence of large acute infarct. Opacification left frontal sinus, left ethmoid sinus mid to anterior air cells and left maxillary sinus. Opacification left ostiomeatal complex. Electronically Signed   By: Lacy Duverney M.D.   On: 01/09/2017 08:58   Dg Chest Port 1 View  Result Date: 01/09/2017 CLINICAL DATA:  Hypoxia EXAM: PORTABLE CHEST 1 VIEW COMPARISON:  12/21/2016 FINDINGS: Low volume chest with artifact from EKG leads. There is no edema, consolidation, effusion,  or pneumothorax. Normal heart size and mediastinal contours when allowing for mild rightward rotation. Widening of the left acromioclavicular joint appears chronic based on prior. No acute osseous finding. IMPRESSION: Negative portable chest. Electronically Signed   By: Marnee SpringJonathon  Watts M.D.   On: 01/09/2017 13:55    Procedures Procedures (including critical care time)  Medications Ordered in ED Medications  ammonia inhalant 0.3 mL (0.3 mLs Inhalation Given by Other 01/09/17 0723)  naloxone (NARCAN) 0.4 MG/ML injection (0.4 mg  Given 01/09/17 0630)  naloxone (NARCAN) injection 2 mg (2 mg Intravenous Given 01/09/17 0645)  sodium chloride 0.9 % 1,000 mL with thiamine 100 mg, folic acid 1 mg, multivitamins adult 10 mL infusion ( Intravenous New Bag/Given 01/09/17 1850)     Initial Impression / Assessment and Plan / ED Course  I have reviewed the triage vital signs and the nursing notes.  Pertinent labs & imaging results that were available during my care of the patient were reviewed by me and considered in my medical decision making (see chart for details).     Patient is placed on  BiPAP.  He will be admitted to the hospital for hypoxia.  He was taken off the BiPAP.  His pulse ox dropped into the upper 60s.  Patient is unclear as to why is hypoxic and why he is so somnolent.  Patient will follow commands throughout his entire visit here in the emergency department.  I spoke with the [facility] hospitalist, who will admit the patient  Final Clinical Impressions(s) / ED Diagnoses   Final diagnoses:  Disorientation  Hypoxia    New Prescriptions Discharge Medication List as of 01/10/2017 10:48 AM       Charlestine NightLawyer, Marios Gaiser, PA-C 01/10/17 1639    Dione BoozeGlick, David, MD 01/16/17 1041

## 2017-01-10 NOTE — Clinical Social Work Note (Signed)
CSW met with patient. He is leaving AMA. Patient reports that he is going to the [employer] on [address]. He needs a bus pass. Discussed with Surveyor, quantity of social work who stated that under most circumstances we do not provide bus passes if a patient is leaving AMA. Patient notified and subsequently walked off the unit.  CSW signing off.  Dayton Scrape, [place]

## 2017-01-12 NOTE — Congregational Nurse Program (Signed)
Congregational Nurse Program Note  Date of Encounter: 12/13/2016  Past Medical History: Past Medical History:  Diagnosis Date  . Bipolar depression (HCC)   . Hypertension   . Pneumonia    had aspiration pneumonia 5/14-on vent  . Schizophrenia (HCC)    . Encounter Details: Continues large pitting edema both lower extremities. CN [facility]. CN reviewed taking lasix prescription with client. Also discussed plan for follow up with Kara MeadM Placey NP at [facility]. Client states [facility] this [facility]. States he does not need bus passes.

## 2017-01-21 ENCOUNTER — Encounter (HOSPITAL_COMMUNITY): Payer: Self-pay | Admitting: Emergency Medicine

## 2017-01-21 ENCOUNTER — Emergency Department (HOSPITAL_COMMUNITY)
Admission: EM | Admit: 2017-01-21 | Discharge: 2017-01-21 | Disposition: A | Discharge status: Home / Self Care | Payer: Medicare Other | Attending: Emergency Medicine | Admitting: Emergency Medicine

## 2017-01-21 DIAGNOSIS — M79605 Pain in left leg: Secondary | ICD-10-CM | POA: Diagnosis present

## 2017-01-21 DIAGNOSIS — F17210 Nicotine dependence, cigarettes, uncomplicated: Secondary | ICD-10-CM | POA: Insufficient documentation

## 2017-01-21 DIAGNOSIS — Z79899 Other long term (current) drug therapy: Secondary | ICD-10-CM | POA: Insufficient documentation

## 2017-01-21 DIAGNOSIS — M79604 Pain in right leg: Secondary | ICD-10-CM | POA: Diagnosis not present

## 2017-01-21 DIAGNOSIS — I10 Essential (primary) hypertension: Secondary | ICD-10-CM | POA: Diagnosis not present

## 2017-01-21 DIAGNOSIS — R600 Localized edema: Secondary | ICD-10-CM

## 2017-01-21 LAB — I-STAT CHEM 8, ED
BUN: 19 mg/dL (ref 6–20)
CREATININE: 0.8 mg/dL (ref 0.61–1.24)
Calcium, Ion: 1.15 mmol/L (ref 1.15–1.40)
Chloride: 101 mmol/L (ref 101–111)
GLUCOSE: 83 mg/dL (ref 65–99)
HCT: 46 % (ref 39.0–52.0)
HEMOGLOBIN: 15.6 g/dL (ref 13.0–17.0)
POTASSIUM: 3.8 mmol/L (ref 3.5–5.1)
Sodium: 139 mmol/L (ref 135–145)
TCO2: 26 mmol/L (ref 0–100)

## 2017-01-21 MED ORDER — AMLODIPINE BESYLATE 10 MG PO TABS: 10.0000 mg | ORAL_TABLET | Freq: Every day | ORAL | 1 refills | Status: DC

## 2017-01-21 MED ORDER — POTASSIUM CHLORIDE ER 10 MEQ PO TBCR: 10.0000 meq | EXTENDED_RELEASE_TABLET | Freq: Every day | ORAL | 0 refills | Status: DC

## 2017-01-21 MED ORDER — FUROSEMIDE 20 MG PO TABS: 20.0000 mg | ORAL_TABLET | Freq: Every day | ORAL | 0 refills | Status: DC

## 2017-01-21 NOTE — ED Provider Notes (Signed)
[facility]-EMERGENCY DEPT Provider Note   CSN: [id number] Arrival date & time: 01/21/17  1403     History   Chief Complaint Chief Complaint  Patient presents with  . Leg Pain    HPI [name] [name] is a 46 y.o. male.  46 year old male presents with worsening chronic lower extremity edema. Patient had been on Lasix but states that he is out of this at this time. Denies any shortness of breath or chest discomfort. Is able to urinate without any trouble. Denies any upper extremity edema. Symptoms cause dull achiness when he walks. No CHF symptoms. Nothing makes his symptoms better      Past Medical History:  Diagnosis Date  . Bipolar depression (HCC)   . Hypertension   . Pneumonia    had aspiration pneumonia 5/14-on vent  . Schizophrenia [facility](HCC)     Patient Active Problem List   Diagnosis Date Noted  . Somnolence 01/09/2017  . Paranoid schizophrenia (HCC) 04/20/2013  . Acute respiratory failure with hypercapnia (HCC) 11/17/2012  . Encephalopathy acute 11/17/2012    Past Surgical History:  Procedure Laterality Date  . arm surgery     hurt his arm 25 hr ago  . OPEN REDUCTION INTERNAL FIXATION (ORIF) PROXIMAL PHALANX Right 04/02/2013   Procedure: OPEN REDUCTION INTERNAL FIXATION (ORIF) RIGHT SMALL FINGER;  Surgeon: Tami RibasKevin R Kuzma, MD;  Location: [facility];  Service: Orthopedics;  Laterality: Right;       Home Medications    Prior to Admission medications   Medication Sig Start Date End Date Taking? Authorizing Provider  albuterol (PROVENTIL HFA;VENTOLIN HFA) 108 (90 Base) MCG/ACT inhaler Inhale 1-2 puffs into the lungs every 6 (six) hours as needed for wheezing or shortness of breath. 12/22/16   Arby BarrettePfeiffer, Marcy, MD  amLODipine (NORVASC) 10 MG tablet Take 1 tablet (10 mg total) by mouth daily. 12/22/16   Arby BarrettePfeiffer, Marcy, MD  carbamazepine (TEGRETOL) 200 MG tablet Take 1 tablet (200 mg total) by mouth 2 (two) times daily. Patient not taking: Reported on 11/25/2016  09/02/16   Charlynne PanderYao, David Hsienta, MD  furosemide (LASIX) 20 MG tablet Take 1 tablet (20 mg total) by mouth daily. 12/22/16   Arby BarrettePfeiffer, Marcy, MD  pantoprazole (PROTONIX) 20 MG tablet Take 1 tablet (20 mg total) by mouth daily. 12/22/16   Arby BarrettePfeiffer, Marcy, MD  QUEtiapine (SEROQUEL) 50 MG tablet Take 1 tablet (50 mg total) by mouth 3 (three) times daily. 12/22/16   Arby BarrettePfeiffer, Marcy, MD    Family History History reviewed. No pertinent family history.  Social History Social History  Substance Use Topics  . Smoking status: Current Every Day Smoker    Types: Cigarettes  . Smokeless tobacco: Never Used  . Alcohol use Yes     Allergies   Tomato   Review of Systems Review of Systems  All other systems reviewed and are negative.    Physical Exam Updated Vital Signs BP (!) 158/119 (BP Location: Left Arm)   Pulse 88   Temp 98.8 F (37.1 C) (Oral)   Resp 18   SpO2 99%   Physical Exam  Constitutional: He is oriented to person, place, and time. He appears well-developed and well-nourished.  Non-toxic appearance. No distress.  HENT:  Head: Normocephalic and atraumatic.  Eyes: Pupils are equal, round, and reactive to light. Conjunctivae, EOM and lids are normal.  Neck: Normal range of motion. Neck supple. No tracheal deviation present. No thyroid mass present.  Cardiovascular: Normal rate, regular rhythm and normal heart sounds.  Exam  reveals no gallop.   No murmur heard. Pulmonary/Chest: Effort normal and breath sounds normal. No stridor. No respiratory distress. He has no decreased breath sounds. He has no wheezes. He has no rhonchi. He has no rales.  Abdominal: Soft. Normal appearance and bowel sounds are normal. He exhibits no distension. There is no tenderness. There is no rebound and no CVA tenderness.  Musculoskeletal: Normal range of motion. He exhibits no edema or tenderness.  Lymphadenopathy:  3+ bilateral lower extremity pitting edema. Neurovascular status intact at both feet    Neurological: He is alert and oriented to person, place, and time. He has normal strength. No cranial nerve deficit or sensory deficit. GCS eye subscore is 4. GCS verbal subscore is 5. GCS motor subscore is 6.  Skin: Skin is warm and dry. No abrasion and no rash noted.  Psychiatric: His speech is normal. His affect is blunt. He is withdrawn.  Nursing note and vitals reviewed.    ED Treatments / Results  Labs (all labs ordered are listed, but only abnormal results are displayed) Labs Reviewed  I-STAT CHEM 8, ED    EKG  EKG Interpretation None       Radiology No results found.  Procedures Procedures (including critical care time)  Medications Ordered in ED Medications - No data to display   Initial Impression / Assessment and Plan / ED Course  I have reviewed the triage vital signs and the nursing notes.  Pertinent labs & imaging results that were available during my care of the patient were reviewed by me and considered in my medical decision making (see chart for details).     Patient be placed back on his Lasix for his chronic leg pain and will follow-up with his doctor  Final Clinical Impressions(s) / ED Diagnoses   Final diagnoses:  None    New Prescriptions New Prescriptions   No medications on file     Lorre Nick, MD 01/21/17 2029

## 2017-01-21 NOTE — ED Notes (Signed)
Bed: [facility] Expected date:  Expected time:  Means of arrival:  Comments: 

## 2017-01-21 NOTE — ED Triage Notes (Addendum)
Per EMS. Pt reports ongoing bilateral leg pain and swelling. Denies any injury. Was seen at [facility] for same recently. Pt ambulatory to triage. Also reports he is out of his home medications

## 2017-01-21 NOTE — ED Notes (Signed)
Pt called for v/s recheck, pt is soundly sleeping in lobby with snoring and unable to arouse for v/s.

## 2017-01-26 MED FILL — AMLODIPINE BESYLATE 10 MG T: 10 | 30 days supply | Qty: 30 | Fill #1

## 2017-01-26 MED FILL — PANTOPRAZOLE SOD DR 20 MG T: 20 | 30 days supply | Qty: 30 | Fill #1

## 2017-01-30 ENCOUNTER — Encounter: Payer: Self-pay | Admitting: Pediatric Intensive Care

## 2017-02-07 ENCOUNTER — Emergency Department (HOSPITAL_COMMUNITY)
Admission: EM | Admit: 2017-02-07 | Discharge: 2017-02-07 | Disposition: A | Discharge status: Home / Self Care | Payer: Medicare Other | Attending: Emergency Medicine | Admitting: Emergency Medicine

## 2017-02-07 ENCOUNTER — Encounter (HOSPITAL_COMMUNITY): Payer: Self-pay | Admitting: Obstetrics and Gynecology

## 2017-02-07 DIAGNOSIS — I10 Essential (primary) hypertension: Secondary | ICD-10-CM | POA: Diagnosis not present

## 2017-02-07 DIAGNOSIS — M79671 Pain in right foot: Secondary | ICD-10-CM | POA: Diagnosis not present

## 2017-02-07 DIAGNOSIS — F17210 Nicotine dependence, cigarettes, uncomplicated: Secondary | ICD-10-CM | POA: Diagnosis not present

## 2017-02-07 DIAGNOSIS — Z79899 Other long term (current) drug therapy: Secondary | ICD-10-CM | POA: Insufficient documentation

## 2017-02-07 DIAGNOSIS — M79672 Pain in left foot: Secondary | ICD-10-CM | POA: Insufficient documentation

## 2017-02-07 NOTE — ED Provider Notes (Signed)
[facility]-EMERGENCY DEPT Provider Note   CSN: [id number] Arrival date & time: 02/07/17  1604     History   Chief Complaint Chief Complaint  Patient presents with  . Foot Pain    HPI [name] [name] is a 46 y.o. male.  The history is provided by the patient and medical records.  Foot Pain     46 y.o. M with hx of bipolar disorder, HTN, schizophrenia, hx of alcoholism and substance abuse, Presenting to the ED with complaints of bilateral foot pain.  EMS reports they were initially called out for a fall, however there was no fall. Patient was sitting behind [employer] eating cookies when they arrived. He complains of bilateral foot pain. Patient does fall asleep frequently during exam, states he has not slept well in about 4 days.  Patient apparently has guardian who has been notified, however no family members at bedside.  Patient denies drug or alcohol use today.  Past Medical History:  Diagnosis Date  . Bipolar depression (HCC)   . Hypertension   . Pneumonia    had aspiration pneumonia 5/14-on vent  . Schizophrenia [facility](HCC)     Patient Active Problem List   Diagnosis Date Noted  . Somnolence 01/09/2017  . Paranoid schizophrenia (HCC) 04/20/2013  . Acute respiratory failure with hypercapnia (HCC) 11/17/2012  . Encephalopathy acute 11/17/2012    Past Surgical History:  Procedure Laterality Date  . arm surgery     hurt his arm 25 hr ago  . OPEN REDUCTION INTERNAL FIXATION (ORIF) PROXIMAL PHALANX Right 04/02/2013   Procedure: OPEN REDUCTION INTERNAL FIXATION (ORIF) RIGHT SMALL FINGER;  Surgeon: Tami RibasKevin R Kuzma, MD;  Location: [facility];  Service: Orthopedics;  Laterality: Right;       Home Medications    Prior to Admission medications   Medication Sig Start Date End Date Taking? Authorizing Provider  albuterol (PROVENTIL HFA;VENTOLIN HFA) 108 (90 Base) MCG/ACT inhaler Inhale 1-2 puffs into the lungs every 6 (six) hours as needed for wheezing or shortness of  breath. 12/22/16   Arby BarrettePfeiffer, Marcy, MD  amLODipine (NORVASC) 10 MG tablet Take 1 tablet (10 mg total) by mouth daily. 01/21/17   Lorre NickAllen, Anthony, MD  carbamazepine (TEGRETOL) 200 MG tablet Take 1 tablet (200 mg total) by mouth 2 (two) times daily. Patient not taking: Reported on 11/25/2016 09/02/16   Charlynne PanderYao, David Hsienta, MD  furosemide (LASIX) 20 MG tablet Take 1 tablet (20 mg total) by mouth daily. 01/21/17   Lorre NickAllen, Anthony, MD  pantoprazole (PROTONIX) 20 MG tablet Take 1 tablet (20 mg total) by mouth daily. 12/22/16   Arby BarrettePfeiffer, Marcy, MD  potassium chloride (K-DUR) 10 MEQ tablet Take 1 tablet (10 mEq total) by mouth daily. 01/21/17   Lorre NickAllen, Anthony, MD  QUEtiapine (SEROQUEL) 50 MG tablet Take 1 tablet (50 mg total) by mouth 3 (three) times daily. 12/22/16   Arby BarrettePfeiffer, Marcy, MD    Family History History reviewed. No pertinent family history.  Social History Social History  Substance Use Topics  . Smoking status: Current Every Day Smoker    Types: Cigarettes  . Smokeless tobacco: Never Used  . Alcohol use Yes     Allergies   Tomato   Review of Systems Review of Systems  Musculoskeletal: Positive for arthralgias.  All other systems reviewed and are negative.    Physical Exam Updated Vital Signs BP (!) 162/86   Pulse 88   Temp 98.9 F (37.2 C)   Resp (!) 22   SpO2 96%  Physical Exam  Constitutional: He is oriented to person, place, and time. He appears well-developed and well-nourished.  Disheveled appearing, intermittently sleeping during exam  HENT:  Head: Normocephalic and atraumatic.  Mouth/Throat: Oropharynx is clear and moist.  Eyes: Pupils are equal, round, and reactive to light. Conjunctivae and EOM are normal.  Eyes are bloodshot, Pupils are pinpoint but reactive  Neck: Normal range of motion.  Cardiovascular: Normal rate, regular rhythm and normal heart sounds.   Pulmonary/Chest: Effort normal and breath sounds normal. No respiratory distress. He has no wheezes.    Abdominal: Soft. Bowel sounds are normal. There is no tenderness. There is no rebound.  Musculoskeletal: Normal range of motion.  Feet are dirty but no open wounds or sores; no bony deformities; some pedal edema noted at the ankles  Neurological: He is alert and oriented to person, place, and time.  When patient is awake and he is alert and oriented 3, he is moving his arms and legs normally  Skin: Skin is warm and dry.  Psychiatric: He has a normal mood and affect.  Nursing note and vitals reviewed.    ED Treatments / Results  Labs (all labs ordered are listed, but only abnormal results are displayed) Labs Reviewed - No data to display  EKG  EKG Interpretation None       Radiology No results found.  Procedures Procedures (including critical care time)  Medications Ordered in ED Medications - No data to display   Initial Impression / Assessment and Plan / ED Course  I have reviewed the triage vital signs and the nursing notes.  Pertinent labs & imaging results that were available during my care of the patient were reviewed by me and considered in my medical decision making (see chart for details).  46 year old male here with complaint of foot pain. He was picked up by EMS from [facility] [employer]. It was reported that he had a fall, however there was no fall. Patient complains of foot pain. He has history of gout. Patient is currently homeless, has had several visits recently for various complaints. Patient is somewhat drowsy on arrival, snoring loudly. When awakened, he is alert and oriented 3. He is able to answer questions and follow commands appropriately, although he does fall asleep again rather quickly. States he has not slept in about 4 days now.  His only complaint is foot pain. She is disheveled in appearance but there are no open wounds or sores of the feet, no bony deformities, some mild pedal edema which based on chart review patient has had for several  months now.  His eyes are bloodshot with pinpoint pupils.  Will observe here for now.  8:30PM Re-checked patient.  Remains about same as time of arrival.  Will awake and answer questions but falls back asleep quickly.  He does not appear disoriented when he is awake.  His vitals remain stable.  On chart review, patient has had prior ED visits for similar.  At this time, do not feel there is any acute intervention we can offer him at this time.  He does have hx of polysubstance abuse as well as alcoholism. He denies use of these but I suspect otherwise.  Will allow to sleep until more awake.  Patient also with likely element of sleep apnea given the nature of his snoring and body habitus.  Discussed with Dr. Jeraldine LootsLockwood-- agrees with plan.  9:14 PM Patient now awake, urinating in urinal at bedside.  Requesting to leave.  Feel this is appropriate.  Recommend to follow-up with PCP.  Return precautions given for any new/acute changes.   Final Clinical Impressions(s) / ED Diagnoses   Final diagnoses:  Pain in both feet    New Prescriptions Discharge Medication List as of 02/07/2017  9:11 PM       Garlon Hatchet, PA-C 02/07/17 2131    Gerhard Munch, MD 02/08/17 [phone]

## 2017-02-07 NOTE — ED Triage Notes (Signed)
Per EMS: Pt is coming from [employer] outside. Pt was found laying on his right side eating food. Pt c/o bilateral foot pain. Pt has a hx of gout, chf. htn and schizophrenia.  Pt is not able to keep eyes open and EMS reports pinpoint pupils. Pt states he has not slept in 4 days and that he "did not take anything." Pt is snoring as he is being transferred to room.  When pt can stay awake, he is AxO x4 Pt has a guardian who has been made aware.

## 2017-02-07 NOTE — Discharge Instructions (Signed)
Follow-up with your primary care doctor. °Return here for new concerns. °

## 2017-02-07 NOTE — ED Notes (Signed)
Bed: WA05 Expected date:  Expected time:  Means of arrival:  Comments: EMS 

## 2017-02-10 ENCOUNTER — Encounter: Payer: Self-pay | Admitting: Pediatric Intensive Care

## 2017-02-12 ENCOUNTER — Encounter (HOSPITAL_COMMUNITY): Payer: Self-pay | Admitting: Emergency Medicine

## 2017-02-12 ENCOUNTER — Emergency Department (HOSPITAL_COMMUNITY)
Admission: EM | Admit: 2017-02-12 | Discharge: 2017-02-12 | Disposition: A | Discharge status: Home / Self Care | Payer: Medicare Other | Attending: Emergency Medicine | Admitting: Emergency Medicine

## 2017-02-12 DIAGNOSIS — Z79899 Other long term (current) drug therapy: Secondary | ICD-10-CM | POA: Diagnosis not present

## 2017-02-12 DIAGNOSIS — R609 Edema, unspecified: Secondary | ICD-10-CM

## 2017-02-12 DIAGNOSIS — R600 Localized edema: Secondary | ICD-10-CM | POA: Insufficient documentation

## 2017-02-12 DIAGNOSIS — F17210 Nicotine dependence, cigarettes, uncomplicated: Secondary | ICD-10-CM | POA: Insufficient documentation

## 2017-02-12 DIAGNOSIS — I10 Essential (primary) hypertension: Secondary | ICD-10-CM | POA: Diagnosis not present

## 2017-02-12 LAB — CBC WITH DIFFERENTIAL/PLATELET
BASOS ABS: 0.1 10*3/uL (ref 0.0–0.1)
BASOS PCT: 1 %
EOS PCT: 2 %
Eosinophils Absolute: 0.2 10*3/uL (ref 0.0–0.7)
HCT: 43 % (ref 39.0–52.0)
Hemoglobin: 14.1 g/dL (ref 13.0–17.0)
Lymphocytes Relative: 17 %
Lymphs Abs: 1.7 10*3/uL (ref 0.7–4.0)
MCH: 27.9 pg (ref 26.0–34.0)
MCHC: 32.8 g/dL (ref 30.0–36.0)
MCV: 85 fL (ref 78.0–100.0)
MONO ABS: 0.8 10*3/uL (ref 0.1–1.0)
MONOS PCT: 8 %
Neutro Abs: 7.7 10*3/uL (ref 1.7–7.7)
Neutrophils Relative %: 72 %
PLATELETS: 135 10*3/uL — AB (ref 150–400)
RBC: 5.06 MIL/uL (ref 4.22–5.81)
RDW: 15.5 % (ref 11.5–15.5)
WBC: 10.5 10*3/uL (ref 4.0–10.5)

## 2017-02-12 LAB — COMPREHENSIVE METABOLIC PANEL
ALBUMIN: 3.3 g/dL — AB (ref 3.5–5.0)
ALK PHOS: 59 U/L (ref 38–126)
ALT: 21 U/L (ref 17–63)
AST: 25 U/L (ref 15–41)
Anion gap: 9 (ref 5–15)
BILIRUBIN TOTAL: 0.2 mg/dL — AB (ref 0.3–1.2)
BUN: 12 mg/dL (ref 6–20)
CALCIUM: 8.9 mg/dL (ref 8.9–10.3)
CO2: 25 mmol/L (ref 22–32)
CREATININE: 0.77 mg/dL (ref 0.61–1.24)
Chloride: 101 mmol/L (ref 101–111)
GFR calc Af Amer: >60 mL/min (ref >60)
GFR calc non Af Amer: >60 mL/min (ref >60)
GLUCOSE: 100 mg/dL — AB (ref 65–99)
Potassium: 4 mmol/L (ref 3.5–5.1)
Sodium: 135 mmol/L (ref 135–145)
TOTAL PROTEIN: 6.8 g/dL (ref 6.5–8.1)

## 2017-02-12 LAB — URIC ACID: Uric Acid, Serum: 6.8 mg/dL (ref 4.4–7.6)

## 2017-02-12 LAB — ACETAMINOPHEN LEVEL

## 2017-02-12 LAB — ETHANOL

## 2017-02-12 LAB — SALICYLATE LEVEL: Salicylate Lvl: <7 mg/dL (ref 2.8–30.0)

## 2017-02-12 NOTE — ED Triage Notes (Signed)
Pt brought in by EMS with c/o bilateral ankle pain and swelling  Pt was seen here last week or so and was given a script for Lasix and never got it filled so he continues to have swelling   Pt also states he has been off his psych meds for "a while"  Pt is talking to himself and cursing in triage

## 2017-02-12 NOTE — ED Provider Notes (Signed)
[facility]-EMERGENCY DEPT Provider Note   CSN: [id number] Arrival date & time: 02/12/17  0142  Time seen 5:25 AM   History   Chief Complaint Chief Complaint  Patient presents with  . Ankle Pain  . Medical Clearance   Level V caveat due to patient sleeping  HPI [name] [name] is a 46 y.o. male.  HPI patient is snoring loudly when I enter the room. When I do tactile stimuli he only awakens very briefly and goes back to snoring. The nurse then used ammonia and he woke up briefly. Most of his speech is mumbling however he states he's having a problem with his medication, when asking what kind of problem he is having with his medication he states none. Patient presented via EMS for complaints of swelling of his legs. He was seen for the same on July 31 and got a prescription of Lasix which he never got filled.  PCP Fleet ContrasAvbuere, Edwin, MD   Past Medical History:  Diagnosis Date  . Bipolar depression (HCC)   . Hypertension   . Pneumonia    had aspiration pneumonia 5/14-on vent  . Schizophrenia [facility](HCC)     Patient Active Problem List   Diagnosis Date Noted  . Somnolence 01/09/2017  . Paranoid schizophrenia (HCC) 04/20/2013  . Acute respiratory failure with hypercapnia (HCC) 11/17/2012  . Encephalopathy acute 11/17/2012    Past Surgical History:  Procedure Laterality Date  . arm surgery     hurt his arm 25 hr ago  . OPEN REDUCTION INTERNAL FIXATION (ORIF) PROXIMAL PHALANX Right 04/02/2013   Procedure: OPEN REDUCTION INTERNAL FIXATION (ORIF) RIGHT SMALL FINGER;  Surgeon: Tami RibasKevin R Kuzma, MD;  Location: [facility];  Service: Orthopedics;  Laterality: Right;       Home Medications    Prior to Admission medications   Medication Sig Start Date End Date Taking? Authorizing Provider  albuterol (PROVENTIL HFA;VENTOLIN HFA) 108 (90 Base) MCG/ACT inhaler Inhale 1-2 puffs into the lungs every 6 (six) hours as needed for wheezing or shortness of breath. 12/22/16   Arby BarrettePfeiffer, Marcy, MD    amLODipine (NORVASC) 10 MG tablet Take 1 tablet (10 mg total) by mouth daily. 01/21/17   Lorre NickAllen, Anthony, MD  carbamazepine (TEGRETOL) 200 MG tablet Take 1 tablet (200 mg total) by mouth 2 (two) times daily. Patient not taking: Reported on 11/25/2016 09/02/16   Charlynne PanderYao, David Hsienta, MD  furosemide (LASIX) 20 MG tablet Take 1 tablet (20 mg total) by mouth daily. 01/21/17   Lorre NickAllen, Anthony, MD  pantoprazole (PROTONIX) 20 MG tablet Take 1 tablet (20 mg total) by mouth daily. 12/22/16   Arby BarrettePfeiffer, Marcy, MD  potassium chloride (K-DUR) 10 MEQ tablet Take 1 tablet (10 mEq total) by mouth daily. 01/21/17   Lorre NickAllen, Anthony, MD  QUEtiapine (SEROQUEL) 50 MG tablet Take 1 tablet (50 mg total) by mouth 3 (three) times daily. 12/22/16   Arby BarrettePfeiffer, Marcy, MD    Family History History reviewed. No pertinent family history.  Social History Social History  Substance Use Topics  . Smoking status: Current Every Day Smoker    Types: Cigarettes  . Smokeless tobacco: Never Used  . Alcohol use Yes     Allergies   Tomato   Review of Systems Review of Systems  Unable to perform ROS: Psychiatric disorder     Physical Exam Updated Vital Signs BP (!) 168/124 (BP Location: Left Arm)   Pulse 86   Temp 98.8 F (37.1 C) (Oral)   Resp 18   Wt [name] Kitchen(!)  150.4 kg (331 lb 8 oz)   SpO2 98%   BMI 50.40 kg/m   Vital signs normal except for hypertension   Physical Exam  Constitutional: He appears well-developed and well-nourished.  Non-toxic appearance. He does not appear ill. No distress.  Obese, snoring  HENT:  Head: Normocephalic and atraumatic.  Right Ear: External ear normal.  Left Ear: External ear normal.  Nose: Nose normal. No mucosal edema or rhinorrhea.  Mouth/Throat: Oropharynx is clear and moist and mucous membranes are normal. No dental abscesses or uvula swelling.  Eyes: Pupils are equal, round, and reactive to light. Conjunctivae and EOM are normal.  Neck: Normal range of motion and full passive range  of motion without pain. Neck supple.  Cardiovascular: Normal rate, regular rhythm and normal heart sounds.  Exam reveals no gallop and no friction rub.   No murmur heard. Pulmonary/Chest: Effort normal and breath sounds normal. No respiratory distress. He has no wheezes. He has no rhonchi. He has no rales. He exhibits no tenderness and no crepitus.  Abdominal: Soft. Normal appearance and bowel sounds are normal. He exhibits no distension. There is no tenderness. There is no rebound and no guarding.  Genitourinary:  Genitourinary Comments: Patient is incontinent of urine  Musculoskeletal: Normal range of motion. He exhibits no edema or tenderness.  Moves all extremities well.   Neurological: He has normal strength. No cranial nerve deficit.  Moves all extremities well  Skin: Skin is warm, dry and intact. No rash noted. No erythema. No pallor.  Psychiatric: His speech is normal.  Unable to assess  Nursing note and vitals reviewed.    ED Treatments / Results  Labs (all labs ordered are listed, but only abnormal results are displayed) Labs Reviewed  ACETAMINOPHEN LEVEL - Abnormal; Notable for the following:       Result Value   Acetaminophen (Tylenol), Serum <10 (*)    All other components within normal limits  COMPREHENSIVE METABOLIC PANEL - Abnormal; Notable for the following:    Glucose, Bld 100 (*)    Albumin 3.3 (*)    Total Bilirubin 0.2 (*)    All other components within normal limits  CBC WITH DIFFERENTIAL/PLATELET - Abnormal; Notable for the following:    Platelets 135 (*)    All other components within normal limits  ETHANOL  SALICYLATE LEVEL  URIC ACID  RAPID URINE DRUG SCREEN, HOSP PERFORMED    EKG  EKG Interpretation None       Radiology No results found.  Procedures Procedures (including critical care time)  Medications Ordered in ED Medications - No data to display   Initial Impression / Assessment and Plan / ED Course  I have reviewed the triage  vital signs and the nursing notes.  Pertinent labs & imaging results that were available during my care of the patient were reviewed by me and considered in my medical decision making (see chart for details).     It was not clear to me why patient is here, he will be observed until he wakes up and is able to hold conversation.  7:15 AM patient was seen walking down the hall out of his room. He states he is ready to go. He is walking without difficulty. He states he did not get the prescription he was given last time filled and he's going to go get it filled today. He states he does not want to stay any longer. Patient denies any suicidal or homicidal ideation.  Final Clinical Impressions(s) /  ED Diagnoses   Final diagnoses:  Peripheral edema    New Prescriptions Patient is going to fill the Lasix he was given his last ED visit  Plan discharge  Devoria AlbeIva Laurren Lepkowski, MD, Concha PyoFACEP    Jamil Armwood, MD 02/12/17 [phone]

## 2017-02-12 NOTE — Discharge Instructions (Signed)
Get the medication filled that you were given before. Recheck if that doesn't help.

## 2017-02-14 MED FILL — QUETIAPINE FUMARATE 50 MG T: 50 | 30 days supply | Qty: 90 | Fill #0

## 2017-02-14 MED FILL — VENTOLIN HFA 90 MCG INHALER: 108 (90 BAS | 25 days supply | Qty: 18 | Fill #0

## 2017-02-17 NOTE — Congregational Nurse Program (Signed)
Congregational Nurse Program Note  Date of Encounter: 01/30/2017  Past Medical History: Past Medical History:  Diagnosis Date  . Bipolar depression (HCC)   . Hypertension   . Pneumonia    had aspiration pneumonia 5/14-on vent  . Schizophrenia [facility](HCC)     Encounter Details:     CNP Questionnaire - 01/30/17 1100      Patient Demographics   Is this a new or existing patient? Existing   Patient is considered a/an Not Applicable   Race African-American/Black     Patient Assistance   Location of Patient Assistance [facility]   Patient's financial/insurance status Medicaid;Medicare;Low Income   Uninsured Patient ([employer], [employer]) No   Patient referred to apply for the following financial assistance Not Applicable   Food insecurities addressed Not Applicable   Transportation assistance No   Assistance securing medications No   Educational health offerings Not Applicable     Encounter Details   Primary purpose of visit Post ED/Hospitalization Visit   Was an Emergency Department visit averted? Not Applicable   Does patient have a medical provider? No   Patient referred to Establish PCP   Was a mental health screening completed? (GAINS tool) No   Does patient have dental issues? No   Does patient have vision issues? No   Does your patient have an abnormal blood pressure today? Yes   Since previous encounter, have you referred patient for abnormal blood pressure that resulted in a new diagnosis or medication change? No   Does your patient have an abnormal blood glucose today? No   Since previous encounter, have you referred patient for abnormal blood glucose that resulted in a new diagnosis or medication change? No   Was there a life-saving intervention made? No     BP check. Client states he is not taking medications. Cannot tell CN if he currently has medications as he is frequently falling asleep in chair. CN [facility].

## 2017-02-19 ENCOUNTER — Emergency Department (HOSPITAL_COMMUNITY): Payer: Medicare Other

## 2017-02-19 ENCOUNTER — Emergency Department (HOSPITAL_COMMUNITY)
Admission: EM | Admit: 2017-02-19 | Discharge: 2017-02-19 | Disposition: A | Discharge status: Home / Self Care | Payer: Medicare Other | Attending: Emergency Medicine | Admitting: Emergency Medicine

## 2017-02-19 ENCOUNTER — Encounter (HOSPITAL_COMMUNITY): Payer: Self-pay

## 2017-02-19 DIAGNOSIS — I10 Essential (primary) hypertension: Secondary | ICD-10-CM | POA: Diagnosis not present

## 2017-02-19 DIAGNOSIS — R600 Localized edema: Secondary | ICD-10-CM

## 2017-02-19 DIAGNOSIS — M79604 Pain in right leg: Secondary | ICD-10-CM | POA: Diagnosis not present

## 2017-02-19 DIAGNOSIS — R2243 Localized swelling, mass and lump, lower limb, bilateral: Secondary | ICD-10-CM | POA: Diagnosis not present

## 2017-02-19 DIAGNOSIS — F1010 Alcohol abuse, uncomplicated: Secondary | ICD-10-CM

## 2017-02-19 DIAGNOSIS — M79605 Pain in left leg: Secondary | ICD-10-CM | POA: Diagnosis not present

## 2017-02-19 DIAGNOSIS — Z79899 Other long term (current) drug therapy: Secondary | ICD-10-CM | POA: Diagnosis not present

## 2017-02-19 DIAGNOSIS — Z7901 Long term (current) use of anticoagulants: Secondary | ICD-10-CM | POA: Diagnosis not present

## 2017-02-19 DIAGNOSIS — F17210 Nicotine dependence, cigarettes, uncomplicated: Secondary | ICD-10-CM | POA: Diagnosis not present

## 2017-02-19 LAB — COMPREHENSIVE METABOLIC PANEL
ALK PHOS: 55 U/L (ref 38–126)
ALT: 23 U/L (ref 17–63)
ANION GAP: 10 (ref 5–15)
AST: 43 U/L — ABNORMAL HIGH (ref 15–41)
Albumin: 3.5 g/dL (ref 3.5–5.0)
BUN: 10 mg/dL (ref 6–20)
CALCIUM: 8.6 mg/dL — AB (ref 8.9–10.3)
CO2: 22 mmol/L (ref 22–32)
Chloride: 105 mmol/L (ref 101–111)
Creatinine, Ser: 0.68 mg/dL (ref 0.61–1.24)
GFR calc non Af Amer: >60 mL/min (ref >60)
GLUCOSE: 99 mg/dL (ref 65–99)
Potassium: 4.3 mmol/L (ref 3.5–5.1)
SODIUM: 137 mmol/L (ref 135–145)
TOTAL PROTEIN: 7.2 g/dL (ref 6.5–8.1)
Total Bilirubin: 0.5 mg/dL (ref 0.3–1.2)

## 2017-02-19 LAB — BLOOD GAS, VENOUS
ACID-BASE DEFICIT: 2.4 mmol/L — AB (ref 0.0–2.0)
BICARBONATE: 25 mmol/L (ref 20.0–28.0)
FIO2: 21
O2 Saturation: 78.6 %
PCO2 VEN: 54.1 mmHg (ref 44.0–60.0)
PH VEN: 7.288 (ref 7.250–7.430)
PO2 VEN: 49.6 mmHg — AB (ref 32.0–45.0)
Patient temperature: 98.6

## 2017-02-19 LAB — CBC WITH DIFFERENTIAL/PLATELET
BASOS PCT: 1 %
Basophils Absolute: 0.1 10*3/uL (ref 0.0–0.1)
EOS ABS: 0.2 10*3/uL (ref 0.0–0.7)
EOS PCT: 2 %
HCT: 43.8 % (ref 39.0–52.0)
HEMOGLOBIN: 14.4 g/dL (ref 13.0–17.0)
LYMPHS ABS: 1.7 10*3/uL (ref 0.7–4.0)
Lymphocytes Relative: 17 %
MCH: 27.9 pg (ref 26.0–34.0)
MCHC: 32.9 g/dL (ref 30.0–36.0)
MCV: 84.7 fL (ref 78.0–100.0)
MONO ABS: 0.9 10*3/uL (ref 0.1–1.0)
MONOS PCT: 9 %
Neutro Abs: 6.9 10*3/uL (ref 1.7–7.7)
Neutrophils Relative %: 71 %
PLATELETS: 176 10*3/uL (ref 150–400)
RBC: 5.17 MIL/uL (ref 4.22–5.81)
RDW: 15.4 % (ref 11.5–15.5)
WBC: 9.7 10*3/uL (ref 4.0–10.5)

## 2017-02-19 LAB — BRAIN NATRIURETIC PEPTIDE: B Natriuretic Peptide: 9.4 pg/mL (ref 0.0–100.0)

## 2017-02-19 MED ORDER — AMMONIA AROMATIC IN INHA
RESPIRATORY_TRACT | Status: AC
  Administered 2017-02-19: 09:00:00
  Filled 2017-02-19: qty 10

## 2017-02-19 NOTE — ED Notes (Signed)
Patient ambulated to restroom with steady gait with no assistance.

## 2017-02-19 NOTE — ED Provider Notes (Signed)
[facility]-EMERGENCY DEPT Provider Note   CSN: [id number] Arrival date & time: 02/19/17  4098     History   Chief Complaint Chief Complaint  Patient presents with  . Leg Pain    HPI [name] [name] is a 46 y.o. male.  Level V caveat sleeping. The patient checked in with a chief complaint of bilateral leg swelling and pain. Looking back at prior notes this is his sixth or seventh visit in the ED for this. I'm unable to wake up the patient to discuss this with him.He also told nursing staff that he had been drinking heavily last night.   The history is provided by the patient.  Leg Pain   This is a chronic problem. The current episode started more than 1 week ago. The problem occurs constantly. The problem has not changed since onset.   Past Medical History:  Diagnosis Date  . Bipolar depression (HCC)   . Hypertension   . Pneumonia    had aspiration pneumonia 5/14-on vent  . Schizophrenia [facility])     Patient Active Problem List   Diagnosis Date Noted  . Somnolence 01/09/2017  . Paranoid schizophrenia (HCC) 04/20/2013  . Acute respiratory failure with hypercapnia (HCC) 11/17/2012  . Encephalopathy acute 11/17/2012    Past Surgical History:  Procedure Laterality Date  . arm surgery     hurt his arm 25 hr ago  . OPEN REDUCTION INTERNAL FIXATION (ORIF) PROXIMAL PHALANX Right 04/02/2013   Procedure: OPEN REDUCTION INTERNAL FIXATION (ORIF) RIGHT SMALL FINGER;  Surgeon: Tami Ribas, MD;  Location: [facility];  Service: Orthopedics;  Laterality: Right;       Home Medications    Prior to Admission medications   Medication Sig Start Date End Date Taking? Authorizing Provider  albuterol (PROVENTIL HFA;VENTOLIN HFA) 108 (90 Base) MCG/ACT inhaler Inhale 1-2 puffs into the lungs every 6 (six) hours as needed for wheezing or shortness of breath. 12/22/16   Arby Barrette, MD  amLODipine (NORVASC) 10 MG tablet Take 1 tablet (10 mg total) by mouth daily. 01/21/17   Lorre Nick, MD  carbamazepine (TEGRETOL) 200 MG tablet Take 1 tablet (200 mg total) by mouth 2 (two) times daily. Patient not taking: Reported on 11/25/2016 09/02/16   Charlynne Pander, MD  furosemide (LASIX) 20 MG tablet Take 1 tablet (20 mg total) by mouth daily. 01/21/17   Lorre Nick, MD  pantoprazole (PROTONIX) 20 MG tablet Take 1 tablet (20 mg total) by mouth daily. 12/22/16   Arby Barrette, MD  potassium chloride (K-DUR) 10 MEQ tablet Take 1 tablet (10 mEq total) by mouth daily. 01/21/17   Lorre Nick, MD  QUEtiapine (SEROQUEL) 50 MG tablet Take 1 tablet (50 mg total) by mouth 3 (three) times daily. 12/22/16   Arby Barrette, MD    Family History History reviewed. No pertinent family history.  Social History Social History  Substance Use Topics  . Smoking status: Current Every Day Smoker    Types: Cigarettes  . Smokeless tobacco: Never Used  . Alcohol use Yes     Allergies   Tomato   Review of Systems Review of Systems  Unable to perform ROS: Mental status change  Constitutional: Negative for chills and fever.  HENT: Negative for congestion and facial swelling.   Eyes: Negative for discharge and visual disturbance.  Respiratory: Negative for shortness of breath.   Cardiovascular: Negative for chest pain and palpitations.  Gastrointestinal: Negative for abdominal pain, diarrhea and vomiting.  Musculoskeletal: Negative for  arthralgias and myalgias.  Skin: Negative for color change and rash.  Neurological: Negative for tremors, syncope and headaches.  Psychiatric/Behavioral: Negative for confusion and dysphoric mood.     Physical Exam Updated Vital Signs BP (!) 142/91 (BP Location: Left Arm)   Pulse 95   Temp (!) 97.4 F (36.3 C) (Axillary)   Resp 18   SpO2 96%   Physical Exam  Constitutional: He appears well-developed and well-nourished.  HENT:  Head: Normocephalic and atraumatic.  Eyes: Pupils are equal, round, and reactive to light. EOM are normal.    Neck: Normal range of motion. Neck supple. No JVD present.  Cardiovascular: Normal rate and regular rhythm.  Exam reveals no gallop and no friction rub.   No murmur heard. Pulmonary/Chest: No respiratory distress. He has no wheezes.  Abdominal: He exhibits no distension and no mass. There is no tenderness. There is no rebound and no guarding.  Musculoskeletal: Normal range of motion. He exhibits edema.  Skin: No rash noted. No pallor.  Psychiatric: He has a normal mood and affect. His behavior is normal.  Nursing note and vitals reviewed.    ED Treatments / Results  Labs (all labs ordered are listed, but only abnormal results are displayed) Labs Reviewed  COMPREHENSIVE METABOLIC PANEL - Abnormal; Notable for the following:       Result Value   Calcium 8.6 (*)    AST 43 (*)    All other components within normal limits  BLOOD GAS, VENOUS - Abnormal; Notable for the following:    pO2, Ven 49.6 (*)    Acid-base deficit 2.4 (*)    All other components within normal limits  CBC WITH DIFFERENTIAL/PLATELET  BRAIN NATRIURETIC PEPTIDE    EKG  EKG Interpretation None       Radiology Dg Chest Port 1 View  Result Date: 02/19/2017 CLINICAL DATA:  Bilateral leg pain for 1 week EXAM: PORTABLE CHEST 1 VIEW COMPARISON:  Two-view chest x-ray 01/09/2017 FINDINGS: Heart size normal. The heart size is exaggerated by low lung volumes. There is no edema or effusion. No focal airspace disease is present. IMPRESSION: 1. Low lung volumes. 2. No acute cardiopulmonary disease. Electronically Signed   By: Marin Robertshristopher  Mattern M.D.   On: 02/19/2017 08:57    Procedures Procedures (including critical care time)  Medications Ordered in ED Medications  ammonia inhalant (  Given 02/19/17 0904)     Initial Impression / Assessment and Plan / ED Course  I have reviewed the triage vital signs and the nursing notes.  Pertinent labs & imaging results that were available during my care of the patient  were reviewed by me and considered in my medical decision making (see chart for details).     46 yo M with a chief complaint of bilateral leg swelling. The patient has multiple visits to the ED for the same. I do not see a chest x-ray or a BNP in the system. As I cannot arouse the patient I will observe him in the ED.  Patient is awake and alert ambulatory. Discharge home.  1:00 PM:  I have discussed the diagnosis/risks/treatment options with the patient and believe the pt to be eligible for discharge home to follow-up with PCP. We also discussed returning to the ED immediately if new or worsening sx occur. We discussed the sx which are most concerning (e.g., sudden worsening pain, fever, inability to tolerate by mouth) that necessitate immediate return. Medications administered to the patient during their visit and any new  prescriptions provided to the patient are listed below.  Medications given during this visit Medications  ammonia inhalant (  Given 02/19/17 0904)     The patient appears reasonably screen and/or stabilized for discharge and I doubt any other medical condition or other [facility] requiring further screening, evaluation, or treatment in the ED at this time prior to discharge.    Final Clinical Impressions(s) / ED Diagnoses   Final diagnoses:  ETOH abuse  Bilateral leg edema    New Prescriptions New Prescriptions   No medications on file     Melene Plan, DO 02/19/17 1300

## 2017-02-19 NOTE — ED Triage Notes (Signed)
Bilateral leg pain for one week with swelling in legs with ETOH tonight has hx of sleep apnea.

## 2017-02-19 NOTE — ED Notes (Signed)
Patient will not wake up, tried shaking patient.

## 2017-02-28 ENCOUNTER — Encounter (HOSPITAL_COMMUNITY): Payer: Self-pay | Admitting: Family Medicine

## 2017-02-28 DIAGNOSIS — I10 Essential (primary) hypertension: Secondary | ICD-10-CM | POA: Insufficient documentation

## 2017-02-28 DIAGNOSIS — F17210 Nicotine dependence, cigarettes, uncomplicated: Secondary | ICD-10-CM | POA: Insufficient documentation

## 2017-02-28 DIAGNOSIS — Z79899 Other long term (current) drug therapy: Secondary | ICD-10-CM | POA: Insufficient documentation

## 2017-02-28 DIAGNOSIS — R609 Edema, unspecified: Secondary | ICD-10-CM | POA: Insufficient documentation

## 2017-02-28 DIAGNOSIS — R2243 Localized swelling, mass and lump, lower limb, bilateral: Secondary | ICD-10-CM | POA: Diagnosis present

## 2017-02-28 NOTE — ED Notes (Signed)
While triaging he falls asleep.

## 2017-02-28 NOTE — ED Triage Notes (Signed)
Patient was picked up from [employer] and transported by [facility]. Patient is complaining of bilateral lower extremity swelling. Patient was seen on 02/19/2017 for bilateral leg for a week earlier and given prescription for Lasix. Patient didn't get Lasix prescription filled.

## 2017-03-01 ENCOUNTER — Emergency Department (HOSPITAL_COMMUNITY)
Admission: EM | Admit: 2017-03-01 | Discharge: 2017-03-01 | Disposition: A | Discharge status: Home / Self Care | Payer: Medicare Other | Attending: Emergency Medicine | Admitting: Emergency Medicine

## 2017-03-01 DIAGNOSIS — R609 Edema, unspecified: Secondary | ICD-10-CM

## 2017-03-01 LAB — CBC WITH DIFFERENTIAL/PLATELET
BASOS PCT: 0 %
Basophils Absolute: 0.1 10*3/uL (ref 0.0–0.1)
Eosinophils Absolute: 0 10*3/uL (ref 0.0–0.7)
Eosinophils Relative: 0 %
HEMATOCRIT: 41.6 % (ref 39.0–52.0)
HEMOGLOBIN: 13.9 g/dL (ref 13.0–17.0)
LYMPHS ABS: 1.4 10*3/uL (ref 0.7–4.0)
LYMPHS PCT: 10 %
MCH: 28 pg (ref 26.0–34.0)
MCHC: 33.4 g/dL (ref 30.0–36.0)
MCV: 83.7 fL (ref 78.0–100.0)
MONOS PCT: 7 %
Monocytes Absolute: 1 10*3/uL (ref 0.1–1.0)
NEUTROS ABS: 12 10*3/uL — AB (ref 1.7–7.7)
NEUTROS PCT: 83 %
Platelets: 141 10*3/uL — ABNORMAL LOW (ref 150–400)
RBC: 4.97 MIL/uL (ref 4.22–5.81)
RDW: 15 % (ref 11.5–15.5)
WBC: 14.4 10*3/uL — ABNORMAL HIGH (ref 4.0–10.5)

## 2017-03-01 LAB — COMPREHENSIVE METABOLIC PANEL
ALBUMIN: 3.3 g/dL — AB (ref 3.5–5.0)
ALT: 19 U/L (ref 17–63)
ANION GAP: 6 (ref 5–15)
AST: 28 U/L (ref 15–41)
Alkaline Phosphatase: 61 U/L (ref 38–126)
BUN: 13 mg/dL (ref 6–20)
CHLORIDE: 97 mmol/L — AB (ref 101–111)
CO2: 27 mmol/L (ref 22–32)
Calcium: 8.4 mg/dL — ABNORMAL LOW (ref 8.9–10.3)
Creatinine, Ser: 1.01 mg/dL (ref 0.61–1.24)
GFR calc Af Amer: >60 mL/min (ref >60)
GFR calc non Af Amer: >60 mL/min (ref >60)
GLUCOSE: 107 mg/dL — AB (ref 65–99)
POTASSIUM: 4 mmol/L (ref 3.5–5.1)
SODIUM: 130 mmol/L — AB (ref 135–145)
TOTAL PROTEIN: 6.7 g/dL (ref 6.5–8.1)
Total Bilirubin: 0.5 mg/dL (ref 0.3–1.2)

## 2017-03-01 LAB — D-DIMER, QUANTITATIVE: D-Dimer, Quant: 0.77 ug/mL-FEU — ABNORMAL HIGH (ref 0.00–0.50)

## 2017-03-01 LAB — ETHANOL: Alcohol, Ethyl (B): <5 mg/dL (ref <5)

## 2017-03-01 NOTE — ED Notes (Signed)
Asked pt to insert PIV which is needed for CT of his chest, he stated "you did all your damn tests, I want something to eat now"/ Dr Lynelle Doctor made aware.

## 2017-03-01 NOTE — ED Notes (Signed)
Pt woke up, was directed to bathroom to clean up and given paper scrubs to put on. Pt is asking for food and drink, explained to pt that he can get something to drink after he gets back to bed, pt started yelling and cursing.

## 2017-03-01 NOTE — ED Notes (Signed)
Pt is snoring loudly in the lobby and has urinated on himself.

## 2017-03-01 NOTE — ED Provider Notes (Signed)
[facility] DEPT Provider Note   CSN: [id number] Arrival date & time: 02/28/17  2048  Time seen 1:50 AM   History   Chief Complaint Chief Complaint  Patient presents with  . Leg Swelling   Level V caveat for sleeping  HPI [name] [name] is a 46 y.o. male.  HPI  patient is a frequent ED visitor, he mainly comes to the ED and sleeps all night and then leaves in the morning. He is here frequently with complaints of swelling of his legs. When I entered the room he snoring loudly. When I can get him to awaken although only briefly he states he's having problems his legs. He states they are swollen and feel heavy for 4 days. He denies fever, chest pain, or shortness of breath. Patient mainly is sleeping and snoring.  PCP Fleet Contras, MD   Past Medical History:  Diagnosis Date  . Bipolar depression (HCC)   . Hypertension   . Pneumonia    had aspiration pneumonia 5/14-on vent  . Schizophrenia [facility])     Patient Active Problem List   Diagnosis Date Noted  . Somnolence 01/09/2017  . Paranoid schizophrenia (HCC) 04/20/2013  . Acute respiratory failure with hypercapnia (HCC) 11/17/2012  . Encephalopathy acute 11/17/2012    Past Surgical History:  Procedure Laterality Date  . arm surgery     hurt his arm 25 hr ago  . OPEN REDUCTION INTERNAL FIXATION (ORIF) PROXIMAL PHALANX Right 04/02/2013   Procedure: OPEN REDUCTION INTERNAL FIXATION (ORIF) RIGHT SMALL FINGER;  Surgeon: Tami Ribas, MD;  Location: [facility];  Service: Orthopedics;  Laterality: Right;       Home Medications    Prior to Admission medications   Medication Sig Start Date End Date Taking? Authorizing Provider  albuterol (PROVENTIL HFA;VENTOLIN HFA) 108 (90 Base) MCG/ACT inhaler Inhale 1-2 puffs into the lungs every 6 (six) hours as needed for wheezing or shortness of breath. 12/22/16   Arby Barrette, MD  amLODipine (NORVASC) 10 MG tablet Take 1 tablet (10 mg total) by mouth daily. 01/21/17    Lorre Nick, MD  carbamazepine (TEGRETOL) 200 MG tablet Take 1 tablet (200 mg total) by mouth 2 (two) times daily. Patient not taking: Reported on 11/25/2016 09/02/16   Charlynne Pander, MD  furosemide (LASIX) 20 MG tablet Take 1 tablet (20 mg total) by mouth daily. 01/21/17   Lorre Nick, MD  pantoprazole (PROTONIX) 20 MG tablet Take 1 tablet (20 mg total) by mouth daily. 12/22/16   Arby Barrette, MD  potassium chloride (K-DUR) 10 MEQ tablet Take 1 tablet (10 mEq total) by mouth daily. 01/21/17   Lorre Nick, MD  QUEtiapine (SEROQUEL) 50 MG tablet Take 1 tablet (50 mg total) by mouth 3 (three) times daily. 12/22/16   Arby Barrette, MD    Family History History reviewed. No pertinent family history.  Social History Social History  Substance Use Topics  . Smoking status: Current Every Day Smoker    Types: Cigarettes  . Smokeless tobacco: Never Used  . Alcohol use Yes     Allergies   Tomato   Review of Systems Review of Systems  Unable to perform ROS: Other     Physical Exam Updated Vital Signs BP (!) 164/97 (BP Location: Left Arm)   Pulse (!) 113   Temp 98.5 F (36.9 C) (Oral)   Resp 16   SpO2 100%   Vital signs normal except for hypertension and tachycardia   Physical Exam  Constitutional: He  is oriented to person, place, and time. He appears well-developed and well-nourished.  Non-toxic appearance. He does not appear ill. No distress.  HENT:  Head: Normocephalic and atraumatic.  Right Ear: External ear normal.  Left Ear: External ear normal.  Nose: Nose normal. No mucosal edema or rhinorrhea.  Mouth/Throat: Oropharynx is clear and moist and mucous membranes are normal. No dental abscesses or uvula swelling.  Eyes: Pupils are equal, round, and reactive to light. Conjunctivae and EOM are normal.  Neck: Normal range of motion and full passive range of motion without pain. Neck supple.  Cardiovascular: Normal rate, regular rhythm and normal heart sounds.   Exam reveals no gallop and no friction rub.   No murmur heard. Pulmonary/Chest: Effort normal and breath sounds normal. No respiratory distress. He has no wheezes. He has no rhonchi. He has no rales. He exhibits no tenderness and no crepitus.  Abdominal: Soft. Normal appearance and bowel sounds are normal. He exhibits no distension. There is no tenderness. There is no rebound and no guarding.  Musculoskeletal: Normal range of motion. He exhibits no edema or tenderness.  Moves all extremities well.   Neurological: He is alert and oriented to person, place, and time. He has normal strength. No cranial nerve deficit.  Skin: Skin is warm, dry and intact. No rash noted. No erythema. No pallor.  Psychiatric: He has a normal mood and affect. His speech is normal and behavior is normal. His mood appears not anxious.  Nursing note and vitals reviewed.    ED Treatments / Results  Labs (all labs ordered are listed, but only abnormal results are displayed) Results for orders placed or performed during the hospital encounter of 03/01/17  Comprehensive metabolic panel  Result Value Ref Range   Sodium 130 (L) 135 - 145 mmol/L   Potassium 4.0 3.5 - 5.1 mmol/L   Chloride 97 (L) 101 - 111 mmol/L   CO2 27 22 - 32 mmol/L   Glucose, Bld 107 (H) 65 - 99 mg/dL   BUN 13 6 - 20 mg/dL   Creatinine, Ser 4.09 0.61 - 1.24 mg/dL   Calcium 8.4 (L) 8.9 - 10.3 mg/dL   Total Protein 6.7 6.5 - 8.1 g/dL   Albumin 3.3 (L) 3.5 - 5.0 g/dL   AST 28 15 - 41 U/L   ALT 19 17 - 63 U/L   Alkaline Phosphatase 61 38 - 126 U/L   Total Bilirubin 0.5 0.3 - 1.2 mg/dL   GFR calc non Af Amer >60 >60 mL/min   GFR calc Af Amer >60 >60 mL/min   Anion gap 6 5 - 15  Ethanol  Result Value Ref Range   Alcohol, Ethyl (B) <5 <5 mg/dL  CBC with Differential  Result Value Ref Range   WBC 14.4 (H) 4.0 - 10.5 K/uL   RBC 4.97 4.22 - 5.81 MIL/uL   Hemoglobin 13.9 13.0 - 17.0 g/dL   HCT 81.1 91.4 - 78.2 %   MCV 83.7 78.0 - 100.0 fL   MCH  28.0 26.0 - 34.0 pg   MCHC 33.4 30.0 - 36.0 g/dL   RDW 95.6 21.3 - 08.6 %   Platelets 141 (L) 150 - 400 K/uL   Neutrophils Relative % 83 %   Neutro Abs 12.0 (H) 1.7 - 7.7 K/uL   Lymphocytes Relative 10 %   Lymphs Abs 1.4 0.7 - 4.0 K/uL   Monocytes Relative 7 %   Monocytes Absolute 1.0 0.1 - 1.0 K/uL   Eosinophils Relative 0 %  Eosinophils Absolute 0.0 0.0 - 0.7 K/uL   Basophils Relative 0 %   Basophils Absolute 0.1 0.0 - 0.1 K/uL  D-dimer, quantitative  Result Value Ref Range   D-Dimer, Quant 0.77 (H) 0.00 - 0.50 ug/mL-FEU   Laboratory interpretation all normal except elevated ddimer    EKG  EKG Interpretation None       Radiology No results found.  Procedures Procedures (including critical care time)  Medications Ordered in ED Medications - No data to display   Initial Impression / Assessment and Plan / ED Course  I have reviewed the triage vital signs and the nursing notes.  Pertinent labs & imaging results that were available during my care of the patient were reviewed by me and considered in my medical decision making (see chart for details).    Patient has been snoring in the hall since he arrived in the ED. He refuses to have the IV done for further testing. Patient just wants to eat. He was discharged. Please note patient has been incontinent in his stretcher multiple times with urine.  Final Clinical Impressions(s) / ED Diagnoses   Final diagnoses:  Peripheral edema    Plan discharge  Devoria Albe, MD, Concha Pyo, MD 03/01/17 [phone]

## 2017-03-01 NOTE — ED Notes (Signed)
Pt still asleep, has urinated on himself, was able to wake him up and ask to go to bathroom to clean up and change, pt refused.

## 2017-03-01 NOTE — Discharge Instructions (Signed)
Recheck if you get chest pain or shortness of breath. You need to follow up with Dr Concepcion Elk about your swollen legs. If you don't have a place to stay during the day and go to the [facility].

## 2017-03-01 NOTE — ED Notes (Signed)
Assumed care of pt in the hallway bed, he is asleep, loudly snoring.

## 2017-03-01 NOTE — ED Notes (Signed)
Pt is refusing IV at this time.    

## 2017-03-01 NOTE — ED Notes (Signed)
Woke patient up to give a breakfast tray. Patient is currently eating.

## 2017-03-04 ENCOUNTER — Encounter (HOSPITAL_COMMUNITY): Payer: Self-pay | Admitting: Emergency Medicine

## 2017-03-04 ENCOUNTER — Emergency Department (HOSPITAL_COMMUNITY)
Admission: EM | Admit: 2017-03-04 | Discharge: 2017-03-04 | Disposition: A | Discharge status: Home / Self Care | Payer: Medicare Other | Attending: Emergency Medicine | Admitting: Emergency Medicine

## 2017-03-04 DIAGNOSIS — F17210 Nicotine dependence, cigarettes, uncomplicated: Secondary | ICD-10-CM | POA: Insufficient documentation

## 2017-03-04 DIAGNOSIS — F209 Schizophrenia, unspecified: Secondary | ICD-10-CM | POA: Diagnosis not present

## 2017-03-04 DIAGNOSIS — I10 Essential (primary) hypertension: Secondary | ICD-10-CM | POA: Insufficient documentation

## 2017-03-04 DIAGNOSIS — R600 Localized edema: Secondary | ICD-10-CM | POA: Diagnosis present

## 2017-03-04 NOTE — ED Triage Notes (Signed)
Pt arrived via EMS. EMS reports the patient told them that he has had ongoing feet and ankle swelling. Was seen here for same recently. Pt homeless, and told EMS he is on his feet all day long. Pt reports he did not sleep last night, and was sleeping during transport.

## 2017-03-04 NOTE — ED Provider Notes (Signed)
[facility] DEPT Provider Note   CSN: [id number] Arrival date & time: 03/04/17  0932     History   Chief Complaint Chief Complaint  Patient presents with  . Joint Swelling   Level V caveat for sleeping  HPI  [name] [name] is a 46 y.o. Male with a history of HTN, bipolar depression and schizophrenia, presents with lower extremity edema. Patient has been seen n the ED with this complaint 8 times in the last three months. He was seen in the ED 3 days ago, but refused labs or an IV. Patient reports he has swelling in his legs that's been there for 1-2 months. Patient is sleeping and snoring through the majority of the evaluation, he will wake up briefly to answer some questions and then immediately returns to sleep. He is accompanied by his step-mother who reports she is concerned he is not taking any of his medications, she reports 2 months ago he did not have this severe swelling. Patient reports he lost all of his medications. Patient was in a boarding home, but came back to [place] and has been on the streets. Step-mother says he walks around outside all night and does not sleep.      Past Medical History:  Diagnosis Date  . Bipolar depression (HCC)   . Hypertension   . Pneumonia    had aspiration pneumonia 5/14-on vent  . Schizophrenia [facility])     Patient Active Problem List   Diagnosis Date Noted  . Somnolence 01/09/2017  . Paranoid schizophrenia (HCC) 04/20/2013  . Acute respiratory failure with hypercapnia (HCC) 11/17/2012  . Encephalopathy acute 11/17/2012    Past Surgical History:  Procedure Laterality Date  . arm surgery     hurt his arm 25 hr ago  . OPEN REDUCTION INTERNAL FIXATION (ORIF) PROXIMAL PHALANX Right 04/02/2013   Procedure: OPEN REDUCTION INTERNAL FIXATION (ORIF) RIGHT SMALL FINGER;  Surgeon: Tami Ribas, MD;  Location: [facility];  Service: Orthopedics;  Laterality: Right;       Home Medications    Prior to Admission  medications   Medication Sig Start Date End Date Taking? Authorizing Provider  albuterol (PROVENTIL HFA;VENTOLIN HFA) 108 (90 Base) MCG/ACT inhaler Inhale 1-2 puffs into the lungs every 6 (six) hours as needed for wheezing or shortness of breath. Patient not taking: Reported on 03/04/2017 12/22/16   Arby Barrette, MD  amLODipine (NORVASC) 10 MG tablet Take 1 tablet (10 mg total) by mouth daily. Patient not taking: Reported on 03/04/2017 01/21/17   Lorre Nick, MD  carbamazepine (TEGRETOL) 200 MG tablet Take 1 tablet (200 mg total) by mouth 2 (two) times daily. Patient not taking: Reported on 11/25/2016 09/02/16   Charlynne Pander, MD  furosemide (LASIX) 20 MG tablet Take 1 tablet (20 mg total) by mouth daily. Patient not taking: Reported on 03/04/2017 01/21/17   Lorre Nick, MD  pantoprazole (PROTONIX) 20 MG tablet Take 1 tablet (20 mg total) by mouth daily. Patient not taking: Reported on 03/04/2017 12/22/16   Arby Barrette, MD  potassium chloride (K-DUR) 10 MEQ tablet Take 1 tablet (10 mEq total) by mouth daily. Patient not taking: Reported on 03/04/2017 01/21/17   Lorre Nick, MD  QUEtiapine (SEROQUEL) 50 MG tablet Take 1 tablet (50 mg total) by mouth 3 (three) times daily. Patient not taking: Reported on 03/04/2017 12/22/16   Arby Barrette, MD    Family History History reviewed. No pertinent family history.  Social History Social History  Substance Use Topics  .  Smoking status: Current Every Day Smoker    Types: Cigarettes  . Smokeless tobacco: Never Used  . Alcohol use Yes     Allergies   Tomato   Review of Systems Review of Systems  Unable to perform ROS: Other (Patient sleeping and snoring)     Physical Exam Updated Vital Signs BP (!) 145/86 (BP Location: Right Arm)   Pulse 87   Temp 99 F (37.2 C) (Oral)   Resp (!) 22   SpO2 100%   Physical Exam  Constitutional: He appears well-developed and well-nourished. No distress.  HENT:  Head: Normocephalic and  atraumatic.  Eyes: Right eye exhibits no discharge. Left eye exhibits no discharge.  Cardiovascular: Normal rate, regular rhythm, normal heart sounds and intact distal pulses.   Pulmonary/Chest: Effort normal and breath sounds normal. No respiratory distress.  Exam limited by body habitus and snoring  Musculoskeletal:  Significant bilateral lower extremity edema  Neurological: He is alert. Coordination normal.  Skin: Skin is warm and dry. Capillary refill takes less than 2 seconds. He is not diaphoretic.  Psychiatric: He has a normal mood and affect. His behavior is normal.  Nursing note and vitals reviewed.    ED Treatments / Results  Labs (all labs ordered are listed, but only abnormal results are displayed) Labs Reviewed - No data to display  EKG  EKG Interpretation None       Radiology No results found.  Procedures Procedures (including critical care time)  Medications Ordered in ED Medications - No data to display   Initial Impression / Assessment and Plan / ED Course  I have reviewed the triage vital signs and the nursing notes.  Pertinent labs & imaging results that were available during my care of the patient were reviewed by me and considered in my medical decision making (see chart for details).  Patient with numerous ED visits, unable to reliably take his medications. Consult to case management for potential placement. 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   Case manager spoke with DSS and reports patient has a boarding house here in [place] that he should be discharged to  and she will try and arrange transport, patient is ambulatory.  Patient discussed with Dr. Jeraldine Loots, who saw patient as well and agrees with plan.   At shift change care was transferred to Dr. Ethelda Chick who will follow, re-evaulate and determine disposition.     Final Clinical Impressions(s) / ED Diagnoses    Final diagnoses:  None    New Prescriptions New Prescriptions   No medications on file     Legrand Rams 03/04/17 1646    Gerhard Munch, MD 03/08/17 2333

## 2017-03-04 NOTE — ED Triage Notes (Signed)
Family arrived and reports pt has not been taking any of his medication including his lasix.

## 2017-03-04 NOTE — Discharge Instructions (Signed)
Take your medications

## 2017-03-04 NOTE — Clinical Social Work Note (Signed)
Clinical Social Work Assessment  Patient Details  Name: [name] [name] MRN: [id number] Date of Birth: Jul 29, 1970  Date of referral:  03/04/17               Reason for consult:  Housing Concerns/Homelessness                Permission sought to share information with:  Family Supports Permission granted to share information::  Yes, Verbal Permission Granted  Name::     Veronia Beets  Agency::     Relationship::  Mother  Contact Information:  [phone]  Housing/Transportation Living arrangements for the past 2 months:  Homeless, [employer] (Patient recently left boarding house approx 1 week ago) Source of Information:  Patient, Parent (Mom - Veronia Beets) Patient Interpreter Needed:  None Criminal Activity/Legal Involvement Pertinent to Current Situation/Hospitalization:  No - Comment as needed Significant Relationships:  Siblings, Parents Lives with:  Self Do you feel safe going back to the place where you live?  Yes Need for family participation in patient care:  No (Coment)  Care giving concerns:  Patient presenting with lower extremity edema. Patient's mother reported that she was concerned that patient has been staying outside and sleeping on concrete. Patient's mother reported that patient is a ward of the state and that they have not found placement for patient. She reported that they placed patient at a boarding house and that it is not safe for patient. Patient's mother reported that he is unable to complete ADLs such as bathing and taking medications without assistance.    Social Worker assessment / plan:  CSW spoke with patient's mother at bedside, patient was in a deep sleep. Patient's mother awoke patient and he answered a few questions. Patient's mother reported that patient is a ward of the state and that patient was placed at boarding house that she does not feel is safe. Patient reported that he does not get along with other boarding house members. Patient's mother  reported that he has been sleeping outside and she is concerned about patient not taking his medications. Patient's mother reported that patient is unable to stay with her because he is not on her lease. CSW agreed to contact APS to inquire about discharge plans. Patient granted CSW verbal permission to update his mother.   CSW contacted APS and spoke with staff named Toya Smothers. CSW reported patient's mothers concerns. Staff confirmed that patient has been placed at a boarding house at [address] [facility] in [place] and agreed to contact CSW after reaching out to patient's DSS guardian.   CSW contacted by APS worker Toya Smothers who reported that she was unable to reach patient's guardian but she left a message with her. APS worker reported that patient could discharge back to his boarding house or to a shelter, she reported that it was patient's decision. She reported that patient has an ACT Team ([facility]) that patient is active with. She reported that patient was placed at the boarding house due to need for immediate placement. She agreed to contact patient's mother and provide update.   CSW contacted patient's ACT Team ([facility]) and spoke with staff member named Contractor. CSW inquired about transportation for patient. Staff reported that patient was more than capable to get around.   CSW spoke with patient and inquired where he wanted to be discharged to, patient reported shelter. CSW updated patient's RN. Patient's mother at bedside and updated.   Employment status:  Engineer, maintenance information:  Medicaid In [place], [place] PT Recommendations:  Not assessed at this time Information / Referral to community resources:  APS (Comment Required: [place], Name & Number of worker spoken with) [facility]   Patient/Family's Response to care: Patient agreed with discharge options.   Patient/Family's Understanding of and Emotional Response to  Diagnosis, Current Treatment, and Prognosis:  Patient was asleep for a majority of the time during hospital visit. Patient only woke to answer simple questions. Patient verbalized understanding of plan to discharge to shelter. Patient's mother verbalized concern with patient's discharge plan. CSW informed patient's mother that discharge options were provided by DSS.   Emotional Assessment Appearance:  Disheveled Attitude/Demeanor/Rapport:  Lethargic Affect (typically observed):  Unable to Assess Orientation:  Oriented to Self, Oriented to Place, Oriented to Situation Alcohol / Substance use:  Not Applicable Psych involvement (Current and /or in the community):  Yes (Comment)  Discharge Needs  Concerns to be addressed:  No discharge needs identified Readmission within the last 30 days:  No Current discharge risk:  None Barriers to Discharge:  No Barriers Identified   Antionette Poles, LCSW 03/04/2017, 4:52 PM

## 2017-03-04 NOTE — ED Notes (Signed)
Bed: [facility] Expected date:  Expected time:  Means of arrival:  Comments: 

## 2017-03-04 NOTE — ED Notes (Signed)
Patient and patient mother spoke with social work regarding patient status. Pt was informed that he would need to return to his boarding or he can return to a shelter. Pt mother was not happy with discision and wanted pt admitted. Mother stated "You are only treating him like this cause of the color of our skin." Patient given discharge instructions and bus pass and left with mother. Patient ambulated with steady gait.

## 2017-03-04 NOTE — ED Provider Notes (Signed)
  Physical Exam  BP 138/81 (BP Location: Left Arm)   Pulse 70   Temp 98.7 F (37.1 C) (Oral)   Resp 18   Ht 6\' 3"  (1.905 m)   Wt 117.9 kg (260 lb)   SpO2 97%   BMI 32.50 kg/m   Physical Exam  ED Course  Procedures  MDM Patient has been seen by social work and cleared for discharge.       Benjiman Core, MD 03/04/17 1726

## 2017-03-12 ENCOUNTER — Encounter (HOSPITAL_COMMUNITY): Payer: Self-pay

## 2017-03-12 ENCOUNTER — Emergency Department (HOSPITAL_COMMUNITY)
Admission: EM | Admit: 2017-03-12 | Discharge: 2017-03-12 | Discharge status: Against Medical Advice | Payer: Medicare Other | Attending: Emergency Medicine | Admitting: Emergency Medicine

## 2017-03-12 ENCOUNTER — Emergency Department (HOSPITAL_COMMUNITY)
Admission: EM | Admit: 2017-03-12 | Discharge: 2017-03-12 | Disposition: A | Discharge status: Home / Self Care | Payer: Medicare Other | Source: Home / Self Care

## 2017-03-12 ENCOUNTER — Encounter (HOSPITAL_COMMUNITY): Payer: Self-pay | Admitting: *Deleted

## 2017-03-12 ENCOUNTER — Inpatient Hospital Stay (HOSPITAL_COMMUNITY): Admit: 2017-03-12 | Payer: Self-pay

## 2017-03-12 DIAGNOSIS — I10 Essential (primary) hypertension: Secondary | ICD-10-CM | POA: Insufficient documentation

## 2017-03-12 DIAGNOSIS — F17210 Nicotine dependence, cigarettes, uncomplicated: Secondary | ICD-10-CM | POA: Diagnosis not present

## 2017-03-12 DIAGNOSIS — R600 Localized edema: Secondary | ICD-10-CM | POA: Diagnosis not present

## 2017-03-12 DIAGNOSIS — Z5321 Procedure and treatment not carried out due to patient leaving prior to being seen by health care provider: Secondary | ICD-10-CM | POA: Insufficient documentation

## 2017-03-12 DIAGNOSIS — R609 Edema, unspecified: Secondary | ICD-10-CM

## 2017-03-12 DIAGNOSIS — R2243 Localized swelling, mass and lump, lower limb, bilateral: Secondary | ICD-10-CM | POA: Insufficient documentation

## 2017-03-12 NOTE — Discharge Instructions (Signed)
We are concerned that you may have a blood clot in her legs. There is a possibility that you could die or lose her legs as result of blood clots Go to another emergency department or return if you decide that you want further evaluation.

## 2017-03-12 NOTE — ED Notes (Signed)
Patient did not respond when trying to obtain V/S in the lobby.  Patient did not inform staff of leaving the facility.  

## 2017-03-12 NOTE — ED Triage Notes (Signed)
Patient is alert and oriented x4.  He is being seen for bilateral leg swelling.  This is a chronic issue with this patient.  Currently he rates his pain 8 of 10.

## 2017-03-12 NOTE — ED Triage Notes (Signed)
[facility] c/o bilateral foot pain and swelling x 1 month.

## 2017-03-12 NOTE — ED Notes (Signed)
Pt given discharge instructions. Pt verbalized understanding.

## 2017-03-12 NOTE — ED Provider Notes (Addendum)
[facility] DEPT Provider Note   CSN: [id number] Arrival date & time: 03/12/17  0743     History   Chief Complaint Chief Complaint  Patient presents with  . Leg Swelling    HPI [name] [name] is a 46 y.o. male. Patient complains of bilateral leg edema for 3 months. No other associated complaint. No shortness of breath. Denies noncompliance with his medications. Patient was seen here 03/04/2017 for same complaint. Treated and released. Of note patient had a d-dimer which was elevated. He did not receive further studies to check for phlebitis. HPI  Past Medical History:  Diagnosis Date  . Bipolar depression (HCC)   . Hypertension   . Pneumonia    had aspiration pneumonia 5/14-on vent  . Schizophrenia [facility])     Patient Active Problem List   Diagnosis Date Noted  . Somnolence 01/09/2017  . Paranoid schizophrenia (HCC) 04/20/2013  . Acute respiratory failure with hypercapnia (HCC) 11/17/2012  . Encephalopathy acute 11/17/2012    Past Surgical History:  Procedure Laterality Date  . arm surgery     hurt his arm 25 hr ago  . OPEN REDUCTION INTERNAL FIXATION (ORIF) PROXIMAL PHALANX Right 04/02/2013   Procedure: OPEN REDUCTION INTERNAL FIXATION (ORIF) RIGHT SMALL FINGER;  Surgeon: Tami Ribas, MD;  Location: [facility];  Service: Orthopedics;  Laterality: Right;       Home Medications    Prior to Admission medications   Medication Sig Start Date End Date Taking? Authorizing Provider  albuterol (PROVENTIL HFA;VENTOLIN HFA) 108 (90 Base) MCG/ACT inhaler Inhale 1-2 puffs into the lungs every 6 (six) hours as needed for wheezing or shortness of breath. Patient not taking: Reported on 03/04/2017 12/22/16   Arby Barrette, MD  amLODipine (NORVASC) 10 MG tablet Take 1 tablet (10 mg total) by mouth daily. Patient not taking: Reported on 03/04/2017 01/21/17   Lorre Nick, MD  carbamazepine (TEGRETOL) 200 MG tablet Take 1 tablet (200 mg total) by mouth 2 (two)  times daily. Patient not taking: Reported on 11/25/2016 09/02/16   Charlynne Pander, MD  furosemide (LASIX) 20 MG tablet Take 1 tablet (20 mg total) by mouth daily. Patient not taking: Reported on 03/04/2017 01/21/17   Lorre Nick, MD  pantoprazole (PROTONIX) 20 MG tablet Take 1 tablet (20 mg total) by mouth daily. Patient not taking: Reported on 03/04/2017 12/22/16   Arby Barrette, MD  potassium chloride (K-DUR) 10 MEQ tablet Take 1 tablet (10 mEq total) by mouth daily. Patient not taking: Reported on 03/04/2017 01/21/17   Lorre Nick, MD  QUEtiapine (SEROQUEL) 50 MG tablet Take 1 tablet (50 mg total) by mouth 3 (three) times daily. Patient not taking: Reported on 03/04/2017 12/22/16   Arby Barrette, MD    Family History History reviewed. No pertinent family history.  Social History Social History  Substance Use Topics  . Smoking status: Current Every Day Smoker    Types: Cigarettes  . Smokeless tobacco: Never Used  . Alcohol use Yes   Currently resides in apartment with companions  Allergies   Tomato   Review of Systems Review of Systems  Constitutional: Negative.   HENT: Negative.   Respiratory: Negative.   Cardiovascular: Positive for leg swelling.  Gastrointestinal: Negative.   Musculoskeletal: Negative.   Skin: Negative.   Neurological: Negative.   Psychiatric/Behavioral: Negative.   All other systems reviewed and are negative.    Physical Exam Updated Vital Signs Ht 6\' 3"  (1.905 m)   Wt 117.9 kg (260 lb)  BMI 32.50 kg/m   Physical Exam  Constitutional: He appears well-developed and well-nourished.  HENT:  Head: Normocephalic and atraumatic.  Eyes: Pupils are equal, round, and reactive to light. Conjunctivae are normal.  Neck: Neck supple. No tracheal deviation present. No thyromegaly present.  Cardiovascular: Normal rate and regular rhythm.   No murmur heard. Pulmonary/Chest: Effort normal and breath sounds normal.  Abdominal: Soft. Bowel sounds  are normal. He exhibits no distension. There is no tenderness.  Musculoskeletal: Normal range of motion. He exhibits edema. He exhibits no tenderness.  3+ pretibial pitting edema bilaterally  Neurological: He is alert. Coordination normal.  Skin: Skin is warm and dry. No rash noted.  Psychiatric: He has a normal mood and affect.  Nursing note and vitals reviewed.    ED Treatments / Results  Labs (all labs ordered are listed, but only abnormal results are displayed) Labs Reviewed - No data to display  EKG  EKG Interpretation None       Radiology No results found.  Procedures Procedures (including critical care time)  Medications Ordered in ED Medications - No data to display   Initial Impression / Assessment and Plan / ED Course  I have reviewed the triage vital signs and the nursing notes.  Pertinent labs & imaging results that were available during my care of the patient were reviewed by me and considered in my medical decision making (see chart for details).     Patient wishes to leave AGAINST MEDICAL ADVICE. I discussed with him the possibility of blood clots which is potentially life threatening. He is capable of understanding the concepts. He is advised to go to another emergency department or to see his doctor ED record from 03/04/2017 and other old records reviewed. Of note he reports that patient was signed out to me. I had no encounter with patient that day.Nor does schedule from 03/04/17 supprt that pt would have been signed out to me Final Clinical Impressions(s) / ED Diagnoses  Diagnosis peripheral edema Final diagnoses:  None    New Prescriptions New Prescriptions   No medications on file     Doug SouJacubowitz, Hannah Strader, MD 03/12/17 1137    Doug SouJacubowitz, Chisom Muntean, MD 03/12/17 1140

## 2017-03-14 ENCOUNTER — Encounter (HOSPITAL_COMMUNITY): Payer: Self-pay | Admitting: Emergency Medicine

## 2017-03-14 ENCOUNTER — Emergency Department (HOSPITAL_COMMUNITY)
Admission: EM | Admit: 2017-03-14 | Discharge: 2017-03-15 | Disposition: A | Discharge status: Home / Self Care | Payer: Medicare Other | Attending: Emergency Medicine | Admitting: Emergency Medicine

## 2017-03-14 DIAGNOSIS — R600 Localized edema: Secondary | ICD-10-CM | POA: Diagnosis present

## 2017-03-14 DIAGNOSIS — I10 Essential (primary) hypertension: Secondary | ICD-10-CM | POA: Insufficient documentation

## 2017-03-14 DIAGNOSIS — F17210 Nicotine dependence, cigarettes, uncomplicated: Secondary | ICD-10-CM | POA: Diagnosis not present

## 2017-03-14 LAB — COMPREHENSIVE METABOLIC PANEL
ALBUMIN: 2.9 g/dL — AB (ref 3.5–5.0)
ALT: 20 U/L (ref 17–63)
AST: 35 U/L (ref 15–41)
Alkaline Phosphatase: 56 U/L (ref 38–126)
Anion gap: 7 (ref 5–15)
BILIRUBIN TOTAL: 0.7 mg/dL (ref 0.3–1.2)
BUN: 10 mg/dL (ref 6–20)
CHLORIDE: 103 mmol/L (ref 101–111)
CO2: 25 mmol/L (ref 22–32)
CREATININE: 0.65 mg/dL (ref 0.61–1.24)
Calcium: 8.6 mg/dL — ABNORMAL LOW (ref 8.9–10.3)
GFR calc Af Amer: >60 mL/min (ref >60)
GLUCOSE: 87 mg/dL (ref 65–99)
Potassium: 3.9 mmol/L (ref 3.5–5.1)
Sodium: 135 mmol/L (ref 135–145)
Total Protein: 6.8 g/dL (ref 6.5–8.1)

## 2017-03-14 LAB — CBC WITH DIFFERENTIAL/PLATELET
BASOS ABS: 0 10*3/uL (ref 0.0–0.1)
Basophils Relative: 0 %
Eosinophils Absolute: 0.2 10*3/uL (ref 0.0–0.7)
Eosinophils Relative: 2 %
HEMATOCRIT: 36.8 % — AB (ref 39.0–52.0)
Hemoglobin: 12.2 g/dL — ABNORMAL LOW (ref 13.0–17.0)
LYMPHS PCT: 14 %
Lymphs Abs: 1.4 10*3/uL (ref 0.7–4.0)
MCH: 27.2 pg (ref 26.0–34.0)
MCHC: 33.2 g/dL (ref 30.0–36.0)
MCV: 82.1 fL (ref 78.0–100.0)
Monocytes Absolute: 0.9 10*3/uL (ref 0.1–1.0)
Monocytes Relative: 9 %
NEUTROS ABS: 7.4 10*3/uL (ref 1.7–7.7)
Neutrophils Relative %: 75 %
PLATELETS: 223 10*3/uL (ref 150–400)
RBC: 4.48 MIL/uL (ref 4.22–5.81)
RDW: 14.7 % (ref 11.5–15.5)
WBC: 9.8 10*3/uL (ref 4.0–10.5)

## 2017-03-14 LAB — BRAIN NATRIURETIC PEPTIDE: B NATRIURETIC PEPTIDE 5: 24.5 pg/mL (ref 0.0–100.0)

## 2017-03-14 NOTE — ED Provider Notes (Signed)
[facility] Provider Note   CSN: [id number] Arrival date & time: 03/14/17  1417     History   Chief Complaint No chief complaint on file.   HPI [name] [name] is a 46 y.o. male who presents with chronic bilateral lower extremity edema. Patient reports that this is been ongoing for the last several months. Patient denies any new symptoms or changes in symptoms. Patient states that he has still been able to walk despite symptoms. Patient denies any numbness/weakness of the legs. Patient denies any redness or warmth of the bilateral lower extremity. He was last seen in the ED on 03/12/17 for same symptoms. At that time he left AMA without any further medical treatment. Patient denies any fever, chills, chest pain, difficulty breathing, leg wounds, abdominal pain, nausea/vomiting, dysuria, hematuria, back pain, neck pain. Patient denies any alcohol use, cocaine, heroine, marijuana use. Patient denies any recent surgery, long travel, immobilization, history of blood clots.   The history is provided by the patient.    Past Medical History:  Diagnosis Date  . Bipolar depression (HCC)   . Hypertension   . Pneumonia    had aspiration pneumonia 5/14-on vent  . Schizophrenia [facility])     Patient Active Problem List   Diagnosis Date Noted  . Somnolence 01/09/2017  . Paranoid schizophrenia (HCC) 04/20/2013  . Acute respiratory failure with hypercapnia (HCC) 11/17/2012  . Encephalopathy acute 11/17/2012    Past Surgical History:  Procedure Laterality Date  . arm surgery     hurt his arm 25 hr ago  . OPEN REDUCTION INTERNAL FIXATION (ORIF) PROXIMAL PHALANX Right 04/02/2013   Procedure: OPEN REDUCTION INTERNAL FIXATION (ORIF) RIGHT SMALL FINGER;  Surgeon: Tami Ribas, MD;  Location: [facility];  Service: Orthopedics;  Laterality: Right;       Home Medications    Prior to Admission medications   Medication Sig Start Date End Date Taking? Authorizing Provider    albuterol (PROVENTIL HFA;VENTOLIN HFA) 108 (90 Base) MCG/ACT inhaler Inhale 1-2 puffs into the lungs every 6 (six) hours as needed for wheezing or shortness of breath. Patient not taking: Reported on 03/04/2017 12/22/16   Arby Barrette, MD  amLODipine (NORVASC) 10 MG tablet Take 1 tablet (10 mg total) by mouth daily. Patient not taking: Reported on 03/04/2017 01/21/17   Lorre Nick, MD  carbamazepine (TEGRETOL) 200 MG tablet Take 1 tablet (200 mg total) by mouth 2 (two) times daily. Patient not taking: Reported on 11/25/2016 09/02/16   Charlynne Pander, MD  furosemide (LASIX) 20 MG tablet Take 1 tablet (20 mg total) by mouth daily. Patient not taking: Reported on 03/04/2017 01/21/17   Lorre Nick, MD  pantoprazole (PROTONIX) 20 MG tablet Take 1 tablet (20 mg total) by mouth daily. Patient not taking: Reported on 03/04/2017 12/22/16   Arby Barrette, MD  potassium chloride (K-DUR) 10 MEQ tablet Take 1 tablet (10 mEq total) by mouth daily. Patient not taking: Reported on 03/04/2017 01/21/17   Lorre Nick, MD  QUEtiapine (SEROQUEL) 50 MG tablet Take 1 tablet (50 mg total) by mouth 3 (three) times daily. Patient not taking: Reported on 03/04/2017 12/22/16   Arby Barrette, MD    Family History No family history on file.  Social History Social History  Substance Use Topics  . Smoking status: Current Every Day Smoker    Types: Cigarettes  . Smokeless tobacco: Never Used  . Alcohol use Yes     Allergies   Tomato   Review of Systems  Review of Systems  Constitutional: Negative for chills and fever.  Respiratory: Negative for cough and shortness of breath.   Cardiovascular: Positive for leg swelling (bilateral). Negative for chest pain.  Gastrointestinal: Negative for abdominal pain, diarrhea, nausea and vomiting.  Genitourinary: Negative for dysuria and hematuria.  Musculoskeletal: Negative for back pain and neck pain.  Skin: Negative for color change and wound.  Neurological:  Negative for weakness and numbness.     Physical Exam Updated Vital Signs BP (!) 146/87 (BP Location: Right Arm)   Pulse 84   Temp 97.8 F (36.6 C) (Oral)   Resp 17   Ht 6\' 3"  (1.905 m)   Wt 117.9 kg (260 lb)   SpO2 98%   BMI 32.50 kg/m   Physical Exam  Constitutional: He is oriented to person, place, and time. He appears well-developed and well-nourished.  Sleeping comfortably on initial evaluation. Easily aroused with verbal stimuli. Responds to questions appropriately.  HENT:  Head: Normocephalic and atraumatic.  Mouth/Throat: Oropharynx is clear and moist and mucous membranes are normal.  Eyes: Pupils are equal, round, and reactive to light. Conjunctivae, EOM and lids are normal.  Neck: Full passive range of motion without pain.  Cardiovascular: Normal rate, regular rhythm, normal heart sounds and normal pulses.  Exam reveals no gallop and no friction rub.   No murmur heard. Pulses:      Dorsalis pedis pulses are 2+ on the right side, and 2+ on the left side.  Pulmonary/Chest: Effort normal and breath sounds normal. He has no rhonchi. He has no rales.  No evidence of respiratory distress. Able to speak in full sentences without difficulty.  Abdominal: Soft. Normal appearance. There is no tenderness. There is no rigidity and no guarding.  Musculoskeletal: Normal range of motion.  3+ pitting edema to bilateral feet that extends to the tibial regions bilaterally. No overlying warmth or erythema. Bilateral lower extremities are symmetric in appearance.  Neurological: He is alert and oriented to person, place, and time. GCS eye subscore is 4. GCS verbal subscore is 5. GCS motor subscore is 6.  Cranial nerves III-XII intact Follows commands, Moves all extremities  5/5 strength to BUE and BLE  Sensation intact throughout all major nerve distributions Normal finger to nose. No pronator drift. No gait abnormalities  No slurred speech. No facial droop.  Answers questions  appropriately. Easily aroused with verbal stimuli.  Skin: Skin is warm and dry. Capillary refill takes less than 2 seconds.  Bilateral lower extremities are without any open wounds, sores or ulcers. No overlying warmth or erythema noted to bilateral lower extremities.  Psychiatric: He has a normal mood and affect. His speech is normal.  Nursing note and vitals reviewed.    ED Treatments / Results  Labs (all labs ordered are listed, but only abnormal results are displayed) Labs Reviewed  CBC WITH DIFFERENTIAL/PLATELET - Abnormal; Notable for the following:       Result Value   Hemoglobin 12.2 (*)    HCT 36.8 (*)    All other components within normal limits  COMPREHENSIVE METABOLIC PANEL - Abnormal; Notable for the following:    Calcium 8.6 (*)    Albumin 2.9 (*)    All other components within normal limits  BRAIN NATRIURETIC PEPTIDE    EKG  EKG Interpretation None       Radiology No results found.  Procedures Procedures (including critical care time)  Medications Ordered in ED Medications - No data to display   Initial Impression /  Assessment and Plan / ED Course  I have reviewed the triage vital signs and the nursing notes.  Pertinent labs & imaging results that were available during my care of the patient were reviewed by me and considered in my medical decision making (see chart for details).     46 year old male who presents with bilateral lower extremity edema that is ongoing for the last 3 months. No new changes or symptoms. No fever, chills, chest pain, difficulty breathing. Patient is afebrile, non-toxic appearing, sitting comfortably on examination table. Vital signs reviewed and stable. Given duration of symptoms and bilateral and symmetric appearance, do not suspect DVT at this time given symmetric appearance of BLE and duration of symptoms. History/physical exam are not concerning for cellulitis. Consider chronic venous stasis versus chronic edema versus  CHF. Will obtain basic labs including CBC, BMP, BNP.  Labs reviewed. CMP unremarkable. CBC without elevation in white blood cell count. BNP is unremarkable. Discussed patient with Dr. Adela Lank. Discussed results with patient. He's been able to ambulated to the bathroom without any difficulty. I personally observed the patient in the hallway and he was able ambulated without any difficulty. Discussed results with patient. Patient states that he is planning to go to a shelter tonight. Will plan to provide resource guide for shelter. Vital signs reviewed and stable. Provided patient with a list of clinic resources to use if he does not have a PCP. Instructed to call them today to arrange follow-up in the next 24-48 hours. Strict return precautions discussed. Patient expresses understanding and agreement to plan.    Final Clinical Impressions(s) / ED Diagnoses   Final diagnoses:  Bilateral lower extremity edema    New Prescriptions Discharge Medication List as of 03/15/2017 12:06 AM       Maxwell Caul, PA-C 03/15/17 0302    Melene Plan, DO 03/15/17 1501

## 2017-03-14 NOTE — ED Triage Notes (Signed)
Pt comes from street via EMS with complaints of bilateral leg swelling.  Pt has been seen for same multiple times before, last being 2 days ago.  Non compliant with medications. Ambulatory on arrival. BP 150/90, HR 82 in route.

## 2017-03-15 NOTE — Discharge Instructions (Signed)
Take your medications as prescribed.   I provided with referrals for shelters that he can easily go to.  Follow-up to her primary care doctor next 24-48 hours for further evaluation. If he do not have a primary care doctor, you can follow-up with the referred wellness clinic.  Return the emergency Department for any worsening leg pain or swelling, redness of the leg, chest pain, difficulty breathing, no vomiting or any other worsening or concerning symptoms.

## 2017-03-16 NOTE — Congregational Nurse Program (Signed)
Congregational Nurse Program Note  Date of Encounter: 02/10/2017  Past Medical History: Past Medical History:  Diagnosis Date  . Bipolar depression (HCC)   . Hypertension   . Pneumonia    had aspiration pneumonia 5/14-on vent  . Schizophrenia (HCC)     Encounter Details: Requests BP check. Client not able to answer questions as he continues to fall asleep in chair. Previously stated that he did not have a PCP but there is one listed in EPIC. CN unable to determine if client is taking any of his medications.

## 2017-03-17 ENCOUNTER — Emergency Department (HOSPITAL_BASED_OUTPATIENT_CLINIC_OR_DEPARTMENT_OTHER)
Admit: 2017-03-17 | Discharge: 2017-03-17 | Disposition: A | Discharge status: Home / Self Care | Payer: Medicare Other | Attending: Emergency Medicine | Admitting: Emergency Medicine

## 2017-03-17 ENCOUNTER — Emergency Department (HOSPITAL_COMMUNITY)
Admission: EM | Admit: 2017-03-17 | Discharge: 2017-03-17 | Disposition: A | Discharge status: Home / Self Care | Payer: Medicare Other | Attending: Emergency Medicine | Admitting: Emergency Medicine

## 2017-03-17 ENCOUNTER — Encounter (HOSPITAL_COMMUNITY): Payer: Self-pay

## 2017-03-17 DIAGNOSIS — M7989 Other specified soft tissue disorders: Secondary | ICD-10-CM | POA: Diagnosis not present

## 2017-03-17 DIAGNOSIS — R600 Localized edema: Secondary | ICD-10-CM | POA: Insufficient documentation

## 2017-03-17 DIAGNOSIS — F17210 Nicotine dependence, cigarettes, uncomplicated: Secondary | ICD-10-CM | POA: Diagnosis not present

## 2017-03-17 DIAGNOSIS — M79609 Pain in unspecified limb: Secondary | ICD-10-CM

## 2017-03-17 DIAGNOSIS — I10 Essential (primary) hypertension: Secondary | ICD-10-CM | POA: Insufficient documentation

## 2017-03-17 LAB — COMPREHENSIVE METABOLIC PANEL
ALT: 22 U/L (ref 17–63)
AST: 27 U/L (ref 15–41)
Albumin: 3.8 g/dL (ref 3.5–5.0)
Alkaline Phosphatase: 73 U/L (ref 38–126)
Anion gap: 7 (ref 5–15)
BUN: 12 mg/dL (ref 6–20)
CHLORIDE: 102 mmol/L (ref 101–111)
CO2: 28 mmol/L (ref 22–32)
CREATININE: 0.85 mg/dL (ref 0.61–1.24)
Calcium: 9.3 mg/dL (ref 8.9–10.3)
GFR calc Af Amer: >60 mL/min (ref >60)
GLUCOSE: 81 mg/dL (ref 65–99)
Potassium: 3.9 mmol/L (ref 3.5–5.1)
Sodium: 137 mmol/L (ref 135–145)
Total Bilirubin: 0.5 mg/dL (ref 0.3–1.2)
Total Protein: 8.3 g/dL — ABNORMAL HIGH (ref 6.5–8.1)

## 2017-03-17 LAB — CBC WITH DIFFERENTIAL/PLATELET
BASOS ABS: 0.1 10*3/uL (ref 0.0–0.1)
Basophils Relative: 1 %
EOS PCT: 2 %
Eosinophils Absolute: 0.2 10*3/uL (ref 0.0–0.7)
HCT: 43.3 % (ref 39.0–52.0)
Hemoglobin: 14 g/dL (ref 13.0–17.0)
LYMPHS PCT: 17 %
Lymphs Abs: 2.2 10*3/uL (ref 0.7–4.0)
MCH: 27.7 pg (ref 26.0–34.0)
MCHC: 32.3 g/dL (ref 30.0–36.0)
MCV: 85.6 fL (ref 78.0–100.0)
Monocytes Absolute: 1.2 10*3/uL — ABNORMAL HIGH (ref 0.1–1.0)
Monocytes Relative: 9 %
NEUTROS ABS: 9.6 10*3/uL — AB (ref 1.7–7.7)
Neutrophils Relative %: 71 %
PLATELETS: 295 10*3/uL (ref 150–400)
RBC: 5.06 MIL/uL (ref 4.22–5.81)
RDW: 15.1 % (ref 11.5–15.5)
WBC: 13.2 10*3/uL — AB (ref 4.0–10.5)

## 2017-03-17 MED ORDER — DOXYCYCLINE HYCLATE 100 MG PO CAPS: 100.0000 mg | ORAL_CAPSULE | Freq: Two times a day (BID) | ORAL | 0 refills | Status: DC

## 2017-03-17 MED ORDER — AMMONIA AROMATIC IN INHA
RESPIRATORY_TRACT | Status: AC
  Filled 2017-03-17: qty 10

## 2017-03-17 MED ORDER — FUROSEMIDE 20 MG PO TABS: 20.0000 mg | ORAL_TABLET | Freq: Every day | ORAL | 0 refills | Status: DC

## 2017-03-17 NOTE — Discharge Instructions (Signed)
There are no blood clots. Please take lasix to get off fluid. Take antibiotics as there may be a component of mild infection Keep legs elevated on a few pillows while sleeping or sitting Please use compression stockings while you are walking

## 2017-03-17 NOTE — ED Triage Notes (Signed)
Patient ws brought in by ACT counselor. Patient has increased right  leg swelling. Patient is homeless, but ws seeing an ACT counselor today.

## 2017-03-17 NOTE — Progress Notes (Signed)
*  PRELIMINARY RESULTS* Vascular Ultrasound Right lower extremity venous duplex has been completed.  Preliminary findings: No evidence of deep vein thrombosis in the visualized veins of the right lower extremity.  Difficult exam due to patient body habitus and penetration. Negative for baker's cyst on the right.  Preliminary results given to provider @ 12:00   Chauncey FischerCharlotte C Malosi Hemstreet 03/17/2017, 12:18 PM

## 2017-03-17 NOTE — ED Provider Notes (Signed)
[facility]ER[facility]ENCY DEPT Provider Note   CSN: [id number] Arrival date & time: 03/17/17  1035     History   Chief Complaint Chief Complaint  Patient presents with  . Leg Swelling    HPI [name] [name] is a 46 y.o. male.  The history is provided by the patient.  Illness  This is a new problem. The current episode started more than 1 week ago. The problem occurs constantly. The problem has been gradually worsening. Pertinent negatives include no chest pain, no abdominal pain and no shortness of breath. Nothing aggravates the symptoms. Nothing relieves the symptoms. He has tried nothing for the symptoms.   46 year old male who presents with bilateral lower extremity swelling. This is on a chronic issue that has been ongoing for several months. He has history of hypertension, schizophrenia and bipolar depression. He has been on Lasix in the past which she reports good effect with. He was brought in by a team member for evaluation of this. States that he has been walking and on his feet a lot more recently Denies any fevers, nausea or vomiting, chest pain or difficulty breathing. No previous history of blood clots or anticoagulation usage.   Past Medical History:  Diagnosis Date  . Bipolar depression (HCC)   . Hypertension   . Pneumonia    had aspiration pneumonia 5/14-on vent  . Schizophrenia [facility])     Patient Active Problem List   Diagnosis Date Noted  . Somnolence 01/09/2017  . Paranoid schizophrenia (HCC) 04/20/2013  . Acute respiratory failure with hypercapnia (HCC) 11/17/2012  . Encephalopathy acute 11/17/2012    Past Surgical History:  Procedure Laterality Date  . arm surgery     hurt his arm 25 hr ago  . OPEN REDUCTION INTERNAL FIXATION (ORIF) PROXIMAL PHALANX Right 04/02/2013   Procedure: OPEN REDUCTION INTERNAL FIXATION (ORIF) RIGHT SMALL FINGER;  Surgeon: Tami Ribas, MD;  Location: [facility];  Service: Orthopedics;  Laterality: Right;       Home  Medications    Prior to Admission medications   Medication Sig Start Date End Date Taking? Authorizing Provider  albuterol (PROVENTIL HFA;VENTOLIN HFA) 108 (90 Base) MCG/ACT inhaler Inhale 1-2 puffs into the lungs every 6 (six) hours as needed for wheezing or shortness of breath. Patient not taking: Reported on 03/04/2017 12/22/16   Arby Barrette, MD  amLODipine (NORVASC) 10 MG tablet Take 1 tablet (10 mg total) by mouth daily. Patient not taking: Reported on 03/04/2017 01/21/17   Lorre Nick, MD  carbamazepine (TEGRETOL) 200 MG tablet Take 1 tablet (200 mg total) by mouth 2 (two) times daily. Patient not taking: Reported on 11/25/2016 09/02/16   Charlynne Pander, MD  doxycycline (VIBRAMYCIN) 100 MG capsule Take 1 capsule (100 mg total) by mouth 2 (two) times daily. 03/17/17   Lavera Guise, MD  furosemide (LASIX) 20 MG tablet Take 1 tablet (20 mg total) by mouth daily. Patient not taking: Reported on 03/04/2017 01/21/17   Lorre Nick, MD  furosemide (LASIX) 20 MG tablet Take 1 tablet (20 mg total) by mouth daily. 03/17/17   Lavera Guise, MD  pantoprazole (PROTONIX) 20 MG tablet Take 1 tablet (20 mg total) by mouth daily. Patient not taking: Reported on 03/04/2017 12/22/16   Arby Barrette, MD  potassium chloride (K-DUR) 10 MEQ tablet Take 1 tablet (10 mEq total) by mouth daily. Patient not taking: Reported on 03/04/2017 01/21/17   Lorre Nick, MD  QUEtiapine (SEROQUEL) 50 MG tablet Take 1 tablet (50  mg total) by mouth 3 (three) times daily. Patient not taking: Reported on 03/04/2017 12/22/16   Arby Barrette, MD    Family History Family History  Problem Relation Age of Onset  . Family history unknown: Yes    Social History Social History  Substance Use Topics  . Smoking status: Current Every Day Smoker    Types: Cigarettes  . Smokeless tobacco: Never Used  . Alcohol use Yes     Comment: within the last week     Allergies   Tomato   Review of Systems Review of Systems    Constitutional: Negative for fever.  Respiratory: Negative for shortness of breath.   Cardiovascular: Negative for chest pain.  Gastrointestinal: Negative for abdominal pain.  All other systems reviewed and are negative.    Physical Exam Updated Vital Signs BP (!) 136/96 (BP Location: Left Arm)   Pulse 86   Temp 98.6 F (37 C) (Oral)   Resp 18   Ht  (1.905 m)   Wt 117.9 kg (260 lb)   SpO2 99%   BMI 32.50 kg/m   Physical Exam Physical Exam  Nursing note and vitals reviewed. Constitutional: Well developed, well nourished, non-toxic, and in no acute distress Head: Normocephalic and atraumatic.  Mouth/Throat: Oropharynx is clear and moist.  Neck: Normal range of motion. Neck supple.  Cardiovascular: Normal rate and regular rhythm.   Pulmonary/Chest: Effort normal and breath sounds normal.  Abdominal: Soft. There is no tenderness. There is no rebound and no guarding.  Musculoskeletal: Bilateral pitting edema, right greater than left from ankles to below the knee. There is brawny discoloration with slight warmth over RLE, without drainage.  Neurological: Alert, no facial droop, fluent speech, moves all extremities symmetrically Skin: Skin is warm and dry.  Psychiatric: Cooperative   ED Treatments / Results  Labs (all labs ordered are listed, but only abnormal results are displayed) Labs Reviewed  CBC WITH DIFFERENTIAL/PLATELET - Abnormal; Notable for the following:       Result Value   WBC 13.2 (*)    Neutro Abs 9.6 (*)    Monocytes Absolute 1.2 (*)    All other components within normal limits  COMPREHENSIVE METABOLIC PANEL - Abnormal; Notable for the following:    Total Protein 8.3 (*)    All other components within normal limits    EKG  EKG Interpretation None       Radiology No results found.  Procedures Procedures (including critical care time)  Medications Ordered in ED Medications - No data to display   Initial Impression / Assessment and  Plan / ED Course  I have reviewed the triage vital signs and the nursing notes.  Pertinent labs & imaging results that were available during my care of the patient were reviewed by me and considered in my medical decision making (see chart for details).     46 year old male who presents with bilateral lower extremity edema. Records are reviewed. This is a chronic issue that has been standing several months. He did briefly respond to Lasix, which we will prescribe him here today for short course. Afebrile, hemodynamically stable. Ultrasound without evidence of DVT. Normal renal and liver function. There is slight ulceration of the right lower extremity, with more induration and slightly more erythema than the left lower extremity. May be chronic venous dermatitis versus early cellulitis. We'll treat with a course of doxycycline. Stable for discharge.   Final Clinical Impressions(s) / ED Diagnoses   Final diagnoses:  Bilateral lower  extremity edema    New Prescriptions New Prescriptions   DOXYCYCLINE (VIBRAMYCIN) 100 MG CAPSULE    Take 1 capsule (100 mg total) by mouth 2 (two) times daily.   FUROSEMIDE (LASIX) 20 MG TABLET    Take 1 tablet (20 mg total) by mouth daily.     Lavera GuiseLiu, Glendia Olshefski Duo, MD 03/17/17 [phone]

## 2017-03-17 NOTE — ED Notes (Signed)
Vascular at bedside

## 2017-03-18 ENCOUNTER — Encounter (HOSPITAL_COMMUNITY): Payer: Self-pay | Admitting: Emergency Medicine

## 2017-03-18 ENCOUNTER — Emergency Department (HOSPITAL_COMMUNITY)
Admission: EM | Admit: 2017-03-18 | Discharge: 2017-03-19 | Disposition: A | Discharge status: Home / Self Care | Payer: Medicare Other | Attending: Emergency Medicine | Admitting: Emergency Medicine

## 2017-03-18 DIAGNOSIS — I10 Essential (primary) hypertension: Secondary | ICD-10-CM | POA: Insufficient documentation

## 2017-03-18 DIAGNOSIS — F17210 Nicotine dependence, cigarettes, uncomplicated: Secondary | ICD-10-CM | POA: Insufficient documentation

## 2017-03-18 DIAGNOSIS — Z79899 Other long term (current) drug therapy: Secondary | ICD-10-CM | POA: Diagnosis not present

## 2017-03-18 DIAGNOSIS — R601 Generalized edema: Secondary | ICD-10-CM | POA: Diagnosis present

## 2017-03-18 DIAGNOSIS — R609 Edema, unspecified: Secondary | ICD-10-CM

## 2017-03-18 MED ORDER — FUROSEMIDE 40 MG PO TABS
40.0000 mg | ORAL_TABLET | Freq: Once | ORAL | Status: AC
  Administered 2017-03-19: 40 mg via ORAL
  Filled 2017-03-18: qty 1

## 2017-03-18 NOTE — ED Triage Notes (Addendum)
Pt BIB EMS for leg swelling. Swelling has been going on x3 weeks. Patient seen here yesterday for same. Patient is asleep and snoring on arrival, PERRLA intact, easily arousable.

## 2017-03-19 ENCOUNTER — Encounter (HOSPITAL_COMMUNITY): Payer: Self-pay | Admitting: Emergency Medicine

## 2017-03-19 MED ORDER — AMMONIA AROMATIC IN INHA
RESPIRATORY_TRACT | Status: AC
  Administered 2017-03-19
  Filled 2017-03-19: qty 10

## 2017-03-19 NOTE — ED Provider Notes (Signed)
[facility]-EMERGENCY DEPT Provider Note   CSN: [id number] Arrival date & time: 03/18/17  2224     History   Chief Complaint Chief Complaint  Patient presents with  . Leg Swelling    x3 Weeks    HPI [name] [name] is a 46 y.o. male.  Level V caveat sleeping. The patient checked in with a chief complaint of bilateral leg swelling and pain. Looking back at prior notes this is his sixth or seventh visit in the ED for this. I'm unable to wake up the patient to discuss this with him.He also told nursing staff that he had been drinking heavily last night.   The history is provided by the patient.  Leg Pain   This is a chronic problem. The current episode started more than 1 week ago. The problem occurs constantly. The problem has not changed since onset.   Past Medical History:  Diagnosis Date  . Bipolar depression (HCC)   . Hypertension   . Pneumonia    had aspiration pneumonia 5/14-on vent  . Schizophrenia [facility])     Patient Active Problem List   Diagnosis Date Noted  . Somnolence 01/09/2017  . Paranoid schizophrenia (HCC) 04/20/2013  . Acute respiratory failure with hypercapnia (HCC) 11/17/2012  . Encephalopathy acute 11/17/2012    Past Surgical History:  Procedure Laterality Date  . arm surgery     hurt his arm 25 hr ago  . OPEN REDUCTION INTERNAL FIXATION (ORIF) PROXIMAL PHALANX Right 04/02/2013   Procedure: OPEN REDUCTION INTERNAL FIXATION (ORIF) RIGHT SMALL FINGER;  Surgeon: Tami Ribas, MD;  Location: [facility];  Service: Orthopedics;  Laterality: Right;       Home Medications    Prior to Admission medications   Medication Sig Start Date End Date Taking? Authorizing Provider  albuterol (PROVENTIL HFA;VENTOLIN HFA) 108 (90 Base) MCG/ACT inhaler Inhale 1-2 puffs into the lungs every 6 (six) hours as needed for wheezing or shortness of breath. Patient not taking: Reported on 03/04/2017 12/22/16   Arby Barrette, MD  amLODipine (NORVASC) 10 MG tablet  Take 1 tablet (10 mg total) by mouth daily. Patient not taking: Reported on 03/04/2017 01/21/17   Lorre Nick, MD  carbamazepine (TEGRETOL) 200 MG tablet Take 1 tablet (200 mg total) by mouth 2 (two) times daily. Patient not taking: Reported on 11/25/2016 09/02/16   Charlynne Pander, MD  doxycycline (VIBRAMYCIN) 100 MG capsule Take 1 capsule (100 mg total) by mouth 2 (two) times daily. 03/17/17   Lavera Guise, MD  furosemide (LASIX) 20 MG tablet Take 1 tablet (20 mg total) by mouth daily. Patient not taking: Reported on 03/04/2017 01/21/17   Lorre Nick, MD  furosemide (LASIX) 20 MG tablet Take 1 tablet (20 mg total) by mouth daily. 03/17/17   Lavera Guise, MD  pantoprazole (PROTONIX) 20 MG tablet Take 1 tablet (20 mg total) by mouth daily. Patient not taking: Reported on 03/04/2017 12/22/16   Arby Barrette, MD  potassium chloride (K-DUR) 10 MEQ tablet Take 1 tablet (10 mEq total) by mouth daily. Patient not taking: Reported on 03/04/2017 01/21/17   Lorre Nick, MD  QUEtiapine (SEROQUEL) 50 MG tablet Take 1 tablet (50 mg total) by mouth 3 (three) times daily. Patient not taking: Reported on 03/04/2017 12/22/16   Arby Barrette, MD    Family History Family History  Problem Relation Age of Onset  . Family history unknown: Yes    Social History Social History  Substance Use Topics  . Smoking  status: Current Every Day Smoker    Types: Cigarettes  . Smokeless tobacco: Never Used  . Alcohol use Yes     Comment: within the last week     Allergies   Tomato   Review of Systems Review of Systems  Unable to perform ROS: Mental status change  Constitutional: Negative for chills and fever.  HENT: Negative for congestion and facial swelling.   Eyes: Negative for discharge and visual disturbance.  Respiratory: Negative for shortness of breath.   Cardiovascular: Negative for chest pain and palpitations.  Gastrointestinal: Negative for abdominal pain, diarrhea and vomiting.    Musculoskeletal: Negative for arthralgias and myalgias.  Skin: Negative for color change and rash.  Neurological: Negative for tremors, syncope and headaches.  Psychiatric/Behavioral: Negative for confusion and dysphoric mood.     Physical Exam Updated Vital Signs BP 109/90 (BP Location: Left Arm)   Pulse 80   Temp 98 F (36.7 C) (Oral)   Resp 20   SpO2 92%   Physical Exam  Constitutional: He appears well-developed and well-nourished.  HENT:  Head: Normocephalic and atraumatic.  Eyes: Pupils are equal, round, and reactive to light. EOM are normal.  Neck: Normal range of motion. Neck supple. No JVD present.  Cardiovascular: Normal rate and regular rhythm.  Exam reveals no gallop and no friction rub.   No murmur heard. Pulmonary/Chest: No respiratory distress. He has no wheezes.  Abdominal: He exhibits no distension and no mass. There is no tenderness. There is no rebound and no guarding.  Musculoskeletal: Normal range of motion. He exhibits edema.  Skin: No rash noted. No pallor.  Psychiatric: He has a normal mood and affect. His behavior is normal.  Nursing note and vitals reviewed.    ED Treatments / Results  Labs (all labs ordered are listed, but only abnormal results are displayed) Labs Reviewed - No data to display  EKG  EKG Interpretation None       Radiology No results found.  Procedures Procedures  (including critical care time)  Medications Ordered in ED Medications  furosemide (LASIX) tablet 40 mg (40 mg Oral Given 03/19/17 0016)  ammonia inhalant (  Given 03/19/17 0015)     Initial Impression / Assessment and Plan / ED Course  I have reviewed the triage vital signs and the nursing notes.  Pertinent labs & imaging results that were available during my care of the patient were reviewed by me and considered in my medical decision making (see chart for details).     46 yo M with a chief complaint of bilateral leg swelling. The patient has multiple  visits to the ED for the same.Multiple workups even from a couple days ago negative.  Feel no need to repeat labs.   Ambulated about the department.  Eloped prior to receiving discharge instructions.   Final Clinical Impressions(s) / ED Diagnoses   Final diagnoses:  Peripheral edema    New Prescriptions New Prescriptions   No medications on file        Melene PlanFloyd, Yadier Bramhall, DO 03/19/17 0153

## 2017-03-19 NOTE — ED Notes (Addendum)
Patient proceeded to urinate in the bed, floor and in the sink and blow snot onto the floor. Patient got out of bed and proceeded to leave the ED before papers could be given

## 2017-03-26 ENCOUNTER — Encounter (HOSPITAL_COMMUNITY): Payer: Self-pay | Admitting: Emergency Medicine

## 2017-03-26 ENCOUNTER — Emergency Department (HOSPITAL_COMMUNITY)
Admission: EM | Admit: 2017-03-26 | Discharge: 2017-03-26 | Discharge status: Against Medical Advice | Payer: Medicare Other | Attending: Emergency Medicine | Admitting: Emergency Medicine

## 2017-03-26 DIAGNOSIS — Z79899 Other long term (current) drug therapy: Secondary | ICD-10-CM | POA: Insufficient documentation

## 2017-03-26 DIAGNOSIS — R609 Edema, unspecified: Secondary | ICD-10-CM | POA: Diagnosis present

## 2017-03-26 DIAGNOSIS — I10 Essential (primary) hypertension: Secondary | ICD-10-CM | POA: Diagnosis not present

## 2017-03-26 DIAGNOSIS — F17210 Nicotine dependence, cigarettes, uncomplicated: Secondary | ICD-10-CM | POA: Insufficient documentation

## 2017-03-26 NOTE — ED Provider Notes (Signed)
[facility] DEPT Provider Note   CSN: [id number] Arrival date & time: 03/26/17  0208     History   Chief Complaint Chief Complaint  Patient presents with  . Foot Pain  . Alcohol Intoxication    HPI Jacques Fife is a 46 y.o. male.  The history is provided by the patient and medical records. No language interpreter was used.  Foot Pain  Pertinent negatives include no chest pain, no abdominal pain and no shortness of breath.  Alcohol Intoxication  Pertinent negatives include no chest pain, no abdominal pain and no shortness of breath.   Alexius Hangartner is a 46 y.o. male  who presents to the Emergency Department complaining of persistent lower extremity swelling and foot pain x 3 weeks. Patient has been seen in ED multiple times for same and started on Lasix bid. Patient does state that he has been taking this medication. He has had negative DVT studies this month as well. No chest pain, shortness of breath or back pain. No fever or chills. Patient also endorses ETOH use tonight. He will awaken and answer questions but then go back to sleep.   Past Medical History:  Diagnosis Date  . Bipolar depression (HCC)   . Hypertension   . Pneumonia    had aspiration pneumonia 5/14-on vent  . Schizophrenia [facility])     Patient Active Problem List   Diagnosis Date Noted  . Somnolence 01/09/2017  . Paranoid schizophrenia (HCC) 04/20/2013  . Acute respiratory failure with hypercapnia (HCC) 11/17/2012  . Encephalopathy acute 11/17/2012    Past Surgical History:  Procedure Laterality Date  . arm surgery     hurt his arm 25 hr ago  . OPEN REDUCTION INTERNAL FIXATION (ORIF) PROXIMAL PHALANX Right 04/02/2013   Procedure: OPEN REDUCTION INTERNAL FIXATION (ORIF) RIGHT SMALL FINGER;  Surgeon: Tami Ribas, MD;  Location: [facility];  Service: Orthopedics;  Laterality: Right;       Home Medications    Prior to Admission medications   Medication Sig Start Date End Date  Taking? Authorizing Provider  albuterol (PROVENTIL HFA;VENTOLIN HFA) 108 (90 Base) MCG/ACT inhaler Inhale 1-2 puffs into the lungs every 6 (six) hours as needed for wheezing or shortness of breath. Patient not taking: Reported on 03/04/2017 12/22/16   Arby Barrette, MD  amLODipine (NORVASC) 10 MG tablet Take 1 tablet (10 mg total) by mouth daily. Patient not taking: Reported on 03/04/2017 01/21/17   Lorre Nick, MD  carbamazepine (TEGRETOL) 200 MG tablet Take 1 tablet (200 mg total) by mouth 2 (two) times daily. Patient not taking: Reported on 11/25/2016 09/02/16   Charlynne Pander, MD  doxycycline (VIBRAMYCIN) 100 MG capsule Take 1 capsule (100 mg total) by mouth 2 (two) times daily. 03/17/17   Lavera Guise, MD  furosemide (LASIX) 20 MG tablet Take 1 tablet (20 mg total) by mouth daily. Patient not taking: Reported on 03/04/2017 01/21/17   Lorre Nick, MD  furosemide (LASIX) 20 MG tablet Take 1 tablet (20 mg total) by mouth daily. 03/17/17   Lavera Guise, MD  pantoprazole (PROTONIX) 20 MG tablet Take 1 tablet (20 mg total) by mouth daily. Patient not taking: Reported on 03/04/2017 12/22/16   Arby Barrette, MD  potassium chloride (K-DUR) 10 MEQ tablet Take 1 tablet (10 mEq total) by mouth daily. Patient not taking: Reported on 03/04/2017 01/21/17   Lorre Nick, MD  QUEtiapine (SEROQUEL) 50 MG tablet Take 1 tablet (50 mg total) by mouth 3 (three) times  daily. Patient not taking: Reported on 03/04/2017 12/22/16   Arby Barrette, MD    Family History Family History  Problem Relation Age of Onset  . Family history unknown: Yes    Social History Social History  Substance Use Topics  . Smoking status: Current Every Day Smoker    Types: Cigarettes  . Smokeless tobacco: Never Used  . Alcohol use Yes     Comment: within the last week     Allergies   Tomato   Review of Systems Review of Systems  Constitutional: Negative for chills and fever.  Respiratory: Negative for shortness of  breath.   Cardiovascular: Positive for leg swelling. Negative for chest pain and palpitations.  Gastrointestinal: Negative for abdominal pain.  Musculoskeletal: Positive for arthralgias and myalgias.  Neurological: Negative for weakness and numbness.     Physical Exam Updated Vital Signs BP (!) 144/88 (BP Location: Left Arm)   Pulse 81   Temp 98 F (36.7 C) (Oral)   Resp 18   Ht  (1.905 m)   Wt 117.9 kg (260 lb)   SpO2 98%   BMI 32.50 kg/m   Physical Exam  Constitutional: He is oriented to person, place, and time. He appears well-developed and well-nourished. No distress.  HENT:  Head: Normocephalic and atraumatic.  Cardiovascular: Normal rate, regular rhythm and normal heart sounds.   No murmur heard. Pulmonary/Chest: Effort normal and breath sounds normal. No respiratory distress.  Abdominal: Soft.  Musculoskeletal: He exhibits edema (bilateral).  Neurological: He is alert and oriented to person, place, and time.  Skin: Skin is warm and dry.  Nursing note and vitals reviewed.    ED Treatments / Results  Labs (all labs ordered are listed, but only abnormal results are displayed) Labs Reviewed - No data to display  EKG  EKG Interpretation None       Radiology No results found.  Procedures Procedures (including critical care time)  Medications Ordered in ED Medications - No data to display   Initial Impression / Assessment and Plan / ED Course  I have reviewed the triage vital signs and the nursing notes.  Pertinent labs & imaging results that were available during my care of the patient were reviewed by me and considered in my medical decision making (see chart for details).    Ziyad Dyar is a 46 y.o. male who presents to ED for bilateral lower extremity swelling. Seen in ED multiple times for same. He had negative DVT study on 9/07. He was started on Lasix which he states that he has been taking. I spoke with patient about the importance of PCP  follow up for ongoing symptoms, however he appears quite intoxicated clinically and endorses ETOH use. Plan for patient to sober up and discuss plan of care again once patient is more alert and appropriate to discuss home care / return precautions / follow up.   Nursing staff notified me that patient eloped from department prior to discharge.   Final Clinical Impressions(s) / ED Diagnoses   Final diagnoses:  Peripheral edema    New Prescriptions Discharge Medication List as of 03/26/2017  8:51 AM       Tricia Oaxaca, Chase Picket, PA-C 03/26/17 [id number]    Benjiman Core, MD 03/26/17 [id number]

## 2017-03-26 NOTE — ED Notes (Signed)
Bed: WTR8 Expected date:  Expected time:  Means of arrival:  Comments: 

## 2017-03-26 NOTE — ED Notes (Signed)
Pa tried to access and patient will not wake up.

## 2017-03-26 NOTE — ED Triage Notes (Signed)
EMS called out to patients house due to [facility] stating patient is having foot pain. Patient is being seen by DSS and patient is supposed to be wearing but patient is not wearing socks. Patient also smells of alcohol.

## 2017-03-26 NOTE — ED Notes (Signed)
Patient is complaining of his feet hurting. Patient smells of alcohol. Patient wanted socks and was given socks. Patient is currently snoring loudly.

## 2017-03-26 NOTE — ED Notes (Signed)
Bed: WTR7 Expected date:  Expected time:  Means of arrival:  Comments: 

## 2017-03-26 NOTE — ED Notes (Addendum)
Attempted to round on patient. Patient and belongings no longer in room.

## 2017-03-30 DIAGNOSIS — F10920 Alcohol use, unspecified with intoxication, uncomplicated: Secondary | ICD-10-CM | POA: Insufficient documentation

## 2017-03-30 DIAGNOSIS — I10 Essential (primary) hypertension: Secondary | ICD-10-CM | POA: Insufficient documentation

## 2017-03-30 DIAGNOSIS — R601 Generalized edema: Secondary | ICD-10-CM | POA: Insufficient documentation

## 2017-03-30 DIAGNOSIS — Z79899 Other long term (current) drug therapy: Secondary | ICD-10-CM | POA: Insufficient documentation

## 2017-03-30 DIAGNOSIS — F17210 Nicotine dependence, cigarettes, uncomplicated: Secondary | ICD-10-CM | POA: Diagnosis not present

## 2017-03-31 ENCOUNTER — Emergency Department (HOSPITAL_COMMUNITY)
Admission: EM | Admit: 2017-03-31 | Discharge: 2017-03-31 | Disposition: A | Discharge status: Home / Self Care | Payer: Medicare Other | Attending: Emergency Medicine | Admitting: Emergency Medicine

## 2017-03-31 ENCOUNTER — Encounter (HOSPITAL_COMMUNITY): Payer: Self-pay

## 2017-03-31 DIAGNOSIS — F10920 Alcohol use, unspecified with intoxication, uncomplicated: Secondary | ICD-10-CM

## 2017-03-31 DIAGNOSIS — R609 Edema, unspecified: Secondary | ICD-10-CM

## 2017-03-31 NOTE — Discharge Instructions (Signed)
It is very important that you stop drinking alcohol. If you stop drinking, your leg swelling will improve. Take Lasix as prescribed. Follow-up with your primary care doctor for further management of your leg swelling. Return to the emergency room if you develop chest pain, shortness of breath, fever, chills, or any new or worsening symptoms.

## 2017-03-31 NOTE — ED Notes (Signed)
Pt in the restroom and noted to be cursing and screaming.  Pt screaming that he wants a shirt and wants to leave.  Pt using several expletives and repeatedly calling [employer] a "b*tch."   Security called to restroom.    Attempted to call ACT team.  ACT staff reports their provider is unavailable, but they would give him the note.  Notes from previous visits sts the ACT team will not transport him.

## 2017-03-31 NOTE — ED Notes (Signed)
Pt called for triage, no response from lobby 

## 2017-03-31 NOTE — ED Notes (Signed)
Pt able to ambulate to restroom w/ some assistance.  Sts he normally uses a wheelchair.

## 2017-03-31 NOTE — ED Notes (Signed)
Patient denies pain and is resting comfortably.  

## 2017-03-31 NOTE — ED Notes (Signed)
Patient incontinent of urine while sleeping in the lobby

## 2017-03-31 NOTE — ED Notes (Signed)
Pt noted to be sleeping and snoring loudly.

## 2017-03-31 NOTE — ED Triage Notes (Signed)
Pt in lobby sleeping not answering to triage

## 2017-03-31 NOTE — ED Triage Notes (Signed)
Patient arrived by EMS earlier for complaints of leg swelling-patient has been sleeping in the waiting room since arrival

## 2017-03-31 NOTE — ED Provider Notes (Signed)
[facility]-EMERGENCY DEPT Provider Note   CSN: [id number] Arrival date & time: 03/30/17  2322     History   Chief Complaint Chief Complaint  Patient presents with  . Leg Swelling    HPI [name] [name] is a 46 y.o. male presenting by EMS for bilateral leg swelling.  Level V caveat, as patient is drunk, and will open his eyes and immediately fall back asleep.   Per EMS, patient called to come to the hospital due to bilateral leg swelling. He has been seen multiple times in the past month for the same. Each time, he has had a negative workup. Additionally, patient has had significant alcohol intake. Alcohol can be smelled around the patient. Patient with bilateral leg swelling, which is baseline for him. Negative DVT study on the 03/17/17. Will try to obtain further history once patient is more awake.  HPI  Past Medical History:  Diagnosis Date  . Bipolar depression (HCC)   . Hypertension   . Pneumonia    had aspiration pneumonia 5/14-on vent  . Schizophrenia [facility])     Patient Active Problem List   Diagnosis Date Noted  . Somnolence 01/09/2017  . Paranoid schizophrenia (HCC) 04/20/2013  . Acute respiratory failure with hypercapnia (HCC) 11/17/2012  . Encephalopathy acute 11/17/2012    Past Surgical History:  Procedure Laterality Date  . arm surgery     hurt his arm 25 hr ago  . OPEN REDUCTION INTERNAL FIXATION (ORIF) PROXIMAL PHALANX Right 04/02/2013   Procedure: OPEN REDUCTION INTERNAL FIXATION (ORIF) RIGHT SMALL FINGER;  Surgeon: Tami Ribas, MD;  Location: [facility];  Service: Orthopedics;  Laterality: Right;       Home Medications    Prior to Admission medications   Medication Sig Start Date End Date Taking? Authorizing Provider  albuterol (PROVENTIL HFA;VENTOLIN HFA) 108 (90 Base) MCG/ACT inhaler Inhale 1-2 puffs into the lungs every 6 (six) hours as needed for wheezing or shortness of breath. Patient not taking: Reported on 03/04/2017 12/22/16    Arby Barrette, MD  amLODipine (NORVASC) 10 MG tablet Take 1 tablet (10 mg total) by mouth daily. Patient not taking: Reported on 03/04/2017 01/21/17   Lorre Nick, MD  carbamazepine (TEGRETOL) 200 MG tablet Take 1 tablet (200 mg total) by mouth 2 (two) times daily. Patient not taking: Reported on 11/25/2016 09/02/16   Charlynne Pander, MD  doxycycline (VIBRAMYCIN) 100 MG capsule Take 1 capsule (100 mg total) by mouth 2 (two) times daily. 03/17/17   Lavera Guise, MD  furosemide (LASIX) 20 MG tablet Take 1 tablet (20 mg total) by mouth daily. Patient not taking: Reported on 03/04/2017 01/21/17   Lorre Nick, MD  furosemide (LASIX) 20 MG tablet Take 1 tablet (20 mg total) by mouth daily. 03/17/17   Lavera Guise, MD  pantoprazole (PROTONIX) 20 MG tablet Take 1 tablet (20 mg total) by mouth daily. Patient not taking: Reported on 03/04/2017 12/22/16   Arby Barrette, MD  potassium chloride (K-DUR) 10 MEQ tablet Take 1 tablet (10 mEq total) by mouth daily. Patient not taking: Reported on 03/04/2017 01/21/17   Lorre Nick, MD  QUEtiapine (SEROQUEL) 50 MG tablet Take 1 tablet (50 mg total) by mouth 3 (three) times daily. Patient not taking: Reported on 03/04/2017 12/22/16   Arby Barrette, MD    Family History Family History  Problem Relation Age of Onset  . Family history unknown: Yes    Social History Social History  Substance Use Topics  . Smoking  status: Current Every Day Smoker    Types: Cigarettes  . Smokeless tobacco: Never Used  . Alcohol use Yes     Comment: within the last week     Allergies   Tomato   Review of Systems Review of Systems  Unable to perform ROS: Other   Patient is drunk and asleep.  Physical Exam Updated Vital Signs BP (!) 147/74   Pulse 89   Temp 98.3 F (36.8 C)   Resp 18   SpO2 96%   Physical Exam  Constitutional: He appears well-developed and well-nourished. No distress.  HENT:  Head: Normocephalic and atraumatic.  Eyes: EOM are normal.    Neck: Normal range of motion.  Cardiovascular: Normal rate, regular rhythm and intact distal pulses.   Pulmonary/Chest: Effort normal and breath sounds normal. No respiratory distress. He has no wheezes. He has no rales. He exhibits no tenderness.  Patient without any difficulty breathing. Is snoring.  Abdominal: Soft. Bowel sounds are normal. He exhibits no distension.  Musculoskeletal: Normal range of motion.  Bilateral lower leg edema. Pedal pulses intact bilaterally.   Neurological:  Patient smells of alcohol and is difficult to arouse. Will open his eyes, but immediately resumes snoring.  Skin: Skin is warm. No rash noted.  Psychiatric: He has a normal mood and affect.  Nursing note and vitals reviewed.    ED Treatments / Results  Labs (all labs ordered are listed, but only abnormal results are displayed) Labs Reviewed - No data to display  EKG  EKG Interpretation None       Radiology No results found.  Procedures Procedures (including critical care time)  Medications Ordered in ED Medications - No data to display   Initial Impression / Assessment and Plan / ED Course  I have reviewed the triage vital signs and the nursing notes.  Pertinent labs & imaging results that were available during my care of the patient were reviewed by me and considered in my medical decision making (see chart for details).     Patient presenting with alcohol intoxication and complaints of bilateral leg swelling. Patient asleep throughout the majority of the visit. Physical exam reassuring, as patient is easily arousable and neurovascularly intact. When asked if he is taking his medications, patient states yes, however he is carrying a bag of medications that show that he has not been taking lasix. Discussed with pt that he needs to take his medication and stop drinking alcohol. Patient denies being in any pain, difficulty breathing, chest pain, shortness of breath, nausea, vomiting, and  numbness, or tingling.  After > 9 hours, patient alert enough to ambulate. I believe patient has chronic leg swelling, which does not appear different today. Doubt DVT, as a negative DVT workup recently. Denies sxs of PE. Patient appears safe for discharge. Return precautions given. Patient states he understands and agrees to plan.  Final Clinical Impressions(s) / ED Diagnoses   Final diagnoses:  Alcoholic intoxication without complication [facility])  Peripheral edema    New Prescriptions Discharge Medication List as of 03/31/2017 10:24 AM       Alveria Apley, PA-C 04/01/17 1743    Palumbo, April, MD 04/06/17 2304

## 2017-03-31 NOTE — ED Notes (Signed)
Pt called for triage, pt sleeping in lobby.

## 2017-03-31 NOTE — ED Notes (Signed)
Patient retrieved from lobby where he has been sleeping on arrival-assisted to [place] D bed and back to sleep. Patient declines to answer triage questions and only replies briefly in garbled speech.

## 2017-04-01 ENCOUNTER — Encounter (HOSPITAL_COMMUNITY): Payer: Self-pay | Admitting: Emergency Medicine

## 2017-04-02 ENCOUNTER — Encounter (HOSPITAL_COMMUNITY): Payer: Self-pay

## 2017-04-02 ENCOUNTER — Emergency Department (HOSPITAL_COMMUNITY)
Admission: EM | Admit: 2017-04-02 | Discharge: 2017-04-02 | Disposition: A | Discharge status: Home / Self Care | Payer: Medicare Other | Attending: Emergency Medicine | Admitting: Emergency Medicine

## 2017-04-02 DIAGNOSIS — Z5321 Procedure and treatment not carried out due to patient leaving prior to being seen by health care provider: Secondary | ICD-10-CM | POA: Insufficient documentation

## 2017-04-02 DIAGNOSIS — M79669 Pain in unspecified lower leg: Secondary | ICD-10-CM | POA: Insufficient documentation

## 2017-04-02 HISTORY — DX: Gout, unspecified: M10.9

## 2017-04-02 NOTE — ED Triage Notes (Signed)
Pt comes via [facility] for bilateral leg pain from [employer] for Gout flair up.

## 2017-04-02 NOTE — ED Notes (Signed)
Patient asked for bus pass and left ED.

## 2017-04-02 NOTE — ED Notes (Signed)
Pt was sitting in the room screaming for a nurse or dr. He said he wanted to be seen now. Was very rude and obnoxious. Wanted to leave. Was escorted out by security. Was intoxicated.

## 2017-04-02 NOTE — ED Provider Notes (Signed)
  11:00 PM Patient being escorted out by [employer] and security.  I was in another room evaluating a patient when he apparently started yelling, screaming, and cursing.  I did not get to see or evaluate patient today prior to him being escorted out.   Garlon Hatchet, PA-C 04/02/17 2301    Lavera Guise, MD 04/03/17 [phone]

## 2017-04-03 ENCOUNTER — Encounter (HOSPITAL_COMMUNITY): Payer: Self-pay | Admitting: Emergency Medicine

## 2017-04-03 ENCOUNTER — Encounter: Payer: Self-pay | Admitting: Pediatric Intensive Care

## 2017-04-03 ENCOUNTER — Emergency Department (HOSPITAL_COMMUNITY)
Admission: EM | Admit: 2017-04-03 | Discharge: 2017-04-03 | Disposition: A | Discharge status: Home / Self Care | Payer: Medicare Other | Attending: Emergency Medicine | Admitting: Emergency Medicine

## 2017-04-03 DIAGNOSIS — M79672 Pain in left foot: Secondary | ICD-10-CM | POA: Insufficient documentation

## 2017-04-03 DIAGNOSIS — F17210 Nicotine dependence, cigarettes, uncomplicated: Secondary | ICD-10-CM | POA: Diagnosis not present

## 2017-04-03 DIAGNOSIS — I10 Essential (primary) hypertension: Secondary | ICD-10-CM | POA: Diagnosis not present

## 2017-04-03 DIAGNOSIS — M79671 Pain in right foot: Secondary | ICD-10-CM | POA: Diagnosis not present

## 2017-04-03 DIAGNOSIS — Z79899 Other long term (current) drug therapy: Secondary | ICD-10-CM | POA: Insufficient documentation

## 2017-04-03 NOTE — Congregational Nurse Program (Signed)
Congregational Nurse Program Note  Date of Encounter: 04/03/2017  Past Medical History: Past Medical History:  Diagnosis Date  . Bipolar depression (HCC)   . Gout   . Hypertension   . Pneumonia    had aspiration pneumonia 5/14-on vent  . Schizophrenia [facility])     Encounter Details:     CNP Questionnaire - 04/03/17 0945      Patient Demographics   Is this a new or existing patient? Existing   Patient is considered a/an Not Applicable   Race African-American/Black     Patient Assistance   Location of Patient Assistance [facility]   Patient's financial/insurance status Medicaid;Medicare;Low Income   Uninsured Patient ([employer] trade [employer]) No   Patient referred to apply for the following financial assistance Not Applicable   Food insecurities addressed Not Applicable   Transportation assistance No   Assistance securing medications Yes   Type of Assistance Other   Educational health offerings Not Applicable     Encounter Details   Primary purpose of visit Post ED/Hospitalization Visit;Education/Health Concerns   Was an Emergency Department visit averted? Not Applicable   Does patient have a medical provider? Yes   Patient referred to Area Agency;Other   Was a mental health screening completed? (GAINS tool) No   Does patient have dental issues? No   Does patient have vision issues? No   Does your patient have an abnormal blood pressure today? Yes   Since previous encounter, have you referred patient for abnormal blood pressure that resulted in a new diagnosis or medication change? No   Does your patient have an abnormal blood glucose today? No   Since previous encounter, have you referred patient for abnormal blood glucose that resulted in a new diagnosis or medication change? No   Was there a life-saving intervention made? No    BP check- client has contact information for [facility]. Client gets his medication from [facility] but has not taken medication this morning as he  couldn't pick it up last week. Client has a legal guardian. Client states he had not had alcohol today but he does hear voices sometimes. Legs- leg edema. Client is having difficulty walking. CN will contact [facility] to let them know that client is waiting hear at shelter.

## 2017-04-03 NOTE — Discharge Instructions (Signed)
You have been seen today for foot pain.  Pain: Take 400 mg of ibuprofen every 6 hours for the next 3 days. After this time, this medication may be used as needed for pain. Take this medication with food to avoid upset stomach.   Tylenol: Should you continue to have additional pain while taking the ibuprofen or naproxen, you may add in tylenol as needed. Your daily total maximum amount of tylenol from all sources should be limited to /day for persons without liver problems, or /day for those with liver problems. Elevation: Keep the extremity elevated as often as possible to reduce pain and inflammation. Follow up: Follow-up with your primary care provider on this matter.

## 2017-04-03 NOTE — ED Provider Notes (Signed)
[facility]-EMERGENCY DEPT Provider Note   [id number] Arrival date & time: 04/03/17  0125     History   Chief Complaint Chief Complaint  Patient presents with  . Leg Pain    HPI Jacarri Gesner is a 46 y.o. male.  HPI   Dalyn Becker is a 46 y.o. male, with a history of HTN, gout, bipolar, and schizophrenia, presenting to the ED with bilateral foot pain for the last 6 weeks. Patient states, "my feet hurt when I walk and they're tired." Patient was somnolent upon initial interview, but was able to be roused. Patient would not give any further information. Patient denies injury, numbness, or weakness.       Past Medical History:  Diagnosis Date  . Bipolar depression (HCC)   . Gout   . Hypertension   . Pneumonia    had aspiration pneumonia 5/14-on vent  . Schizophrenia [facility])     Patient Active Problem List   Diagnosis Date Noted  . Somnolence 01/09/2017  . Paranoid schizophrenia (HCC) 04/20/2013  . Acute respiratory failure with hypercapnia (HCC) 11/17/2012  . Encephalopathy acute 11/17/2012    Past Surgical History:  Procedure Laterality Date  . arm surgery     hurt his arm 25 hr ago  . OPEN REDUCTION INTERNAL FIXATION (ORIF) PROXIMAL PHALANX Right 04/02/2013   Procedure: OPEN REDUCTION INTERNAL FIXATION (ORIF) RIGHT SMALL FINGER;  Surgeon: Tami Ribas, MD;  Location: [facility];  Service: Orthopedics;  Laterality: Right;       Home Medications    Prior to Admission medications   Medication Sig Start Date End Date Taking? Authorizing Provider  albuterol (PROVENTIL HFA;VENTOLIN HFA) 108 (90 Base) MCG/ACT inhaler Inhale 1-2 puffs into the lungs every 6 (six) hours as needed for wheezing or shortness of breath. Patient not taking: Reported on 03/04/2017 12/22/16   Arby Barrette, MD  amLODipine (NORVASC) 10 MG tablet Take 1 tablet (10 mg total) by mouth daily. Patient not taking: Reported on 03/04/2017 01/21/17   Lorre Nick, MD  carbamazepine  (TEGRETOL) 200 MG tablet Take 1 tablet (200 mg total) by mouth 2 (two) times daily. Patient not taking: Reported on 11/25/2016 09/02/16   Charlynne Pander, MD  doxycycline (VIBRAMYCIN) 100 MG capsule Take 1 capsule (100 mg total) by mouth 2 (two) times daily. 03/17/17   Lavera Guise, MD  furosemide (LASIX) 20 MG tablet Take 1 tablet (20 mg total) by mouth daily. Patient not taking: Reported on 03/04/2017 01/21/17   Lorre Nick, MD  furosemide (LASIX) 20 MG tablet Take 1 tablet (20 mg total) by mouth daily. 03/17/17   Lavera Guise, MD  pantoprazole (PROTONIX) 20 MG tablet Take 1 tablet (20 mg total) by mouth daily. Patient not taking: Reported on 03/04/2017 12/22/16   Arby Barrette, MD  potassium chloride (K-DUR) 10 MEQ tablet Take 1 tablet (10 mEq total) by mouth daily. Patient not taking: Reported on 03/04/2017 01/21/17   Lorre Nick, MD  QUEtiapine (SEROQUEL) 50 MG tablet Take 1 tablet (50 mg total) by mouth 3 (three) times daily. Patient not taking: Reported on 03/04/2017 12/22/16   Arby Barrette, MD    Family History Family History  Problem Relation Age of Onset  . Family history unknown: Yes    Social History Social History  Substance Use Topics  . Smoking status: Current Every Day Smoker    Types: Cigarettes  . Smokeless tobacco: Never Used  . Alcohol use Yes     Comment: within  the last week     Allergies   Tomato   Review of Systems Review of Systems  Musculoskeletal: Positive for arthralgias.  Neurological: Negative for weakness and numbness.     Physical Exam Updated Vital Signs BP (!) 145/97 (BP Location: Left Arm)   Pulse 93   Temp 97.9 F (36.6 C) (Oral)   Resp 18   SpO2 99%   Physical Exam  Constitutional: He appears well-developed and well-nourished. No distress.  HENT:  Head: Normocephalic and atraumatic.  Eyes: Pupils are equal, round, and reactive to light. Conjunctivae are normal.  Neck: Neck supple.  Cardiovascular: Normal rate, regular  rhythm, normal heart sounds and intact distal pulses.   Pulmonary/Chest: Effort normal and breath sounds normal. No respiratory distress.  Abdominal: Soft. There is no tenderness. There is no guarding.  Musculoskeletal: He exhibits edema. He exhibits no tenderness.  Bilateral lower extremity edema Nontender to the touch. Lower extremities are warm to the touch bilaterally. No increased warmth or erythema. Patient would comply with range of motion testing in the ankles only. Full range of motion in the patient's ankles.  Lymphadenopathy:    He has no cervical adenopathy.  Neurological:  Patient denies sensory deficits in the bilateral lower extremities. 5/5 strength with plantar and dorsiflexion bilaterally. This is the only strength testing with which the patient would comply. Patient sleeps through most of his time in the ED.  Skin: Skin is warm and dry. Capillary refill takes less than 2 seconds. He is not diaphoretic. No pallor.  Psychiatric: He has a normal mood and affect. His behavior is normal.  Nursing note and vitals reviewed.    ED Treatments / Results  Labs (all labs ordered are listed, but only abnormal results are displayed) Labs Reviewed - No data to display  EKG  EKG Interpretation None       Radiology No results found.  Procedures Procedures (including critical care time)  Medications Ordered in ED Medications - No data to display   Initial Impression / Assessment and Plan / ED Course  I have reviewed the triage vital signs and the nursing notes.  Pertinent labs & imaging results that were available during my care of the patient were reviewed by me and considered in my medical decision making (see chart for details).      Patient presents with complaint of bilateral foot pain. This has been an ongoing complaint for this patient. Patient would not comply with most of the exam. Patient given overall exam due to his somnolence. Patient has signs of adequate  circulation to his extremities. No imaging indicated at this time. Recommend PCP follow-up. Home care and return precautions discussed.    Final Clinical Impressions(s) / ED Diagnoses   Final diagnoses:  Pain in both feet    New Prescriptions New Prescriptions   No medications on file     Concepcion Living 04/03/17 1610    Anselm Pancoast, PA-C 04/03/17 0515    Dione Booze, MD 04/03/17 [phone]

## 2017-04-03 NOTE — ED Triage Notes (Signed)
Patient returned to ED for bilateral leg pain for his Gout flair up.

## 2017-04-05 ENCOUNTER — Encounter (HOSPITAL_COMMUNITY): Payer: Self-pay | Admitting: Family Medicine

## 2017-04-05 ENCOUNTER — Emergency Department (HOSPITAL_COMMUNITY)
Admission: EM | Admit: 2017-04-05 | Discharge: 2017-04-05 | Discharge status: Against Medical Advice | Payer: Medicare Other | Attending: Emergency Medicine | Admitting: Emergency Medicine

## 2017-04-05 DIAGNOSIS — I10 Essential (primary) hypertension: Secondary | ICD-10-CM | POA: Insufficient documentation

## 2017-04-05 DIAGNOSIS — M79605 Pain in left leg: Secondary | ICD-10-CM | POA: Diagnosis present

## 2017-04-05 DIAGNOSIS — M79671 Pain in right foot: Secondary | ICD-10-CM | POA: Diagnosis not present

## 2017-04-05 DIAGNOSIS — M79604 Pain in right leg: Secondary | ICD-10-CM | POA: Insufficient documentation

## 2017-04-05 DIAGNOSIS — F17210 Nicotine dependence, cigarettes, uncomplicated: Secondary | ICD-10-CM | POA: Insufficient documentation

## 2017-04-05 DIAGNOSIS — M79672 Pain in left foot: Secondary | ICD-10-CM | POA: Diagnosis not present

## 2017-04-05 NOTE — ED Notes (Signed)
Patient refused to take off clothes or let me see his foot. Patient walked out of the ER when RN was in another room.

## 2017-04-05 NOTE — ED Triage Notes (Signed)
Patient was transported via [facility]. Per EMS, patient was kicked out of group home and reports he does not have his medication. Patient is complaining of right foot swelling, which is present. Patient has been seen on 09/23 and 09/24 for same complaints. Patient was escorted off of property for his behavior on 09/23 before being assessed. While triaging patient, he was sleeping and snoring.

## 2017-04-05 NOTE — ED Provider Notes (Signed)
[facility]-EMERGENCY DEPT Provider Note   CSN: [id number] Arrival date & time: 04/05/17  2952     History   Chief Complaint Chief Complaint  Patient presents with  . Foot Pain    HPI [name] [name] is a 46 y.o. male with a h/o of HTN, gout, bipolar disorder, and schizophrenia who presents to the emergency department with a chief complaint of bilateral foot pain x 6 weeks. He was seen on 9/24 for the same. EMS notes the patient was kicked out of his group home. Patient is somnolent and snoring and continues to fall asleep mid-sentence through our initial interview, but is arousable. No other complaints at this time. The patient is requesting a sandwich.  The history is provided by the patient. No language interpreter was used.    Past Medical History:  Diagnosis Date  . Bipolar depression (HCC)   . Gout   . Hypertension   . Pneumonia    had aspiration pneumonia 5/14-on vent  . Schizophrenia [facility])     Patient Active Problem List   Diagnosis Date Noted  . Somnolence 01/09/2017  . Paranoid schizophrenia (HCC) 04/20/2013  . Acute respiratory failure with hypercapnia (HCC) 11/17/2012  . Encephalopathy acute 11/17/2012    Past Surgical History:  Procedure Laterality Date  . arm surgery     hurt his arm 25 hr ago  . OPEN REDUCTION INTERNAL FIXATION (ORIF) PROXIMAL PHALANX Right 04/02/2013   Procedure: OPEN REDUCTION INTERNAL FIXATION (ORIF) RIGHT SMALL FINGER;  Surgeon: Tami Ribas, MD;  Location: [facility];  Service: Orthopedics;  Laterality: Right;       Home Medications    Prior to Admission medications   Medication Sig Start Date End Date Taking? Authorizing Provider  albuterol (PROVENTIL HFA;VENTOLIN HFA) 108 (90 Base) MCG/ACT inhaler Inhale 1-2 puffs into the lungs every 6 (six) hours as needed for wheezing or shortness of breath. Patient not taking: Reported on 03/04/2017 12/22/16   Arby Barrette, MD  amLODipine (NORVASC) 10 MG tablet Take 1 tablet (10  mg total) by mouth daily. Patient not taking: Reported on 03/04/2017 01/21/17   Lorre Nick, MD  carbamazepine (TEGRETOL) 200 MG tablet Take 1 tablet (200 mg total) by mouth 2 (two) times daily. Patient not taking: Reported on 11/25/2016 09/02/16   Charlynne Pander, MD  doxycycline (VIBRAMYCIN) 100 MG capsule Take 1 capsule (100 mg total) by mouth 2 (two) times daily. 03/17/17   Lavera Guise, MD  furosemide (LASIX) 20 MG tablet Take 1 tablet (20 mg total) by mouth daily. Patient not taking: Reported on 03/04/2017 01/21/17   Lorre Nick, MD  furosemide (LASIX) 20 MG tablet Take 1 tablet (20 mg total) by mouth daily. 03/17/17   Lavera Guise, MD  pantoprazole (PROTONIX) 20 MG tablet Take 1 tablet (20 mg total) by mouth daily. Patient not taking: Reported on 03/04/2017 12/22/16   Arby Barrette, MD  potassium chloride (K-DUR) 10 MEQ tablet Take 1 tablet (10 mEq total) by mouth daily. Patient not taking: Reported on 03/04/2017 01/21/17   Lorre Nick, MD  QUEtiapine (SEROQUEL) 50 MG tablet Take 1 tablet (50 mg total) by mouth 3 (three) times daily. Patient not taking: Reported on 03/04/2017 12/22/16   Arby Barrette, MD    Family History Family History  Problem Relation Age of Onset  . Family history unknown: Yes    Social History Social History  Substance Use Topics  . Smoking status: Current Every Day Smoker    Types: Cigarettes  .  Smokeless tobacco: Never Used  . Alcohol use Yes     Comment: Unknown last drink.      Allergies   Tomato   Review of Systems Review of Systems  Constitutional: Negative for fever.  Musculoskeletal: Positive for arthralgias and myalgias.   Physical Exam Updated Vital Signs BP (!) 160/94 (BP Location: Right Arm)   Pulse 76   Temp 98.3 F (36.8 C) (Oral)   Resp 18   SpO2 100%   Physical Exam  Constitutional: He appears well-developed.  Appears disheveled. The front of the patient's pants appears to be soaked with urine.  HENT:  Head:  Normocephalic.  Eyes: Conjunctivae are normal.  Neck: Neck supple.  Cardiovascular: Normal rate, regular rhythm and normal heart sounds.  Exam reveals no gallop and no friction rub.   No murmur heard. Pulmonary/Chest: Effort normal and breath sounds normal. No respiratory distress. He has no wheezes. He has no rales.  Abdominal: Soft. He exhibits no distension.  Protuberant abdomen.  Musculoskeletal: He exhibits no tenderness.  No TTP of the bilateral lower legs or feet. 5 out of 5 strength of the bilateral lower extremities. Full range of motion of the bilateral ankles. Able to bear weight on the bilateral lower extremities. Sensation is intact. DP pulses are 2+ bilaterally. The bilateral lower extremities are warm to the touch and edematous. No focal warmth or edema.  Neurological: He is alert.  Follows commands.  Skin: Skin is warm and dry.  Psychiatric: His behavior is normal.  Nursing note and vitals reviewed.  ED Treatments / Results  Labs (all labs ordered are listed, but only abnormal results are displayed) Labs Reviewed - No data to display  EKG  EKG Interpretation None       Radiology No results found.  Procedures Procedures (including critical care time)  Medications Ordered in ED Medications - No data to display   Initial Impression / Assessment and Plan / ED Course  I have reviewed the triage vital signs and the nursing notes.  Pertinent labs & imaging results that were available during my care of the patient were reviewed by me and considered in my medical decision making (see chart for details).     46 year old male presenting with bilateral foot pain. Of note, the patient has 21 ED visits in the last 6 months. He was evaluated for the same complaint on 04/03/2017. The patient is somnolent, but arousable to voice. On re-examination, the patient is able to stand and requesting a urinal to void. He would also like a sandwich. Orders placed to fluid challenge  the patient and ambulate him throughout the department; however, nursing staff informs me that the patient has eloped AGAINST MEDICAL ADVICE.  Final Clinical Impressions(s) / ED Diagnoses   Final diagnoses:  Bilateral leg and foot pain    New Prescriptions Discharge Medication List as of 04/05/2017 12:06 PM       Barkley Boards, PA-C 04/05/17 1725    Shaune Pollack, MD 04/09/17 Barry Brunner

## 2017-04-05 NOTE — ED Notes (Signed)
Patient refusing to put on gown, states that he is here for his foot. Patient also asking for foot. Notified patient that he has to bee seen first.

## 2017-04-12 ENCOUNTER — Emergency Department (HOSPITAL_COMMUNITY)
Admission: EM | Admit: 2017-04-12 | Discharge: 2017-04-12 | Discharge status: Against Medical Advice | Payer: Medicare Other | Attending: Emergency Medicine | Admitting: Emergency Medicine

## 2017-04-12 ENCOUNTER — Encounter (HOSPITAL_COMMUNITY): Payer: Self-pay | Admitting: Emergency Medicine

## 2017-04-12 DIAGNOSIS — F22 Delusional disorders: Secondary | ICD-10-CM | POA: Insufficient documentation

## 2017-04-12 DIAGNOSIS — R2243 Localized swelling, mass and lump, lower limb, bilateral: Secondary | ICD-10-CM | POA: Diagnosis not present

## 2017-04-12 LAB — COMPREHENSIVE METABOLIC PANEL
ALBUMIN: 3.7 g/dL (ref 3.5–5.0)
ALT: 16 U/L — ABNORMAL LOW (ref 17–63)
AST: 24 U/L (ref 15–41)
Alkaline Phosphatase: 69 U/L (ref 38–126)
Anion gap: 9 (ref 5–15)
BUN: 10 mg/dL (ref 6–20)
CHLORIDE: 99 mmol/L — AB (ref 101–111)
CO2: 27 mmol/L (ref 22–32)
Calcium: 9 mg/dL (ref 8.9–10.3)
Creatinine, Ser: 0.82 mg/dL (ref 0.61–1.24)
GFR calc Af Amer: >60 mL/min (ref >60)
GFR calc non Af Amer: >60 mL/min (ref >60)
GLUCOSE: 100 mg/dL — AB (ref 65–99)
POTASSIUM: 3.7 mmol/L (ref 3.5–5.1)
SODIUM: 135 mmol/L (ref 135–145)
Total Bilirubin: 0.3 mg/dL (ref 0.3–1.2)
Total Protein: 7.4 g/dL (ref 6.5–8.1)

## 2017-04-12 LAB — ACETAMINOPHEN LEVEL: Acetaminophen (Tylenol), Serum: <10 ug/mL — ABNORMAL LOW (ref 10–30)

## 2017-04-12 LAB — SALICYLATE LEVEL: Salicylate Lvl: <7 mg/dL (ref 2.8–30.0)

## 2017-04-12 LAB — RAPID URINE DRUG SCREEN, HOSP PERFORMED
AMPHETAMINES: NOT DETECTED
BENZODIAZEPINES: NOT DETECTED
Barbiturates: NOT DETECTED
Cocaine: NOT DETECTED
Opiates: NOT DETECTED
Tetrahydrocannabinol: POSITIVE — AB

## 2017-04-12 LAB — CBC
HCT: 39.1 % (ref 39.0–52.0)
Hemoglobin: 13.2 g/dL (ref 13.0–17.0)
MCH: 27.5 pg (ref 26.0–34.0)
MCHC: 33.8 g/dL (ref 30.0–36.0)
MCV: 81.5 fL (ref 78.0–100.0)
PLATELETS: 221 10*3/uL (ref 150–400)
RBC: 4.8 MIL/uL (ref 4.22–5.81)
RDW: 15.5 % (ref 11.5–15.5)
WBC: 12.2 10*3/uL — AB (ref 4.0–10.5)

## 2017-04-12 LAB — ETHANOL: Alcohol, Ethyl (B): <10 mg/dL (ref <10)

## 2017-04-12 NOTE — ED Notes (Signed)
Bed: WA21 Expected date:  Expected time:  Means of arrival:  Comments: 46 yo M  Leg swelling and pain

## 2017-04-12 NOTE — ED Triage Notes (Addendum)
Brought in by EMS [place] bus stop with c/o bilateral leg swelling and pain---- hx of CHF and gout.  Pt reported that he was prescribed "antibiotics for his gout but was unable to fill it, and now symptoms have gotten worse".  Pt also remarked that he wanted to go to [facility] once "he is medically cleared".  Pt stated that he "left group home because they are trying to kill me".  Pt denies SI/HI.

## 2017-04-13 ENCOUNTER — Emergency Department (HOSPITAL_COMMUNITY)
Admission: EM | Admit: 2017-04-13 | Discharge: 2017-04-14 | Disposition: A | Discharge status: Home / Self Care | Payer: Medicare Other | Attending: Emergency Medicine | Admitting: Emergency Medicine

## 2017-04-13 ENCOUNTER — Encounter (HOSPITAL_COMMUNITY): Payer: Self-pay | Admitting: Emergency Medicine

## 2017-04-13 DIAGNOSIS — R4182 Altered mental status, unspecified: Secondary | ICD-10-CM | POA: Diagnosis present

## 2017-04-13 DIAGNOSIS — I10 Essential (primary) hypertension: Secondary | ICD-10-CM | POA: Insufficient documentation

## 2017-04-13 DIAGNOSIS — Z79899 Other long term (current) drug therapy: Secondary | ICD-10-CM | POA: Diagnosis not present

## 2017-04-13 DIAGNOSIS — F19920 Other psychoactive substance use, unspecified with intoxication, uncomplicated: Secondary | ICD-10-CM | POA: Diagnosis not present

## 2017-04-13 DIAGNOSIS — F1410 Cocaine abuse, uncomplicated: Secondary | ICD-10-CM

## 2017-04-13 DIAGNOSIS — F17210 Nicotine dependence, cigarettes, uncomplicated: Secondary | ICD-10-CM | POA: Diagnosis not present

## 2017-04-13 NOTE — ED Triage Notes (Signed)
Pt brought in by police for medical clearance  Pt states he needs assistance with his mental health issues and his medications  Pt here voluntarily  Pt snoring in triage  Pt stutters with speech

## 2017-04-13 NOTE — ED Notes (Signed)
Pt resides at a group home

## 2017-04-14 LAB — CBC WITH DIFFERENTIAL/PLATELET
BASOS ABS: 0.1 10*3/uL (ref 0.0–0.1)
BASOS PCT: 1 %
EOS ABS: 0.4 10*3/uL (ref 0.0–0.7)
EOS PCT: 4 %
HCT: 40.1 % (ref 39.0–52.0)
Hemoglobin: 12.9 g/dL — ABNORMAL LOW (ref 13.0–17.0)
Lymphocytes Relative: 19 %
Lymphs Abs: 1.8 10*3/uL (ref 0.7–4.0)
MCH: 26.8 pg (ref 26.0–34.0)
MCHC: 32.2 g/dL (ref 30.0–36.0)
MCV: 83.4 fL (ref 78.0–100.0)
MONO ABS: 0.8 10*3/uL (ref 0.1–1.0)
Monocytes Relative: 8 %
Neutro Abs: 6.5 10*3/uL (ref 1.7–7.7)
Neutrophils Relative %: 68 %
PLATELETS: 196 10*3/uL (ref 150–400)
RBC: 4.81 MIL/uL (ref 4.22–5.81)
RDW: 15.6 % — AB (ref 11.5–15.5)
WBC: 9.5 10*3/uL (ref 4.0–10.5)

## 2017-04-14 LAB — COMPREHENSIVE METABOLIC PANEL
ALBUMIN: 3.7 g/dL (ref 3.5–5.0)
ALK PHOS: 67 U/L (ref 38–126)
ALT: 17 U/L (ref 17–63)
AST: 24 U/L (ref 15–41)
Anion gap: 8 (ref 5–15)
BILIRUBIN TOTAL: 0.5 mg/dL (ref 0.3–1.2)
BUN: 7 mg/dL (ref 6–20)
CALCIUM: 9.1 mg/dL (ref 8.9–10.3)
CO2: 25 mmol/L (ref 22–32)
Chloride: 102 mmol/L (ref 101–111)
Creatinine, Ser: 0.74 mg/dL (ref 0.61–1.24)
GFR calc Af Amer: >60 mL/min (ref >60)
GLUCOSE: 103 mg/dL — AB (ref 65–99)
POTASSIUM: 3.9 mmol/L (ref 3.5–5.1)
Sodium: 135 mmol/L (ref 135–145)
TOTAL PROTEIN: 7.6 g/dL (ref 6.5–8.1)

## 2017-04-14 LAB — BLOOD GAS, ARTERIAL
ACID-BASE EXCESS: 0.3 mmol/L (ref 0.0–2.0)
BICARBONATE: 25.9 mmol/L (ref 20.0–28.0)
DRAWN BY: 232811
FIO2: 21
O2 SAT: 92 %
PATIENT TEMPERATURE: 98.6
PO2 ART: 68.2 mmHg — AB (ref 83.0–108.0)
pCO2 arterial: 48.4 mmHg — ABNORMAL HIGH (ref 32.0–48.0)
pH, Arterial: 7.347 — ABNORMAL LOW (ref 7.350–7.450)

## 2017-04-14 LAB — CBG MONITORING, ED: GLUCOSE-CAPILLARY: 108 mg/dL — AB (ref 65–99)

## 2017-04-14 LAB — RAPID URINE DRUG SCREEN, HOSP PERFORMED
Amphetamines: NOT DETECTED
BARBITURATES: NOT DETECTED
BENZODIAZEPINES: NOT DETECTED
Cocaine: POSITIVE — AB
Opiates: NOT DETECTED
Tetrahydrocannabinol: NOT DETECTED

## 2017-04-14 LAB — ETHANOL: Alcohol, Ethyl (B): <10 mg/dL (ref <10)

## 2017-04-14 LAB — AMMONIA: AMMONIA: 71 umol/L — AB (ref 9–35)

## 2017-04-14 MED ORDER — NALOXONE HCL 0.4 MG/ML IJ SOLN
0.4000 mg | Freq: Once | INTRAMUSCULAR | Status: DC
  Filled 2017-04-14: qty 1

## 2017-04-14 MED ORDER — THIAMINE HCL 100 MG/ML IJ SOLN
100.0000 mg | Freq: Once | INTRAMUSCULAR | Status: AC
  Administered 2017-04-14: 100 mg via INTRAVENOUS
  Filled 2017-04-14: qty 2

## 2017-04-14 MED ORDER — ONDANSETRON HCL 4 MG/2ML IJ SOLN
4.0000 mg | Freq: Once | INTRAMUSCULAR | Status: DC
Start: 2017-04-14 — End: 2017-04-14

## 2017-04-14 NOTE — ED Notes (Signed)
At shift change, plan to move pt to TCU. Per oncoming MD assessment the pt will be planned for discharge. Pt will not be moved at this time.

## 2017-04-14 NOTE — ED Provider Notes (Signed)
[facility] DEPT Provider Note   CSN: [id number] Arrival date & time: 04/13/17  2237   LEVEL 5 CAVEAT - ALTERED MENTAL STATUS  History   Chief Complaint Chief Complaint  Patient presents with  . Medical Clearance    HPI [name] [name] is a 46 y.o. male.  HPI  46 year old male brought in by police. He told them that he needs assistance with his mental health issues and medications. He was brought in at around 11 PM. By the time I saw the patient, he is snoring and difficult to arouse. Sometimes he mumbles when awoken. Unclear if he took alcohol tonight, though he does drink frequently and is seen for ETOH intoxication in the past.  He was found at a gas station by police and then brought in. Otherwise the history is quite limited due to his change in mental status. Nurse tells me he took his seroquel while in the triage room.  Past Medical History:  Diagnosis Date  . Bipolar depression (HCC)   . Gout   . Hypertension   . Pneumonia    had aspiration pneumonia 5/14-on vent  . Schizophrenia [facility])     Patient Active Problem List   Diagnosis Date Noted  . Somnolence 01/09/2017  . Paranoid schizophrenia (HCC) 04/20/2013  . Acute respiratory failure with hypercapnia (HCC) 11/17/2012  . Encephalopathy acute 11/17/2012    Past Surgical History:  Procedure Laterality Date  . arm surgery     hurt his arm 25 hr ago  . OPEN REDUCTION INTERNAL FIXATION (ORIF) PROXIMAL PHALANX Right 04/02/2013   Procedure: OPEN REDUCTION INTERNAL FIXATION (ORIF) RIGHT SMALL FINGER;  Surgeon: Tami Ribas, MD;  Location: [facility];  Service: Orthopedics;  Laterality: Right;       Home Medications    Prior to Admission medications   Medication Sig Start Date End Date Taking? Authorizing Provider  albuterol (PROVENTIL HFA;VENTOLIN HFA) 108 (90 Base) MCG/ACT inhaler Inhale 1-2 puffs into the lungs every 6 (six) hours as needed for wheezing or shortness of breath. Patient not  taking: Reported on 03/04/2017 12/22/16   Arby Barrette, MD  amLODipine (NORVASC) 10 MG tablet Take 1 tablet (10 mg total) by mouth daily. Patient not taking: Reported on 03/04/2017 01/21/17   Lorre Nick, MD  carbamazepine (TEGRETOL) 200 MG tablet Take 1 tablet (200 mg total) by mouth 2 (two) times daily. Patient not taking: Reported on 11/25/2016 09/02/16   Charlynne Pander, MD  doxycycline (VIBRAMYCIN) 100 MG capsule Take 1 capsule (100 mg total) by mouth 2 (two) times daily. Patient not taking: Reported on 04/13/2017 03/17/17   Lavera Guise, MD  furosemide (LASIX) 20 MG tablet Take 1 tablet (20 mg total) by mouth daily. Patient not taking: Reported on 03/04/2017 01/21/17   Lorre Nick, MD  furosemide (LASIX) 20 MG tablet Take 1 tablet (20 mg total) by mouth daily. 03/17/17   Lavera Guise, MD  pantoprazole (PROTONIX) 20 MG tablet Take 1 tablet (20 mg total) by mouth daily. Patient not taking: Reported on 03/04/2017 12/22/16   Arby Barrette, MD  potassium chloride (K-DUR) 10 MEQ tablet Take 1 tablet (10 mEq total) by mouth daily. Patient not taking: Reported on 03/04/2017 01/21/17   Lorre Nick, MD  QUEtiapine (SEROQUEL) 50 MG tablet Take 1 tablet (50 mg total) by mouth 3 (three) times daily. Patient not taking: Reported on 03/04/2017 12/22/16   Arby Barrette, MD    Family History Family History  Problem Relation Age of Onset  .  Family history unknown: Yes    Social History Social History  Substance Use Topics  . Smoking status: Current Every Day Smoker    Types: Cigarettes  . Smokeless tobacco: Never Used  . Alcohol use Yes     Comment: Unknown last drink.      Allergies   Tomato   Review of Systems Review of Systems  Unable to perform ROS: Mental status change     Physical Exam Updated Vital Signs BP (!) 152/77 (BP Location: Left Wrist)   Pulse 79   Temp 98.6 F (37 C) (Oral)   Resp 20   SpO2 97%   Physical Exam  Constitutional: He appears well-developed and  well-nourished.  Morbidly obese  HENT:  Head: Normocephalic and atraumatic.  Right Ear: External ear normal.  Left Ear: External ear normal.  Nose: Nose normal.  Eyes: Right eye exhibits no discharge. Left eye exhibits no discharge.  Miotic pupils  Neck: Neck supple.  Cardiovascular: Normal rate, regular rhythm and normal heart sounds.   Pulmonary/Chest: Effort normal and breath sounds normal. He has no wheezes. He has no rales.  Snoring loudly. Transmitted upper airway sounds but no stridor, wheezing or rales.  Abdominal: Soft. There is no tenderness.  Musculoskeletal: He exhibits edema (chronic appearing BLE edema).  Neurological:  Sleeping. Difficult to arouse but when his eyelids are held open he moves to a more comfortable position. Occasionally mumbles and then falls back asleep. He was able to reposition himself in the stretcher when asked.  Skin: Skin is warm and dry.  Nursing note and vitals reviewed.    ED Treatments / Results  Labs (all labs ordered are listed, but only abnormal results are displayed) Labs Reviewed  COMPREHENSIVE METABOLIC PANEL - Abnormal; Notable for the following:       Result Value   Glucose, Bld 103 (*)    All other components within normal limits  CBC WITH DIFFERENTIAL/PLATELET - Abnormal; Notable for the following:    Hemoglobin 12.9 (*)    RDW 15.6 (*)    All other components within normal limits  AMMONIA - Abnormal; Notable for the following:    Ammonia 71 (*)    All other components within normal limits  BLOOD GAS, ARTERIAL - Abnormal; Notable for the following:    pH, Arterial 7.347 (*)    pCO2 arterial 48.4 (*)    pO2, Arterial 68.2 (*)    All other components within normal limits  RAPID URINE DRUG SCREEN, HOSP PERFORMED - Abnormal; Notable for the following:    Cocaine POSITIVE (*)    All other components within normal limits  CBG MONITORING, ED - Abnormal; Notable for the following:    Glucose-Capillary 108 (*)    All other  components within normal limits  ETHANOL    EKG  EKG Interpretation None       Radiology No results found.  Procedures Procedures (including critical care time)  Medications Ordered in ED Medications  thiamine (B-1) injection 100 mg (100 mg Intravenous Given 04/14/17 4098)     Initial Impression / Assessment and Plan / ED Course  I have reviewed the triage vital signs and the nursing notes.  Pertinent labs & imaging results that were available during my care of the patient were reviewed by me and considered in my medical decision making (see chart for details).     Patient was observed in the ED overnight. He remained somnolent and snoring but was arousable. Now on reevaluation at around  7:30 AM he is more awake and alert but does easily fall asleep. He is requesting to go home. He is able to ambulate. He tells me that he did use cocaine last night. Likely some coingestants as well including the Seroquel he took prior to me seeing him. However now that he is awake, he denies suicidal or homicidal thoughts. He does not appear acutely psychotic or in need of an acute psychiatric consult/disposition. This appears to be intoxication related. Discharge home with return per cautions.  Final Clinical Impressions(s) / ED Diagnoses   Final diagnoses:  Drug intoxication without complication (HCC)  Cocaine abuse (HCC)    New Prescriptions New Prescriptions   No medications on file     Pricilla Loveless, MD 04/14/17 [phone]

## 2017-04-14 NOTE — ED Provider Notes (Signed)
MSE was initiated and I personally evaluated the patient and placed orders (if any) at  12:34 AM on April 14, 2017.  The patient appears stable so that the remainder of the MSE may be completed by another provider.  46 year old male with a past medical history of paranoid schizophrenia frequent ER visits for edema and alcohol intoxication brought in by [facility] after asking to come here for help with his medications. There is a level V caveat due to excessive somnolence. I'm unable to gather any history from the patient.  The patient extremely somnolent and snoring loudly with episodes of apnea. Patient would not arouse. I used a sternal rub technique and he did not arouse. I did use a an ammonia inhalant and the patient did sit upright however his speech was unintelligible and he was extremely somnolent and fell right back to sleep. At this point I decided to proceed with altered mental status workup given his history of hypercapnic respiratory failure. He appears to have significant obstructive sleep apnea. Patient will receive the workup. I discussed the case with Dr. Criss Alvine. We will hold on head CT at this time as her set evidence of trauma unless he is not improving.      Arthor Captain, PA-C 04/14/17 1610    Pricilla Loveless, MD 04/14/17 [phone]

## 2017-04-15 ENCOUNTER — Encounter (HOSPITAL_COMMUNITY): Payer: Self-pay | Admitting: Emergency Medicine

## 2017-04-15 ENCOUNTER — Emergency Department (HOSPITAL_COMMUNITY)
Admission: EM | Admit: 2017-04-15 | Discharge: 2017-04-16 | Disposition: A | Discharge status: Home / Self Care | Payer: Medicare Other | Attending: Emergency Medicine | Admitting: Emergency Medicine

## 2017-04-15 DIAGNOSIS — F17210 Nicotine dependence, cigarettes, uncomplicated: Secondary | ICD-10-CM | POA: Diagnosis not present

## 2017-04-15 DIAGNOSIS — R400 Somnolence: Secondary | ICD-10-CM | POA: Diagnosis present

## 2017-04-15 DIAGNOSIS — Z5329 Procedure and treatment not carried out because of patient's decision for other reasons: Secondary | ICD-10-CM | POA: Diagnosis not present

## 2017-04-15 DIAGNOSIS — I10 Essential (primary) hypertension: Secondary | ICD-10-CM | POA: Diagnosis not present

## 2017-04-15 LAB — COMPREHENSIVE METABOLIC PANEL
ALT: 17 U/L (ref 17–63)
AST: 25 U/L (ref 15–41)
Albumin: 3.6 g/dL (ref 3.5–5.0)
Alkaline Phosphatase: 73 U/L (ref 38–126)
Anion gap: 6 (ref 5–15)
BILIRUBIN TOTAL: 0.4 mg/dL (ref 0.3–1.2)
BUN: 12 mg/dL (ref 6–20)
CHLORIDE: 101 mmol/L (ref 101–111)
CO2: 28 mmol/L (ref 22–32)
CREATININE: 0.79 mg/dL (ref 0.61–1.24)
Calcium: 8.9 mg/dL (ref 8.9–10.3)
Glucose, Bld: 105 mg/dL — ABNORMAL HIGH (ref 65–99)
POTASSIUM: 3.7 mmol/L (ref 3.5–5.1)
Sodium: 135 mmol/L (ref 135–145)
TOTAL PROTEIN: 7.4 g/dL (ref 6.5–8.1)

## 2017-04-15 LAB — CBC WITH DIFFERENTIAL/PLATELET
BASOS ABS: 0.1 10*3/uL (ref 0.0–0.1)
BASOS PCT: 1 %
EOS ABS: 0.2 10*3/uL (ref 0.0–0.7)
EOS PCT: 2 %
HCT: 39.9 % (ref 39.0–52.0)
Hemoglobin: 13 g/dL (ref 13.0–17.0)
LYMPHS PCT: 15 %
Lymphs Abs: 1.5 10*3/uL (ref 0.7–4.0)
MCH: 27.3 pg (ref 26.0–34.0)
MCHC: 32.6 g/dL (ref 30.0–36.0)
MCV: 83.6 fL (ref 78.0–100.0)
MONO ABS: 1.1 10*3/uL — AB (ref 0.1–1.0)
Monocytes Relative: 11 %
Neutro Abs: 7.3 10*3/uL (ref 1.7–7.7)
Neutrophils Relative %: 71 %
PLATELETS: 180 10*3/uL (ref 150–400)
RBC: 4.77 MIL/uL (ref 4.22–5.81)
RDW: 15.4 % (ref 11.5–15.5)
WBC: 10.3 10*3/uL (ref 4.0–10.5)

## 2017-04-15 LAB — ETHANOL

## 2017-04-15 NOTE — ED Triage Notes (Signed)
Pt presents from EMS for mental evaluation. EMS advised that pt admitted to using alcohol and cocaine. Pt snoring at time of triage. Pt has incomprehensible speech and not staying awake to answer triage questions.

## 2017-04-15 NOTE — ED Provider Notes (Signed)
[facility] DEPT Provider Note   CSN: [id number] Arrival date & time: 04/15/17  2221     History   Chief Complaint Chief Complaint  Patient presents with  . Medical Clearance    HPI Dwain Huhn is a 46 y.o. male.  Patient is a 46 year old male with history of bipolar, hypertension, and schizophrenia. He is brought by ambulance for evaluation of somnolence. Apparently the patient was found difficult to arouse somewhere downtown. It appears as though he is homeless and there was a concern that he was intoxicated. Patient is very somnolent and has difficult to comprehend, garbled speech but denies to me that he has been drinking. He denies any physical complaints. He denies any suicidal or homicidal ideation. He denies any auditory or visual hallucinations. He has been seen here multiple occasions in the past with similar presentations.   The history is provided by the patient.    Past Medical History:  Diagnosis Date  . Bipolar depression (HCC)   . Gout   . Hypertension   . Pneumonia    had aspiration pneumonia 5/14-on vent  . Schizophrenia [facility])     Patient Active Problem List   Diagnosis Date Noted  . Somnolence 01/09/2017  . Paranoid schizophrenia (HCC) 04/20/2013  . Acute respiratory failure with hypercapnia (HCC) 11/17/2012  . Encephalopathy acute 11/17/2012    Past Surgical History:  Procedure Laterality Date  . arm surgery     hurt his arm 25 hr ago  . OPEN REDUCTION INTERNAL FIXATION (ORIF) PROXIMAL PHALANX Right 04/02/2013   Procedure: OPEN REDUCTION INTERNAL FIXATION (ORIF) RIGHT SMALL FINGER;  Surgeon: Tami Ribas, MD;  Location: [facility];  Service: Orthopedics;  Laterality: Right;       Home Medications    Prior to Admission medications   Medication Sig Start Date End Date Taking? Authorizing Provider  albuterol (PROVENTIL HFA;VENTOLIN HFA) 108 (90 Base) MCG/ACT inhaler Inhale 1-2 puffs into the lungs every 6 (six) hours as  needed for wheezing or shortness of breath. Patient not taking: Reported on 03/04/2017 12/22/16   Arby Barrette, MD  amLODipine (NORVASC) 10 MG tablet Take 1 tablet (10 mg total) by mouth daily. Patient not taking: Reported on 03/04/2017 01/21/17   Lorre Nick, MD  carbamazepine (TEGRETOL) 200 MG tablet Take 1 tablet (200 mg total) by mouth 2 (two) times daily. Patient not taking: Reported on 11/25/2016 09/02/16   Charlynne Pander, MD  doxycycline (VIBRAMYCIN) 100 MG capsule Take 1 capsule (100 mg total) by mouth 2 (two) times daily. Patient not taking: Reported on 04/13/2017 03/17/17   Lavera Guise, MD  furosemide (LASIX) 20 MG tablet Take 1 tablet (20 mg total) by mouth daily. Patient not taking: Reported on 03/04/2017 01/21/17   Lorre Nick, MD  furosemide (LASIX) 20 MG tablet Take 1 tablet (20 mg total) by mouth daily. Patient not taking: Reported on 04/15/2017 03/17/17   Lavera Guise, MD  pantoprazole (PROTONIX) 20 MG tablet Take 1 tablet (20 mg total) by mouth daily. Patient not taking: Reported on 03/04/2017 12/22/16   Arby Barrette, MD  potassium chloride (K-DUR) 10 MEQ tablet Take 1 tablet (10 mEq total) by mouth daily. Patient not taking: Reported on 03/04/2017 01/21/17   Lorre Nick, MD  QUEtiapine (SEROQUEL) 50 MG tablet Take 1 tablet (50 mg total) by mouth 3 (three) times daily. Patient not taking: Reported on 03/04/2017 12/22/16   Arby Barrette, MD    Family History Family History  Problem Relation Age  of Onset  . Family history unknown: Yes    Social History Social History  Substance Use Topics  . Smoking status: Current Every Day Smoker    Types: Cigarettes  . Smokeless tobacco: Never Used  . Alcohol use Yes     Comment: Unknown last drink.      Allergies   Tomato   Review of Systems Review of Systems  All other systems reviewed and are negative.    Physical Exam Updated Vital Signs BP (!) 148/92 (BP Location: Left Arm)   Pulse 81   Temp 98.1 F (36.7  C) (Oral)   Resp 20   SpO2 96%   Physical Exam  Constitutional: He is oriented to person, place, and time. He appears well-developed and well-nourished. No distress.  HENT:  Head: Normocephalic and atraumatic.  Mouth/Throat: Oropharynx is clear and moist.  Neck: Normal range of motion. Neck supple.  Cardiovascular: Normal rate and regular rhythm.  Exam reveals no friction rub.   No murmur heard. Pulmonary/Chest: Effort normal and breath sounds normal. No respiratory distress. He has no wheezes. He has no rales.  Abdominal: Soft. Bowel sounds are normal. He exhibits no distension. There is no tenderness.  Musculoskeletal: Normal range of motion. He exhibits no edema.  Neurological: He is alert and oriented to person, place, and time. Coordination normal.  Patient is very difficult to arouse and is sleeping soundly. He has snoring respirations. Neurologic exam is difficult secondary to his somnolence. He does move all extremities and there are no obvious neurologic deficits.  Skin: Skin is warm and dry. He is not diaphoretic.  Nursing note and vitals reviewed.    ED Treatments / Results  Labs (all labs ordered are listed, but only abnormal results are displayed) Labs Reviewed  CBC WITH DIFFERENTIAL/PLATELET - Abnormal; Notable for the following:       Result Value   Monocytes Absolute 1.1 (*)    All other components within normal limits  COMPREHENSIVE METABOLIC PANEL  ETHANOL  RAPID URINE DRUG SCREEN, HOSP PERFORMED    EKG  EKG Interpretation None       Radiology No results found.  Procedures Procedures (including critical care time)  Medications Ordered in ED Medications - No data to display   Initial Impression / Assessment and Plan / ED Course  I have reviewed the triage vital signs and the nursing notes.  Pertinent labs & imaging results that were available during my care of the patient were reviewed by me and considered in my medical decision making (see  chart for details).  Patient brought for evaluation of somnolence. Appears as though he is homeless and was found asleep somewhere in downtown [place]. He does not appear intoxicated and his blood alcohol is negative. The patient slept here for several hours, then woke and eloped. He denied to me he was experiencing any suicidal or homicidal ideation. He did not appear to be a danger to himself or others. His workup is unremarkable. I feel as though he has the decision-making capacity to elope.  Final Clinical Impressions(s) / ED Diagnoses   Final diagnoses:  None    New Prescriptions New Prescriptions   No medications on file     Geoffery Lyons, MD 04/16/17 [phone]

## 2017-04-15 NOTE — ED Notes (Signed)
PT HAD DRAWN FOR LABS:  GOLD BLUE LAVENDER LT GREEN  DARK GREEN X2

## 2017-04-15 NOTE — ED Notes (Signed)
Bed: [facility] Expected date:  Expected time:  Means of arrival:  Comments: 

## 2017-04-16 NOTE — ED Notes (Signed)
Pt ambulated out of department without notifying staff. Dr.Delo notified.

## 2017-04-17 ENCOUNTER — Encounter (HOSPITAL_COMMUNITY): Payer: Self-pay | Admitting: Emergency Medicine

## 2017-04-17 ENCOUNTER — Emergency Department (HOSPITAL_COMMUNITY)
Admission: EM | Admit: 2017-04-17 | Discharge: 2017-04-17 | Disposition: A | Discharge status: Home / Self Care | Payer: Medicare Other | Attending: Emergency Medicine | Admitting: Emergency Medicine

## 2017-04-17 DIAGNOSIS — Z5321 Procedure and treatment not carried out due to patient leaving prior to being seen by health care provider: Secondary | ICD-10-CM | POA: Insufficient documentation

## 2017-04-17 DIAGNOSIS — R2243 Localized swelling, mass and lump, lower limb, bilateral: Secondary | ICD-10-CM | POA: Insufficient documentation

## 2017-04-17 NOTE — ED Notes (Signed)
Bed: [facility]5 Expected date:  Expected time:  Means of arrival:  Comments: 

## 2017-04-17 NOTE — ED Notes (Signed)
Pt told triage nurse he was leaving and walked out into lobby.

## 2017-04-17 NOTE — ED Triage Notes (Signed)
Per [facility], patient from [employer], c/o swelling to bilateral feet. Ambulatory. Reports he is homeless. Patient cussing at staff in triage.

## 2017-04-18 DIAGNOSIS — Z590 Homelessness: Secondary | ICD-10-CM | POA: Insufficient documentation

## 2017-04-18 DIAGNOSIS — I10 Essential (primary) hypertension: Secondary | ICD-10-CM | POA: Insufficient documentation

## 2017-04-18 DIAGNOSIS — F17210 Nicotine dependence, cigarettes, uncomplicated: Secondary | ICD-10-CM | POA: Insufficient documentation

## 2017-04-18 DIAGNOSIS — F1910 Other psychoactive substance abuse, uncomplicated: Secondary | ICD-10-CM | POA: Insufficient documentation

## 2017-04-18 DIAGNOSIS — Z599 Problem related to housing and economic circumstances, unspecified: Secondary | ICD-10-CM | POA: Diagnosis present

## 2017-04-19 ENCOUNTER — Emergency Department (HOSPITAL_COMMUNITY)
Admission: EM | Admit: 2017-04-19 | Discharge: 2017-04-19 | Disposition: A | Discharge status: Home / Self Care | Payer: Medicare Other | Attending: Emergency Medicine | Admitting: Emergency Medicine

## 2017-04-19 ENCOUNTER — Encounter (HOSPITAL_COMMUNITY): Payer: Self-pay | Admitting: Emergency Medicine

## 2017-04-19 DIAGNOSIS — F1910 Other psychoactive substance abuse, uncomplicated: Secondary | ICD-10-CM

## 2017-04-19 DIAGNOSIS — Z5900 Homelessness unspecified: Secondary | ICD-10-CM

## 2017-04-19 NOTE — ED Triage Notes (Signed)
Patient is complaining that he has no where to go and wants to be put in a facility. Patient is able to walk, talk, and take care of himself.

## 2017-04-19 NOTE — ED Notes (Signed)
Patient requested socks. Tech gave patient hospital socks.

## 2017-04-19 NOTE — Discharge Instructions (Signed)
To find a primary care or specialty doctor please call [phone] or [phone] to access "[facility] Find a Doctor Service." ° °You may also go on the [facility] website at [email or web address] ° °There are also multiple [facility], [facility], [facility] and [facility] practices throughout the [place] that are frequently accepting new patients. You may find a clinic that is close to your home and contact them. ° °[facility] -  °[address] °[place] [address] °[phone] ° ° °[facility] -  °[address] °[place] [address] °[phone] ° ° °[facility] - °[address]  °[place] [address] °[phone] ° ° °

## 2017-04-19 NOTE — ED Notes (Signed)
Pt called for triage, no responce 

## 2017-04-19 NOTE — ED Provider Notes (Signed)
TIME SEEN: 2:09 AM  CHIEF COMPLAINT: requesting facilities for detox  HPI: Pt is a 46 year old male with history of hypertension, schizophrenia who is well-known for emergency department with 26 visits in the past 6 months he presents today requesting information for placement in a detox facility. He denies SI, HI or hallucinations. Reports he has been drinking alcohol and using drugs. Denies any new medical complaints. No pain, fever, cough, vomiting, diarrhea.  No seizures or other signs of life-threatening withdrawal. Last used drugs and alcohol prior to arrival.  ROS: See HPI Constitutional: no fever  Eyes: no drainage  ENT: no runny nose   Cardiovascular:  no chest pain  Resp: no SOB  GI: no vomiting GU: no dysuria Integumentary: no rash  Allergy: no hives  Musculoskeletal: no leg swelling  Neurological: no slurred speech ROS otherwise negative  PAST MEDICAL HISTORY/PAST SURGICAL HISTORY:  Past Medical History:  Diagnosis Date  . Bipolar depression (HCC)   . Gout   . Hypertension   . Pneumonia    had aspiration pneumonia 5/14-on vent  . Schizophrenia (HCC)     MEDICATIONS:  Prior to Admission medications   Medication Sig Start Date End Date Taking? Authorizing Provider  albuterol (PROVENTIL HFA;VENTOLIN HFA) 108 (90 Base) MCG/ACT inhaler Inhale 1-2 puffs into the lungs every 6 (six) hours as needed for wheezing or shortness of breath. Patient not taking: Reported on 03/04/2017 12/22/16   Arby Barrette, MD  amLODipine (NORVASC) 10 MG tablet Take 1 tablet (10 mg total) by mouth daily. Patient not taking: Reported on 03/04/2017 01/21/17   Lorre Nick, MD  carbamazepine (TEGRETOL) 200 MG tablet Take 1 tablet (200 mg total) by mouth 2 (two) times daily. Patient not taking: Reported on 11/25/2016 09/02/16   Charlynne Pander, MD  doxycycline (VIBRAMYCIN) 100 MG capsule Take 1 capsule (100 mg total) by mouth 2 (two) times daily. Patient not taking: Reported on 04/13/2017 03/17/17    Lavera Guise, MD  furosemide (LASIX) 20 MG tablet Take 1 tablet (20 mg total) by mouth daily. Patient not taking: Reported on 03/04/2017 01/21/17   Lorre Nick, MD  furosemide (LASIX) 20 MG tablet Take 1 tablet (20 mg total) by mouth daily. Patient not taking: Reported on 04/15/2017 03/17/17   Lavera Guise, MD  pantoprazole (PROTONIX) 20 MG tablet Take 1 tablet (20 mg total) by mouth daily. Patient not taking: Reported on 03/04/2017 12/22/16   Arby Barrette, MD  potassium chloride (K-DUR) 10 MEQ tablet Take 1 tablet (10 mEq total) by mouth daily. Patient not taking: Reported on 03/04/2017 01/21/17   Lorre Nick, MD  QUEtiapine (SEROQUEL) 50 MG tablet Take 1 tablet (50 mg total) by mouth 3 (three) times daily. Patient not taking: Reported on 03/04/2017 12/22/16   Arby Barrette, MD    ALLERGIES:  Allergies  Allergen Reactions  . Tomato Other (See Comments)    unknown    SOCIAL HISTORY:  Social History  Substance Use Topics  . Smoking status: Current Every Day Smoker    Types: Cigarettes  . Smokeless tobacco: Never Used  . Alcohol use Yes     Comment: Unknown last drink.     FAMILY HISTORY: Family History  Problem Relation Age of Onset  . Family history unknown: Yes    EXAM: BP 134/73 (BP Location: Right Arm)   Pulse 78   Temp 98.9 F (37.2 C) (Oral)   Resp 18   Ht  (1.727 m)   Wt 117 kg (258  lb)   SpO2 98%   BMI 39.23 kg/m  CONSTITUTIONAL: Alert and oriented and responds appropriately to questions. Morbidly obese,chronically ill-appearing, in no distress HEAD: Normocephalic EYES: Conjunctivae clear, pupils appear equal, EOMI ENT: normal nose; moist mucous membranes NECK: Supple, no meningismus, no nuchal rigidity, no LAD  CARD: RRR; S1 and S2 appreciated; no murmurs, no clicks, no rubs, no gallops RESP: Normal chest excursion without splinting or tachypnea; breath sounds clear and equal bilaterally; no wheezes, no rhonchi, no rales, no hypoxia or respiratory  distress, speaking full sentences ABD/GI: Normal bowel sounds; non-distended; soft, non-tender, no rebound, no guarding, no peritoneal signs, no hepatosplenomegaly BACK:  The back appears normal and is non-tender to palpation, there is no CVA tenderness EXT: Normal ROM in all joints; non-tender to palpation; bilateral chronic lower extremity edema; normal capillary refill; no cyanosis, no calf tenderness or swelling    SKIN: Normal color for age and race; warm; no rash NEURO: Moves all extremities equally PSYCH: poor grooming. No SI or HI. No hallucinations.  MEDICAL DECISION MAKING: Patient here requesting information for detox. He does not meet any criteria for hospitalization. No psychiatric safety concerns at this time. Given him outpatient resources. I feel he is safe to be discharged without further emergent workup. Patient has no other acute complaints.  At this time, I do not feel there is any life-threatening condition present. I have reviewed and discussed all results (EKG, imaging, lab, urine as appropriate) and exam findings with patient/family. I have reviewed nursing notes and appropriate previous records.  I feel the patient is safe to be discharged home without further emergent workup and can continue workup as an outpatient as needed. Discussed usual and customary return precautions. Patient/family verbalize understanding and are comfortable with this plan.  Outpatient follow-up has been provided if needed. All questions have been answered.      Tieler Cournoyer, Layla Maw, DO 04/19/17 [phone]

## 2017-04-20 ENCOUNTER — Emergency Department (HOSPITAL_COMMUNITY): Payer: Medicare Other

## 2017-04-20 ENCOUNTER — Encounter (HOSPITAL_COMMUNITY): Payer: Self-pay | Admitting: Emergency Medicine

## 2017-04-20 ENCOUNTER — Emergency Department (HOSPITAL_COMMUNITY)
Admission: EM | Admit: 2017-04-20 | Discharge: 2017-04-20 | Disposition: A | Discharge status: Home / Self Care | Payer: Medicare Other | Attending: Emergency Medicine | Admitting: Emergency Medicine

## 2017-04-20 DIAGNOSIS — Z79899 Other long term (current) drug therapy: Secondary | ICD-10-CM | POA: Diagnosis not present

## 2017-04-20 DIAGNOSIS — M79671 Pain in right foot: Secondary | ICD-10-CM | POA: Diagnosis present

## 2017-04-20 DIAGNOSIS — F17210 Nicotine dependence, cigarettes, uncomplicated: Secondary | ICD-10-CM | POA: Insufficient documentation

## 2017-04-20 DIAGNOSIS — I10 Essential (primary) hypertension: Secondary | ICD-10-CM | POA: Diagnosis not present

## 2017-04-20 DIAGNOSIS — L03115 Cellulitis of right lower limb: Secondary | ICD-10-CM

## 2017-04-20 MED ORDER — CEPHALEXIN 500 MG PO CAPS
500.0000 mg | ORAL_CAPSULE | Freq: Once | ORAL | Status: AC
  Administered 2017-04-20: 500 mg via ORAL
  Filled 2017-04-20: qty 1

## 2017-04-20 MED ORDER — IBUPROFEN 800 MG PO TABS
800.0000 mg | ORAL_TABLET | Freq: Once | ORAL | Status: AC
  Administered 2017-04-20: 800 mg via ORAL
  Filled 2017-04-20: qty 1

## 2017-04-20 MED ORDER — NAPROXEN 500 MG PO TABS: 500.0000 mg | ORAL_TABLET | Freq: Two times a day (BID) | ORAL | 0 refills | Status: DC

## 2017-04-20 MED ORDER — CEPHALEXIN 500 MG PO CAPS: 500.0000 mg | ORAL_CAPSULE | Freq: Four times a day (QID) | ORAL | 0 refills | Status: DC

## 2017-04-20 NOTE — ED Triage Notes (Signed)
Pt complaint of right leg pain for 2 months.

## 2017-04-20 NOTE — Discharge Instructions (Signed)
Keep your feet clean and dry. Follow-up with doctor [facility] as needed.  Keflex for infection.  Naproxen for pain.
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
## 2017-04-22 ENCOUNTER — Emergency Department (HOSPITAL_COMMUNITY)
Admission: EM | Admit: 2017-04-22 | Discharge: 2017-04-23 | Disposition: A | Discharge status: Home / Self Care | Payer: Medicare Other | Attending: Emergency Medicine | Admitting: Emergency Medicine

## 2017-04-22 DIAGNOSIS — F1911 Other psychoactive substance abuse, in remission: Secondary | ICD-10-CM

## 2017-04-22 DIAGNOSIS — R001 Bradycardia, unspecified: Secondary | ICD-10-CM | POA: Diagnosis present

## 2017-04-22 DIAGNOSIS — Z87898 Personal history of other specified conditions: Secondary | ICD-10-CM | POA: Diagnosis not present

## 2017-04-22 DIAGNOSIS — F17210 Nicotine dependence, cigarettes, uncomplicated: Secondary | ICD-10-CM | POA: Diagnosis not present

## 2017-04-22 DIAGNOSIS — R400 Somnolence: Secondary | ICD-10-CM

## 2017-04-22 DIAGNOSIS — Z791 Long term (current) use of non-steroidal anti-inflammatories (NSAID): Secondary | ICD-10-CM | POA: Diagnosis not present

## 2017-04-22 DIAGNOSIS — I10 Essential (primary) hypertension: Secondary | ICD-10-CM | POA: Diagnosis not present

## 2017-04-22 MED ORDER — AMMONIA AROMATIC IN INHA
RESPIRATORY_TRACT | Status: AC
  Administered 2017-04-23: 02:00:00
  Filled 2017-04-22: qty 10

## 2017-04-22 NOTE — ED Notes (Signed)
Bed: RESB Expected date:  Expected time:  Means of arrival:  Comments: 46 m overdose

## 2017-04-22 NOTE — ED Notes (Addendum)
Pt is able to be aroused with painful stimuli and falls back to sleep. Does not stay awake long enough to answer questions.   **Secondary assessment for cognitive processes

## 2017-04-22 NOTE — ED Triage Notes (Signed)
BIB [facility] from family members house. EMS was called out for bradycardia. Once arrived the pt was c/o HTN and was concerned and wanted transport. He admitted to using cocaine yesterday denies all other substances. Pupils pinpoint on arrival, pt is sleeping and maintaining own airway and is not hypertensive or bradycardic upon arrival. Pt is easily aroused from sleep. Pt on 2l [place] sat is 96% was 93% on RA.
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
## 2017-04-23 ENCOUNTER — Emergency Department (HOSPITAL_COMMUNITY)
Admission: EM | Admit: 2017-04-23 | Discharge: 2017-04-23 | Discharge status: Against Medical Advice | Payer: Medicare Other | Source: Home / Self Care | Attending: Emergency Medicine | Admitting: Emergency Medicine

## 2017-04-23 ENCOUNTER — Encounter (HOSPITAL_COMMUNITY): Payer: Self-pay | Admitting: Emergency Medicine

## 2017-04-23 DIAGNOSIS — Z008 Encounter for other general examination: Secondary | ICD-10-CM

## 2017-04-23 DIAGNOSIS — Z5321 Procedure and treatment not carried out due to patient leaving prior to being seen by health care provider: Secondary | ICD-10-CM

## 2017-04-23 LAB — I-STAT CHEM 8, ED
BUN: 9 mg/dL (ref 6–20)
CALCIUM ION: 1.18 mmol/L (ref 1.15–1.40)
Chloride: 99 mmol/L — ABNORMAL LOW (ref 101–111)
Creatinine, Ser: 0.8 mg/dL (ref 0.61–1.24)
GLUCOSE: 103 mg/dL — AB (ref 65–99)
HCT: 39 % (ref 39.0–52.0)
HEMOGLOBIN: 13.3 g/dL (ref 13.0–17.0)
Potassium: 4.2 mmol/L (ref 3.5–5.1)
SODIUM: 136 mmol/L (ref 135–145)
TCO2: 30 mmol/L (ref 22–32)

## 2017-04-23 NOTE — ED Notes (Signed)
Bed: [facility] Expected date:  Expected time:  Means of arrival:  Comments: 

## 2017-04-23 NOTE — Discharge Instructions (Signed)
FOLLOW UP WITH YOUR DOCTOR FOR FURTHER MEDICAL CONCERNS.

## 2017-04-23 NOTE — ED Triage Notes (Signed)
Pt states that he wants to be admitted to behavioral health for a week. Pt denies any suicidal or homicidal ideation.

## 2017-04-23 NOTE — ED Notes (Signed)
Pt states that he was leaving.

## 2017-04-24 ENCOUNTER — Encounter (HOSPITAL_COMMUNITY): Payer: Self-pay | Admitting: Emergency Medicine

## 2017-04-24 ENCOUNTER — Emergency Department (HOSPITAL_COMMUNITY)
Admission: EM | Admit: 2017-04-24 | Discharge: 2017-04-25 | Disposition: A | Discharge status: Home / Self Care | Payer: Medicare Other | Source: Home / Self Care | Attending: Emergency Medicine | Admitting: Emergency Medicine

## 2017-04-24 ENCOUNTER — Emergency Department (HOSPITAL_COMMUNITY)
Admission: EM | Admit: 2017-04-24 | Discharge: 2017-04-24 | Disposition: A | Discharge status: Home / Self Care | Payer: Medicare Other | Source: Home / Self Care | Attending: Emergency Medicine | Admitting: Emergency Medicine

## 2017-04-24 ENCOUNTER — Emergency Department (HOSPITAL_COMMUNITY): Payer: Medicare Other

## 2017-04-24 DIAGNOSIS — L03115 Cellulitis of right lower limb: Secondary | ICD-10-CM | POA: Diagnosis not present

## 2017-04-24 DIAGNOSIS — R4781 Slurred speech: Secondary | ICD-10-CM | POA: Insufficient documentation

## 2017-04-24 DIAGNOSIS — Z79899 Other long term (current) drug therapy: Secondary | ICD-10-CM

## 2017-04-24 DIAGNOSIS — A419 Sepsis, unspecified organism: Secondary | ICD-10-CM | POA: Diagnosis not present

## 2017-04-24 DIAGNOSIS — F1410 Cocaine abuse, uncomplicated: Secondary | ICD-10-CM | POA: Diagnosis present

## 2017-04-24 DIAGNOSIS — Z5321 Procedure and treatment not carried out due to patient leaving prior to being seen by health care provider: Secondary | ICD-10-CM | POA: Insufficient documentation

## 2017-04-24 DIAGNOSIS — M109 Gout, unspecified: Secondary | ICD-10-CM | POA: Insufficient documentation

## 2017-04-24 DIAGNOSIS — F17210 Nicotine dependence, cigarettes, uncomplicated: Secondary | ICD-10-CM | POA: Diagnosis present

## 2017-04-24 DIAGNOSIS — G9341 Metabolic encephalopathy: Secondary | ICD-10-CM | POA: Diagnosis not present

## 2017-04-24 DIAGNOSIS — R0602 Shortness of breath: Secondary | ICD-10-CM

## 2017-04-24 DIAGNOSIS — F1210 Cannabis abuse, uncomplicated: Secondary | ICD-10-CM | POA: Diagnosis present

## 2017-04-24 DIAGNOSIS — R609 Edema, unspecified: Secondary | ICD-10-CM

## 2017-04-24 DIAGNOSIS — I10 Essential (primary) hypertension: Secondary | ICD-10-CM | POA: Diagnosis present

## 2017-04-24 LAB — I-STAT TROPONIN, ED: TROPONIN I, POC: 0 ng/mL (ref 0.00–0.08)

## 2017-04-24 LAB — COMPREHENSIVE METABOLIC PANEL
ALT: 17 U/L (ref 17–63)
ANION GAP: 13 (ref 5–15)
AST: 22 U/L (ref 15–41)
Albumin: 3.4 g/dL — ABNORMAL LOW (ref 3.5–5.0)
Alkaline Phosphatase: 64 U/L (ref 38–126)
BUN: 11 mg/dL (ref 6–20)
CALCIUM: 8.8 mg/dL — AB (ref 8.9–10.3)
CHLORIDE: 100 mmol/L — AB (ref 101–111)
CO2: 21 mmol/L — AB (ref 22–32)
CREATININE: 0.67 mg/dL (ref 0.61–1.24)
Glucose, Bld: 98 mg/dL (ref 65–99)
Potassium: 3.5 mmol/L (ref 3.5–5.1)
SODIUM: 134 mmol/L — AB (ref 135–145)
Total Bilirubin: 0.2 mg/dL — ABNORMAL LOW (ref 0.3–1.2)
Total Protein: 7 g/dL (ref 6.5–8.1)

## 2017-04-24 LAB — ETHANOL: ALCOHOL ETHYL (B): 45 mg/dL — AB (ref <10)

## 2017-04-24 LAB — BASIC METABOLIC PANEL
Anion gap: 8 (ref 5–15)
BUN: 15 mg/dL (ref 6–20)
CALCIUM: 9 mg/dL (ref 8.9–10.3)
CO2: 28 mmol/L (ref 22–32)
CREATININE: 0.9 mg/dL (ref 0.61–1.24)
Chloride: 99 mmol/L — ABNORMAL LOW (ref 101–111)
GFR calc Af Amer: >60 mL/min (ref >60)
Glucose, Bld: 80 mg/dL (ref 65–99)
Potassium: 3.9 mmol/L (ref 3.5–5.1)
SODIUM: 135 mmol/L (ref 135–145)

## 2017-04-24 LAB — CBC
HCT: 41 % (ref 39.0–52.0)
Hemoglobin: 12.8 g/dL — ABNORMAL LOW (ref 13.0–17.0)
MCH: 26.5 pg (ref 26.0–34.0)
MCHC: 31.2 g/dL (ref 30.0–36.0)
MCV: 84.9 fL (ref 78.0–100.0)
PLATELETS: 180 10*3/uL (ref 150–400)
RBC: 4.83 MIL/uL (ref 4.22–5.81)
RDW: 15.6 % — AB (ref 11.5–15.5)
WBC: 9.4 10*3/uL (ref 4.0–10.5)

## 2017-04-24 LAB — CBC WITH DIFFERENTIAL/PLATELET
BASOS PCT: 1 %
Basophils Absolute: 0.1 10*3/uL (ref 0.0–0.1)
Eosinophils Absolute: 0.2 10*3/uL (ref 0.0–0.7)
Eosinophils Relative: 3 %
HEMATOCRIT: 38.4 % — AB (ref 39.0–52.0)
HEMOGLOBIN: 12.2 g/dL — AB (ref 13.0–17.0)
LYMPHS ABS: 1.7 10*3/uL (ref 0.7–4.0)
Lymphocytes Relative: 24 %
MCH: 26.2 pg (ref 26.0–34.0)
MCHC: 31.8 g/dL (ref 30.0–36.0)
MCV: 82.4 fL (ref 78.0–100.0)
MONO ABS: 0.6 10*3/uL (ref 0.1–1.0)
MONOS PCT: 8 %
NEUTROS ABS: 4.8 10*3/uL (ref 1.7–7.7)
NEUTROS PCT: 64 %
Platelets: 176 10*3/uL (ref 150–400)
RBC: 4.66 MIL/uL (ref 4.22–5.81)
RDW: 15.8 % — AB (ref 11.5–15.5)
WBC: 7.4 10*3/uL (ref 4.0–10.5)

## 2017-04-24 LAB — RAPID URINE DRUG SCREEN, HOSP PERFORMED
AMPHETAMINES: NOT DETECTED
Barbiturates: NOT DETECTED
Benzodiazepines: NOT DETECTED
COCAINE: POSITIVE — AB
OPIATES: NOT DETECTED
TETRAHYDROCANNABINOL: POSITIVE — AB

## 2017-04-24 LAB — BRAIN NATRIURETIC PEPTIDE: B NATRIURETIC PEPTIDE 5: 25.6 pg/mL (ref 0.0–100.0)

## 2017-04-24 MED ORDER — FUROSEMIDE 20 MG PO TABS
20.0000 mg | ORAL_TABLET | Freq: Once | ORAL | Status: AC
  Administered 2017-04-24: 20 mg via ORAL
  Filled 2017-04-24: qty 1

## 2017-04-24 NOTE — ED Triage Notes (Signed)
Pt to triage via [facility]>C/o sob and bilateral lower extremity swelling/pain x 1 month.  SOB worse tonight while smoking.  Per EMS pt slept all the way to the ED.  Pt currently falling asleep/snoring during triage assessment and EKG.

## 2017-04-24 NOTE — ED Triage Notes (Signed)
Pt to ED via [facility] st's he wants rehab for crack.  Pt admits to 2 beers today.  Speech slurred, falling asleep and snoring while being triaged.  Pt's pupils size 1 or 2 bil.

## 2017-04-24 NOTE — ED Notes (Signed)
Patient transported to X-ray 

## 2017-04-24 NOTE — ED Notes (Signed)
Pt ambulatory to bed with steady gait. Pt states "I've been having trouble breathing." Pt respirations even and unlabored. 100% on RA. Pt also states "I have gout." Pt lethargic and begins snoring and sleeping soundly within minutes.

## 2017-04-24 NOTE — ED Notes (Signed)
Per main lab, will add on BNP to existing labs

## 2017-04-24 NOTE — ED Notes (Signed)
ED Provider at bedside. 

## 2017-04-24 NOTE — ED Provider Notes (Signed)
[facility]-EMERGENCY DEPT Provider Note   CSN: [id number] Arrival date & time: 04/24/17  0152     History   Chief Complaint Chief Complaint  Patient presents with  . Shortness of Breath  . Leg Swelling    HPI [name] [name] is a 46 y.o. male.  Patient presents to the ER for multiple complaints. Patient reports that he has noticed that his legs are swelling, both of his feet and ankles are hurting. He thinks it might be gout, as he has had this previously with similar symptoms. He has noticed some swelling of his legs. He reports that he has been experiencing shortness of breath.      Past Medical History:  Diagnosis Date  . Bipolar depression (HCC)   . Gout   . Hypertension   . Pneumonia    had aspiration pneumonia 5/14-on vent  . Schizophrenia [facility])     Patient Active Problem List   Diagnosis Date Noted  . Somnolence 01/09/2017  . Paranoid schizophrenia (HCC) 04/20/2013  . Acute respiratory failure with hypercapnia (HCC) 11/17/2012  . Encephalopathy acute 11/17/2012    Past Surgical History:  Procedure Laterality Date  . arm surgery     hurt his arm 25 hr ago  . OPEN REDUCTION INTERNAL FIXATION (ORIF) PROXIMAL PHALANX Right 04/02/2013   Procedure: OPEN REDUCTION INTERNAL FIXATION (ORIF) RIGHT SMALL FINGER;  Surgeon: Tami Ribas, MD;  Location: [facility];  Service: Orthopedics;  Laterality: Right;       Home Medications    Prior to Admission medications   Medication Sig Start Date End Date Taking? Authorizing Provider  albuterol (PROVENTIL HFA;VENTOLIN HFA) 108 (90 Base) MCG/ACT inhaler Inhale 1-2 puffs into the lungs every 6 (six) hours as needed for wheezing or shortness of breath. Patient not taking: Reported on 03/04/2017 12/22/16   Arby Barrette, MD  amLODipine (NORVASC) 10 MG tablet Take 1 tablet (10 mg total) by mouth daily. Patient not taking: Reported on 03/04/2017 01/21/17   Lorre Nick, MD  carbamazepine (TEGRETOL) 200 MG tablet Take  1 tablet (200 mg total) by mouth 2 (two) times daily. Patient not taking: Reported on 11/25/2016 09/02/16   Charlynne Pander, MD  cephALEXin (KEFLEX) 500 MG capsule Take 1 capsule (500 mg total) by mouth 4 (four) times daily. Patient not taking: Reported on 04/23/2017 04/20/17   Rolland Porter, MD  doxycycline (VIBRAMYCIN) 100 MG capsule Take 1 capsule (100 mg total) by mouth 2 (two) times daily. Patient not taking: Reported on 04/23/2017 03/17/17   Lavera Guise, MD  furosemide (LASIX) 20 MG tablet Take 1 tablet (20 mg total) by mouth daily. Patient not taking: Reported on 03/04/2017 01/21/17   Lorre Nick, MD  furosemide (LASIX) 20 MG tablet Take 1 tablet (20 mg total) by mouth daily. Patient not taking: Reported on 04/15/2017 03/17/17   Lavera Guise, MD  naproxen (NAPROSYN) 500 MG tablet Take 1 tablet (500 mg total) by mouth 2 (two) times daily. Patient not taking: Reported on 04/23/2017 04/20/17   Rolland Porter, MD  pantoprazole (PROTONIX) 20 MG tablet Take 1 tablet (20 mg total) by mouth daily. Patient not taking: Reported on 03/04/2017 12/22/16   Arby Barrette, MD  potassium chloride (K-DUR) 10 MEQ tablet Take 1 tablet (10 mEq total) by mouth daily. Patient not taking: Reported on 03/04/2017 01/21/17   Lorre Nick, MD  QUEtiapine (SEROQUEL) 50 MG tablet Take 1 tablet (50 mg total) by mouth 3 (three) times daily. Patient not taking: Reported  on 03/04/2017 12/22/16   Arby Barrette, MD    Family History Family History  Problem Relation Age of Onset  . Family history unknown: Yes    Social History Social History  Substance Use Topics  . Smoking status: Current Every Day Smoker    Types: Cigarettes  . Smokeless tobacco: Never Used  . Alcohol use Yes     Comment: Unknown last drink.      Allergies   Tomato   Review of Systems Review of Systems  Respiratory: Positive for shortness of breath.   Cardiovascular: Positive for leg swelling.  Musculoskeletal: Positive for arthralgias.    All other systems reviewed and are negative.    Physical Exam Updated Vital Signs BP 137/81   Pulse 81   Temp 98.3 F (36.8 C) (Oral)   Resp (!) 22   SpO2 97%   Physical Exam  Constitutional: He is oriented to person, place, and time. He appears well-developed and well-nourished. No distress.  HENT:  Head: Normocephalic and atraumatic.  Right Ear: Hearing normal.  Left Ear: Hearing normal.  Nose: Nose normal.  Mouth/Throat: Oropharynx is clear and moist and mucous membranes are normal.  Eyes: Pupils are equal, round, and reactive to light. Conjunctivae and EOM are normal.  Neck: Normal range of motion. Neck supple.  Cardiovascular: Regular rhythm, S1 normal and S2 normal.  Exam reveals no gallop and no friction rub.   No murmur heard. Pulmonary/Chest: Effort normal and breath sounds normal. No respiratory distress. He exhibits no tenderness.  Abdominal: Soft. Normal appearance and bowel sounds are normal. There is no hepatosplenomegaly. There is no tenderness. There is no rebound, no guarding, no tenderness at McBurney's point and negative Murphy's sign. No hernia.  Musculoskeletal: Normal range of motion. He exhibits edema.  Neurological: He is alert and oriented to person, place, and time. He has normal strength. No cranial nerve deficit or sensory deficit. Coordination normal. GCS eye subscore is 4. GCS verbal subscore is 5. GCS motor subscore is 6.  Skin: Skin is warm, dry and intact. No rash noted. No cyanosis.  Psychiatric: He has a normal mood and affect. His speech is normal and behavior is normal. Thought content normal.  Nursing note and vitals reviewed.    ED Treatments / Results  Labs (all labs ordered are listed, but only abnormal results are displayed) Labs Reviewed  BASIC METABOLIC PANEL - Abnormal; Notable for the following:       Result Value   Chloride 99 (*)    All other components within normal limits  CBC - Abnormal; Notable for the following:     Hemoglobin 12.8 (*)    RDW 15.6 (*)    All other components within normal limits  BRAIN NATRIURETIC PEPTIDE  I-STAT TROPONIN, ED    EKG  EKG Interpretation  Date/Time:  Monday April 24 2017 01:53:08 EDT Ventricular Rate:  80 PR Interval:  120 QRS Duration: 82 QT Interval:  344 QTC Calculation: 396 R Axis:   50 Text Interpretation:  Normal sinus rhythm with sinus arrhythmia Septal infarct , age undetermined Abnormal ECG No significant change since last tracing Confirmed by Gilda Crease [phone]) on 04/24/2017 2:55:18 AM       Radiology Dg Chest 1 View  Result Date: 04/24/2017 CLINICAL DATA:  Shortness of breath with bilateral lower extremity swelling and pain for 1 month. EXAM: CHEST 1 VIEW COMPARISON:  02/19/2017 FINDINGS: Shallow inspiration with elevation of the right hemidiaphragm. Heart size and pulmonary vascularity are  normal for technique. No airspace disease or consolidation in the lungs. No blunting of costophrenic angles. No pneumothorax. IMPRESSION: Shallow inspiration.  No evidence of active pulmonary disease. Electronically Signed   By: Burman Nieves M.D.   On: 04/24/2017 02:55    Procedures Procedures (including critical care time)  Medications Ordered in ED Medications - No data to display   Initial Impression / Assessment and Plan / ED Course  I have reviewed the triage vital signs and the nursing notes.  Pertinent labs & imaging results that were available during my care of the patient were reviewed by me and considered in my medical decision making (see chart for details).     Patient presents to the emergency department for evaluation of multiple problems. Patient well-known to the emergency department. He presents to the ER frequently, has been seen 30 times in the last 6 months. He has been seen frequently with similar complaints. Patient frequently complains of pain and swelling of his legs, has a history of peripheral edema. His  examination today does not reveal any isolated joint pathology. No concern for septic joint.No overlying warmth to suggest cellulitis.patient reports that he is feeling short of breath, lungs are clear to auscultation. Oxygen saturation 97%. He does not have any pathology that would be concerning for PE. He does not have any indication of congestive heart failure. This is a typical presentation for the patient.  Final Clinical Impressions(s) / ED Diagnoses   Final diagnoses:  Peripheral edema    New Prescriptions New Prescriptions   No medications on file     Gilda Crease, MD 04/24/17 [phone]

## 2017-04-25 NOTE — ED Notes (Signed)
Pt ambulating in triage.  States he does not want to be seen.  States he wants to be discharged.  This RN encouraged him to go back to see the doctor, that they were ready to see him and had a bed ready.  Patient states he just wants to leave.  Will d/c.

## 2017-04-27 ENCOUNTER — Encounter (HOSPITAL_COMMUNITY): Payer: Self-pay | Admitting: Emergency Medicine

## 2017-04-27 NOTE — ED Triage Notes (Signed)
Pt brought in by EMS for c/o abd pain  Denies N/V  Pt states he has been walking all day  [facility]  Pt states he has been eating and drinking without problems

## 2017-04-27 NOTE — ED Triage Notes (Signed)
Pt is also c/o productive cough

## 2017-04-28 ENCOUNTER — Inpatient Hospital Stay (HOSPITAL_COMMUNITY)
Admission: EM | Admit: 2017-04-28 | Discharge: 2017-04-28 | DRG: 871 | Discharge status: Against Medical Advice | Payer: Medicare Other | Attending: Internal Medicine | Admitting: Internal Medicine

## 2017-04-28 ENCOUNTER — Inpatient Hospital Stay (HOSPITAL_COMMUNITY): Admit: 2017-04-28 | Payer: Self-pay

## 2017-04-28 ENCOUNTER — Emergency Department (HOSPITAL_COMMUNITY): Payer: Medicare Other

## 2017-04-28 DIAGNOSIS — A419 Sepsis, unspecified organism: Principal | ICD-10-CM

## 2017-04-28 DIAGNOSIS — F1210 Cannabis abuse, uncomplicated: Secondary | ICD-10-CM | POA: Diagnosis not present

## 2017-04-28 DIAGNOSIS — F1910 Other psychoactive substance abuse, uncomplicated: Secondary | ICD-10-CM | POA: Diagnosis not present

## 2017-04-28 DIAGNOSIS — I10 Essential (primary) hypertension: Secondary | ICD-10-CM | POA: Diagnosis present

## 2017-04-28 DIAGNOSIS — G9341 Metabolic encephalopathy: Secondary | ICD-10-CM | POA: Diagnosis not present

## 2017-04-28 DIAGNOSIS — L03115 Cellulitis of right lower limb: Secondary | ICD-10-CM

## 2017-04-28 DIAGNOSIS — F1410 Cocaine abuse, uncomplicated: Secondary | ICD-10-CM | POA: Diagnosis not present

## 2017-04-28 DIAGNOSIS — Z720 Tobacco use: Secondary | ICD-10-CM | POA: Insufficient documentation

## 2017-04-28 DIAGNOSIS — F17210 Nicotine dependence, cigarettes, uncomplicated: Secondary | ICD-10-CM | POA: Diagnosis not present

## 2017-04-28 LAB — BASIC METABOLIC PANEL
Anion gap: 8 (ref 5–15)
BUN: 8 mg/dL (ref 6–20)
CALCIUM: 8 mg/dL — AB (ref 8.9–10.3)
CHLORIDE: 99 mmol/L — AB (ref 101–111)
CO2: 25 mmol/L (ref 22–32)
CREATININE: 0.79 mg/dL (ref 0.61–1.24)
GFR calc non Af Amer: >60 mL/min (ref >60)
Glucose, Bld: 106 mg/dL — ABNORMAL HIGH (ref 65–99)
Potassium: 3.9 mmol/L (ref 3.5–5.1)
SODIUM: 132 mmol/L — AB (ref 135–145)

## 2017-04-28 LAB — COMPREHENSIVE METABOLIC PANEL
ALK PHOS: 67 U/L (ref 38–126)
ALT: 21 U/L (ref 17–63)
ANION GAP: 12 (ref 5–15)
AST: 30 U/L (ref 15–41)
Albumin: 3.8 g/dL (ref 3.5–5.0)
BILIRUBIN TOTAL: 0.9 mg/dL (ref 0.3–1.2)
BUN: 9 mg/dL (ref 6–20)
CALCIUM: 9.3 mg/dL (ref 8.9–10.3)
CO2: 27 mmol/L (ref 22–32)
Chloride: 92 mmol/L — ABNORMAL LOW (ref 101–111)
Creatinine, Ser: 0.83 mg/dL (ref 0.61–1.24)
GLUCOSE: 119 mg/dL — AB (ref 65–99)
Potassium: 4.2 mmol/L (ref 3.5–5.1)
Sodium: 131 mmol/L — ABNORMAL LOW (ref 135–145)
Total Protein: 8.2 g/dL — ABNORMAL HIGH (ref 6.5–8.1)

## 2017-04-28 LAB — URINALYSIS, ROUTINE W REFLEX MICROSCOPIC
Bacteria, UA: NONE SEEN
Bilirubin Urine: NEGATIVE
Glucose, UA: NEGATIVE mg/dL
Ketones, ur: NEGATIVE mg/dL
Leukocytes, UA: NEGATIVE
NITRITE: NEGATIVE
PROTEIN: 100 mg/dL — AB
Specific Gravity, Urine: 1.016 (ref 1.005–1.030)
Squamous Epithelial / LPF: NONE SEEN
pH: 7 (ref 5.0–8.0)

## 2017-04-28 LAB — PROCALCITONIN: Procalcitonin: 1.18 ng/mL

## 2017-04-28 LAB — LACTIC ACID, PLASMA
Lactic Acid, Venous: 1.8 mmol/L (ref 0.5–1.9)
Lactic Acid, Venous: 1.6 mmol/L (ref 0.5–1.9)

## 2017-04-28 LAB — TROPONIN I: Troponin I: <0.03 ng/mL (ref <0.03)

## 2017-04-28 LAB — CBC
HCT: 41.5 % (ref 39.0–52.0)
HCT: 37.5 % — ABNORMAL LOW (ref 39.0–52.0)
HEMOGLOBIN: 13.8 g/dL (ref 13.0–17.0)
HEMOGLOBIN: 12.2 g/dL — AB (ref 13.0–17.0)
MCH: 27.6 pg (ref 26.0–34.0)
MCH: 26.9 pg (ref 26.0–34.0)
MCHC: 33.3 g/dL (ref 30.0–36.0)
MCHC: 32.5 g/dL (ref 30.0–36.0)
MCV: 83 fL (ref 78.0–100.0)
MCV: 82.8 fL (ref 78.0–100.0)
PLATELETS: 156 10*3/uL (ref 150–400)
Platelets: 181 10*3/uL (ref 150–400)
RBC: 5 MIL/uL (ref 4.22–5.81)
RBC: 4.53 MIL/uL (ref 4.22–5.81)
RDW: 15.6 % — ABNORMAL HIGH (ref 11.5–15.5)
RDW: 15.5 % (ref 11.5–15.5)
WBC: 22.8 10*3/uL — ABNORMAL HIGH (ref 4.0–10.5)
WBC: 24.1 10*3/uL — AB (ref 4.0–10.5)

## 2017-04-28 LAB — LIPASE, BLOOD: Lipase: 21 U/L (ref 11–51)

## 2017-04-28 LAB — PROTIME-INR
INR: 1.13
PROTHROMBIN TIME: 14.4 s (ref 11.4–15.2)

## 2017-04-28 LAB — SEDIMENTATION RATE: Sed Rate: 15 mm/hr (ref 0–16)

## 2017-04-28 MED ORDER — SODIUM CHLORIDE 0.9 % IV BOLUS (SEPSIS): 1000.0000 mL | Freq: Once | INTRAVENOUS | Status: DC

## 2017-04-28 MED ORDER — SODIUM CHLORIDE 0.9 % IV BOLUS (SEPSIS)
1000.0000 mL | Freq: Once | INTRAVENOUS | Status: AC
  Administered 2017-04-28: 1000 mL via INTRAVENOUS

## 2017-04-28 MED ORDER — ZOLPIDEM TARTRATE 5 MG PO TABS: 5.0000 mg | ORAL_TABLET | Freq: Every evening | ORAL | Status: DC | PRN

## 2017-04-28 MED ORDER — NICOTINE 21 MG/24HR TD PT24: 21.0000 mg | MEDICATED_PATCH | Freq: Every day | TRANSDERMAL | Status: DC

## 2017-04-28 MED ORDER — VANCOMYCIN HCL 10 G IV SOLR
2500.0000 mg | Freq: Once | INTRAVENOUS | Status: AC
  Administered 2017-04-28: 2500 mg via INTRAVENOUS
  Filled 2017-04-28: qty 2000

## 2017-04-28 MED ORDER — FAMOTIDINE IN NACL 20-0.9 MG/50ML-% IV SOLN: 20.0000 mg | Freq: Two times a day (BID) | INTRAVENOUS | Status: DC

## 2017-04-28 MED ORDER — VANCOMYCIN HCL IN DEXTROSE 1-5 GM/200ML-% IV SOLN: 1000.0000 mg | Freq: Three times a day (TID) | INTRAVENOUS | Status: DC

## 2017-04-28 MED ORDER — KETOROLAC TROMETHAMINE 30 MG/ML IJ SOLN: 30.0000 mg | Freq: Four times a day (QID) | INTRAMUSCULAR | Status: DC | PRN

## 2017-04-28 MED ORDER — ACETAMINOPHEN 325 MG PO TABS: 650.0000 mg | ORAL_TABLET | Freq: Four times a day (QID) | ORAL | Status: DC | PRN

## 2017-04-28 MED ORDER — SODIUM CHLORIDE 0.9 % IV SOLN
INTRAVENOUS | Status: DC
  Administered 2017-04-28: 05:00:00 via INTRAVENOUS

## 2017-04-28 MED ORDER — ONDANSETRON HCL 4 MG/2ML IJ SOLN: 4.0000 mg | Freq: Three times a day (TID) | INTRAMUSCULAR | Status: DC | PRN

## 2017-04-28 MED ORDER — HYDRALAZINE HCL 20 MG/ML IJ SOLN: 5.0000 mg | INTRAMUSCULAR | Status: DC | PRN

## 2017-04-28 MED ORDER — PIPERACILLIN-TAZOBACTAM 3.375 G IVPB 30 MIN
3.3750 g | Freq: Once | INTRAVENOUS | Status: AC
  Administered 2017-04-28: 3.375 g via INTRAVENOUS
  Filled 2017-04-28: qty 50

## 2017-04-28 MED ORDER — OXYCODONE-ACETAMINOPHEN 5-325 MG PO TABS
1.0000 | ORAL_TABLET | ORAL | Status: DC | PRN
Start: 2017-04-28 — End: 2017-04-28

## 2017-04-28 MED ORDER — PIPERACILLIN-TAZOBACTAM 3.375 G IVPB
3.3750 g | Freq: Three times a day (TID) | INTRAVENOUS | Status: DC
  Filled 2017-04-28: qty 50

## 2017-04-28 MED ORDER — VANCOMYCIN HCL IN DEXTROSE 1-5 GM/200ML-% IV SOLN: 1000.0000 mg | Freq: Once | INTRAVENOUS | Status: DC

## 2017-04-28 MED ORDER — ENOXAPARIN SODIUM 60 MG/0.6ML ~~LOC~~ SOLN: 60.0000 mg | SUBCUTANEOUS | Status: DC

## 2017-04-28 MED ORDER — ACETAMINOPHEN 325 MG PO TABS
650.0000 mg | ORAL_TABLET | Freq: Once | ORAL | Status: AC
  Administered 2017-04-28: 650 mg via ORAL
  Filled 2017-04-28: qty 2

## 2017-04-28 NOTE — Discharge Summary (Signed)
Pt has left AMA. Please see ED nurse note.

## 2017-04-28 NOTE — ED Notes (Signed)
Per patient he is refusing to be admitted. Dr. Sherryll BurgerShah (hospitalist) at bedside. Patient removing bp cuff and leads. IV discontinued and per hospitalist, patient advised of medical need of admission and risk of leaving AMA. Patient A&O x4 and continues stating "I am not staying, you can't make me". Per Dr. Patient is allowed to leave AMA. Patient refused vital signs and further treatment did agree to sign that he is aware of the risk and that he is leaving on his own free will.

## 2017-04-28 NOTE — Progress Notes (Signed)
A consult was received from an ED physician for zosyn and vancomycin per pharmacy dosing.  The patient's profile has been reviewed for ht/wt/allergies/indication/available labs.   A one time order has been placed for Zosyn 3.375 Gm and Vancomycin 2500 mg.  Further antibiotics/pharmacy consults should be ordered by admitting physician if indicated.                       Thank you, Lorenza EvangelistGreen, Akari Defelice R 04/28/2017  2:41 AM

## 2017-04-28 NOTE — ED Notes (Signed)
Went into patient's room to transport to the floor. Patient refused to be transported stating that he is going home. Pt wants IVs taken out and is putting on clothes attempting to leave. Hospitalist Sherryll BurgerShah MD notified that patient wishes to be discharged.

## 2017-04-28 NOTE — ED Notes (Signed)
Report called to floor. Pt ready for transport.

## 2017-04-28 NOTE — Progress Notes (Signed)
Pharmacy Antibiotic Note  [name] [name] is a 46 y.o. male c/o abdominal pain and "my feet hurt" admitted on 04/28/2017 with cellulitis.  Pharmacy has been consulted for zosyn and vancomycin dosing.  Plan: Zosyn 3.375g IV q8h (4 hour infusion).  Vancomycin 2500 mg x1 then 1 Gm IV q8h VT=10-15 mg/L Daily Scr F/u cultures and levels     Temp (24hrs), Avg:102.4 F (39.1 C), Min:100.2 F (37.9 C), Max:104.6 F (40.3 C)   Recent Labs Lab 04/23/17 0222 04/24/17 0207 04/24/17 2037 04/28/17 0103 04/28/17 0240  WBC  --  9.4 7.4 22.8*  --   CREATININE 0.80 0.90 0.67 0.83  --   LATICACIDVEN  --   --   --   --  1.8    Estimated Creatinine Clearance: 139.6 mL/min (by C-G formula based on SCr of 0.83 mg/dL).    Allergies  Allergen Reactions  . Tomato Other (See Comments)    unknown    Antimicrobials this admission: 10/19 zosyn >>  10/19 vancomycin >>   Dose adjustments this admission:   Microbiology results:  BCx:   UCx:    Sputum:    MRSA PCR:   Thank you for allowing pharmacy to be a part of this patient's care.  Lorenza EvangelistGreen, Hazeline Charnley R 04/28/2017 4:17 AM

## 2017-04-28 NOTE — ED Provider Notes (Signed)
[facility]-EMERGENCY DEPT Provider Note   CSN: [id number] Arrival date & time: 04/27/17  1937     History   Chief Complaint Chief Complaint  Patient presents with  . Abdominal Pain    HPI [name] [name] is a 46 y.o. male.  Patient with a history of polysubstance abuse, schizophrenia, HTN presents with complaint of "my feet hurt". He is unable to say how long this has been going on. Chart reviewed and found to show multiple similar complaints in the past. He also complains of abdominal pain with nausea and vomiting. No chest pain, cough, diarrhea or SOB. The patient is somnolent, easily awakened but minimally communicative.    The history is provided by the patient. No language interpreter was used.  Abdominal Pain   Associated symptoms include nausea and vomiting. Pertinent negatives include fever, diarrhea and myalgias.    Past Medical History:  Diagnosis Date  . Bipolar depression (HCC)   . Gout   . Hypertension   . Pneumonia    had aspiration pneumonia 5/14-on vent  . Schizophrenia [facility](HCC)     Patient Active Problem List   Diagnosis Date Noted  . Sepsis (HCC) 04/28/2017  . Cellulitis of leg, right 04/28/2017  . Essential hypertension 04/28/2017  . Tobacco abuse 04/28/2017  . Polysubstance abuse (HCC) 04/28/2017  . Somnolence 01/09/2017  . Paranoid schizophrenia (HCC) 04/20/2013  . Acute respiratory failure with hypercapnia (HCC) 11/17/2012  . Encephalopathy acute 11/17/2012    Past Surgical History:  Procedure Laterality Date  . arm surgery     hurt his arm 25 hr ago  . OPEN REDUCTION INTERNAL FIXATION (ORIF) PROXIMAL PHALANX Right 04/02/2013   Procedure: OPEN REDUCTION INTERNAL FIXATION (ORIF) RIGHT SMALL FINGER;  Surgeon: Tami RibasKevin R Kuzma, MD;  Location: [facility];  Service: Orthopedics;  Laterality: Right;       Home Medications    Prior to Admission medications   Not on File    Family History Family History    Problem Relation Age of Onset  . Family history unknown: Yes    Social History Social History  Substance Use Topics  . Smoking status: Current Every Day Smoker    Types: Cigarettes  . Smokeless tobacco: Never Used  . Alcohol use Yes     Comment: Unknown last drink.      Allergies   Tomato   Review of Systems Review of Systems  Constitutional: Negative for chills and fever.  HENT: Negative.   Respiratory: Negative.   Cardiovascular: Negative.   Gastrointestinal: Positive for abdominal pain, nausea and vomiting. Negative for diarrhea.  Musculoskeletal: Negative for myalgias.       See HPI.  Skin: Negative.  Negative for color change and wound.  Neurological: Negative.  Negative for numbness.     Physical Exam Updated Vital Signs BP (!) 156/100   Pulse (!) 129   Temp (!) 104.6 F (40.3 C) (Rectal)   Resp (!) 27   SpO2 93%   Physical Exam  Constitutional: He appears well-developed and well-nourished.  Somnolent, sonorous breathing.  HENT:  Head: Normocephalic.  Neck: Normal range of motion. Neck supple.  Cardiovascular: Normal rate and regular rhythm.   Pulmonary/Chest: Effort normal and breath sounds normal. He has no wheezes. He has no rales.  Abdominal: Soft. Bowel sounds are normal. There is no tenderness. There is no rebound and no guarding.  Morbidly obese. Soft, not apparently tender to light or deep palpation.  Musculoskeletal: Normal range of motion.  Right LE edema with erythema and significant warmth. LLE swollen but soft, of normal temperature, without redness.   Skin: Skin is warm and dry. No rash noted.  Psychiatric: He has a normal mood and affect.     ED Treatments / Results  Labs (all labs ordered are listed, but only abnormal results are displayed) Labs Reviewed  COMPREHENSIVE METABOLIC PANEL - Abnormal; Notable for the following:       Result Value   Sodium 131 (*)    Chloride 92 (*)    Glucose, Bld 119 (*)    Total Protein 8.2 (*)     All other components within normal limits  CBC - Abnormal; Notable for the following:    WBC 22.8 (*)    RDW 15.6 (*)    All other components within normal limits  URINALYSIS, ROUTINE W REFLEX MICROSCOPIC - Abnormal; Notable for the following:    Hgb urine dipstick MODERATE (*)    Protein, ur 100 (*)    All other components within normal limits  CULTURE, BLOOD (ROUTINE X 2)  CULTURE, BLOOD (ROUTINE X 2)  LIPASE, BLOOD  TROPONIN I  LACTIC ACID, PLASMA  LACTIC ACID, PLASMA    EKG  EKG Interpretation  Date/Time:  Friday April 28 2017 02:51:33 EDT Ventricular Rate:  110 PR Interval:    QRS Duration: 84 QT Interval:  294 QTC Calculation: 398 R Axis:   55 Text Interpretation:  Sinus tachycardia Probable anteroseptal infarct, old Rate is faster Confirmed by Paula Libra ([id number]) on 04/28/2017 2:54:27 AM       Radiology No results found.  Procedures Procedures (including critical care time)  Medications Ordered in ED Medications  sodium chloride 0.9 % bolus 1,000 mL (0 mLs Intravenous Stopped 04/28/17 0309)    And  sodium chloride 0.9 % bolus 1,000 mL (1,000 mLs Intravenous New Bag/Given 04/28/17 0308)    And  sodium chloride 0.9 % bolus 1,000 mL (not administered)    And  sodium chloride 0.9 % bolus 1,000 mL (not administered)  vancomycin (VANCOCIN) 2,500 mg in sodium chloride 0.9 % 500 mL IVPB (2,500 mg Intravenous New Bag/Given 04/28/17 0305)  acetaminophen (TYLENOL) tablet 650 mg (650 mg Oral Given 04/28/17 0249)  piperacillin-tazobactam (ZOSYN) IVPB 3.375 g (0 g Intravenous Stopped 04/28/17 0326)     Initial Impression / Assessment and Plan / ED Course  I have reviewed the triage vital signs and the nursing notes.  Pertinent labs & imaging results that were available during my care of the patient were reviewed by me and considered in my medical decision making (see chart for details).     Patient presents with c/o abdominal pain and "feet hurt". The  patient is well known to the ED. He is sleeping, easily awakened, which is typical of documented visits in the past. ?OSA with some desaturation during sleep.No respiratory compromise.  Abdominal exam is benign. The right LE findings are consistent with cellulitis. The patient's fever spiked to over 104 rectally. Treated with Tylenol. He presented with significant tachycardia and tachypnea. Concern for sepsis. Code sepsis entered.   IV Vanc and Zosyn started. Cultures pending. Discussed with [facility], Dr. Clyde Lundborg, who accepts the patient for admission.  Final Clinical Impressions(s) / ED Diagnoses   Final diagnoses:  None   1. Sepsis 2. Right LE cellulitis  New Prescriptions New Prescriptions   No medications on file     Elpidio Anis, Cordelia Poche 04/28/17 0354    Molpus, Jonny Ruiz, MD 04/28/17 [phone]

## 2017-04-28 NOTE — H&P (Signed)
History and Physical    [name] [name] [id number]:[id number] DOB: 1971/01/27 DOA: 04/28/2017  Referring MD/NP/PA:   PCP: [name] Ebbs, MD   Patient coming from:  The patient is coming from home.  At baseline, pt is independent for most of ADL.   Chief Complaint: left leg pain and swelling and AMS  HPI: [name] [name] is a 46 y.o. male with medical history significant of hypertension, schizophrenia, bipolar, polysubstance abuse (tobacco, cocaine and the marijuana), who presents with right leg pain and swelling and AMS  Pt is poor historian, the history is limited. He reports that he has right leg pain, but cannot temp: This has been going on. He also reported to EDP that He had abdominal pain with nausea and vomiting, but he denies nausea, vomiting, diarrhea or abdominal pain to me.The patient is somnolent, easily awakened, but minimally communicative. No active cough, respiratory distress.Patient denies chest pain. He moves all extremities normally.  ED Course: pt was found to have WBC 22.8, lactic acid 1.8, negative troponin, UDS positive for cocaine and THC, negative urinalysis, electrolytes renal function okay, temperature 104.6, tachycardia, tachypnea, oxygen saturation 93% on room air. Pending x-ray for right tibia/fibula and right foot. Patient is admitted to telemetry bed as inpatient.  Review of Systems: could not be reviewed accurately due to altered mental status  Allergy:  Allergies  Allergen Reactions  . Tomato Other (See Comments)    unknown    Past Medical History:  Diagnosis Date  . Bipolar depression ([place])   . Gout   . Hypertension   . Pneumonia    had aspiration pneumonia 5/14-on vent  . Schizophrenia [facility])     Past Surgical History:  Procedure Laterality Date  . arm surgery     hurt his arm 25 hr ago  . OPEN REDUCTION INTERNAL FIXATION (ORIF) PROXIMAL PHALANX Right 04/02/2013   Procedure: OPEN REDUCTION INTERNAL FIXATION (ORIF) RIGHT SMALL FINGER;  Surgeon: Tennis Must, MD;  Location: [place];  Service: Orthopedics;  Laterality: Right;    Social History:  reports that he has been smoking Cigarettes.  He has never used smokeless tobacco. He reports that he drinks alcohol. He reports that he uses drugs, including Cocaine.  Family History:  Family History  Problem Relation Age of Onset  . Family history unknown: Yes     Prior to Admission medications   Not on File    Physical Exam: Vitals:   04/28/17 0220 04/28/17 0315 04/28/17 0317 04/28/17 0400  BP:  (!) 156/100 (!) 156/100 125/88  Pulse:  (!) 124 (!) 129 (!) 111  Resp:  (!) 39 (!) 27 (!) 26  Temp: (!) 104.6 F (40.3 C)     TempSrc: Rectal     SpO2:   93%    General: Not in acute distress HEENT:       Eyes: PERRL, EOMI, no scleral icterus.       ENT: No discharge from the ears and nose, no pharynx injection, no tonsillar enlargement.        Neck: No JVD, no bruit, no mass felt. Heme: No neck lymph node enlargement. Cardiac: S1/S2, RRR, No murmurs, No gallops or rubs. Respiratory: No rales, wheezing, rhonchi or rubs. GI: Soft, nondistended, nontender, no rebound pain, no organomegaly, BS present. GU: No hematuria Ext: 2+DP/PT pulse bilaterally. Right lower leg is swelling, warm, erythema and tender. There is small skin abrasion in front shin. Musculoskeletal: No joint deformities, No joint redness or warmth, no limitation  of ROM in spin. Skin: No rashes.  Neuro: drowsy, somnolent, arousable, oriented X3, cranial nerves II-XII grossly intact, moves all extremities normally.  Psych: Patient is not psychotic, no suicidal or hemocidal ideation.  Labs on Admission: I have personally reviewed following labs and imaging studies  CBC:  Recent Labs Lab 04/23/17 0222 04/24/17 0207 04/24/17 2037 04/28/17 0103  WBC  --  9.4 7.4 22.8*  NEUTROABS  --   --  4.8  --   HGB 13.3 12.8* 12.2* 13.8  HCT 39.0 41.0 38.4* 41.5  MCV  --  84.9 82.4 83.0  PLT  --  180 176 465    Basic Metabolic Panel:  Recent Labs Lab 04/23/17 0222 04/24/17 0207 04/24/17 2037 04/28/17 0103  NA 136 135 134* 131*  K 4.2 3.9 3.5 4.2  CL 99* 99* 100* 92*  CO2  --  28 21* 27  GLUCOSE 103* 80 98 119*  BUN 9 15 11 9   CREATININE 0.80 0.90 0.67 0.83  CALCIUM  --  9.0 8.8* 9.3   GFR: Estimated Creatinine Clearance: 139.6 mL/min (by C-G formula based on SCr of 0.83 mg/dL). Liver Function Tests:  Recent Labs Lab 04/24/17 2037 04/28/17 0103  AST 22 30  ALT 17 21  ALKPHOS 64 67  BILITOT 0.2* 0.9  PROT 7.0 8.2*  ALBUMIN 3.4* 3.8    Recent Labs Lab 04/28/17 0103  LIPASE 21   No results for input(s): AMMONIA in the last 168 hours. Coagulation Profile: No results for input(s): INR, PROTIME in the last 168 hours. Cardiac Enzymes:  Recent Labs Lab 04/28/17 0240  TROPONINI <0.03   BNP (last 3 results) No results for input(s): PROBNP in the last 8760 hours. HbA1C: No results for input(s): HGBA1C in the last 72 hours. CBG: No results for input(s): GLUCAP in the last 168 hours. Lipid Profile: No results for input(s): CHOL, HDL, LDLCALC, TRIG, CHOLHDL, LDLDIRECT in the last 72 hours. Thyroid Function Tests: No results for input(s): TSH, T4TOTAL, FREET4, T3FREE, THYROIDAB in the last 72 hours. Anemia Panel: No results for input(s): VITAMINB12, FOLATE, FERRITIN, TIBC, IRON, RETICCTPCT in the last 72 hours. Urine analysis:    Component Value Date/Time   COLORURINE YELLOW 04/28/2017 0214   APPEARANCEUR CLEAR 04/28/2017 0214   LABSPEC 1.016 04/28/2017 0214   PHURINE 7.0 04/28/2017 0214   GLUCOSEU NEGATIVE 04/28/2017 0214   HGBUR MODERATE (A) 04/28/2017 0214   BILIRUBINUR NEGATIVE 04/28/2017 0214   KETONESUR NEGATIVE 04/28/2017 0214   PROTEINUR 100 (A) 04/28/2017 0214   UROBILINOGEN 0.2 07/28/2013 2203   NITRITE NEGATIVE 04/28/2017 0214   LEUKOCYTESUR NEGATIVE 04/28/2017 0214   Sepsis Labs: @LABRCNTIP (procalcitonin:4,lacticidven:4) )No results found for  this or any previous visit (from the past 240 hour(s)).   Radiological Exams on Admission: Dg Tibia/fibula Right  Result Date: 04/28/2017 CLINICAL DATA:  Entire leg and foot are swollen and painful. EXAM: RIGHT TIBIA AND FIBULA - 2 VIEW COMPARISON:  Right ankle 04/20/2017.  Right tibia fibula 02/03/2010 FINDINGS: Diffuse soft tissue swelling with infiltration of the subcutaneous fat of the right lower leg. No radiopaque foreign bodies or soft tissue gas collections identified. No evidence of acute fracture or dislocation involving the tibia or fibula. No focal bone lesion or bone destruction. Bone cortex appears intact. Pes planus. IMPRESSION: Diffuse soft tissue infiltration may indicate cellulitis or edema. No radiopaque foreign bodies. No acute bony abnormalities. No evidence of osteomyelitis. Pes planus of the foot. Electronically Signed   By: Oren Beckmann.D.  On: 04/28/2017 04:15   Dg Foot Complete Right  Result Date: 04/28/2017 CLINICAL DATA:  Entire leg and foot are swollen and painful. EXAM: RIGHT FOOT COMPLETE - 3+ VIEW COMPARISON:  04/20/2017 FINDINGS: Pes planus deformity of the right foot. 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 Old fracture deformity of the fifth proximal phalanx. No evidence of acute fracture or dislocation. Diffuse soft tissue swelling. There is suggestion of erosion to the distal phalangeal tuft of the first toe which may indicate osteomyelitis or inflammatory process. IMPRESSION: Pes planus. Tuft erosion of the distal phalanx of the first toe. Diffuse soft tissue swelling. No acute fracture or dislocation. Electronically Signed   By: Lucienne Capers M.D.   On: 04/28/2017 04:17     EKG: Independently reviewed.  Sinus rhythm, QTC 398, anteroseptal infarction pattern.  Assessment/Plan Principal Problem:   Cellulitis of leg, right Active Problems:   Sepsis ([place])   Essential hypertension   Polysubstance abuse ([place])   Acute metabolic  encephalopathy   Sepsis due to cellulitis of right leg: right lower leg is swelling, warm, erythema and tender, consistent with cellulitis. Patient meets criteria for sepsis with fever, leukocytosis, tachycardia and tachypnea. Lactic acid is normal. Currently hemodynamically stable.  - will admit to tele bed as inpt - Empiric antimicrobial treatment with vancomycin and Zosyn per pharmacy - PRN Zofran for nausea, Percocet and toradol for pain - Blood cultures x 2  - ESR and CRP - will get Procalcitonin and trend lactic acid levels per sepsis protocol. - IVF: 4 L of NS bolus in ED, followed by 4 cc/h -LE doppler to r/o DVT - f/u X-ray of R leg and foot  Essential hypertension: pt seems not taking Bp meds at home -IV hydralazine when necessary  Polysubstance abuse: including tobacco, cocaine and marijuana. Patient has positive UDS for cocaine and THC -nicotine patch -Need counseling about importance of quitting substance use when mental status improves  Acute metabolic encephalopathy: accurately due to multifactorial including cocaine abuse and sepsis. -Frequent neuro check  Nausea, vomiting, abdominal pain: Patient currently denies nausea, vomiting, diarrhea or abdominal pain. No acute abdomen on physical examination. -Check lipase -When necessary Zofran for nausea  DVT ppx: SQ Lovenox Code Status: Full code Family Communication: None at bed side.   Disposition Plan:  Anticipate discharge back to previous home environment Consults called:  none Admission status: inpatient/tele     Date of Service 04/28/2017    Ivor Costa [facility] Pager [phone]  If 7PM-7AM, please contact night-coverage [email or web address] Password [email or web address] 04/28/2017, 4:30 AM

## 2017-05-02 ENCOUNTER — Emergency Department (HOSPITAL_COMMUNITY)
Admission: EM | Admit: 2017-05-02 | Discharge: 2017-05-02 | Disposition: A | Discharge status: Home / Self Care | Payer: Medicare Other | Attending: Emergency Medicine | Admitting: Emergency Medicine

## 2017-05-02 ENCOUNTER — Encounter (HOSPITAL_COMMUNITY): Payer: Self-pay | Admitting: *Deleted

## 2017-05-02 DIAGNOSIS — R45850 Homicidal ideations: Secondary | ICD-10-CM | POA: Diagnosis not present

## 2017-05-02 DIAGNOSIS — F209 Schizophrenia, unspecified: Secondary | ICD-10-CM | POA: Diagnosis not present

## 2017-05-02 DIAGNOSIS — F17210 Nicotine dependence, cigarettes, uncomplicated: Secondary | ICD-10-CM | POA: Insufficient documentation

## 2017-05-02 DIAGNOSIS — Z5900 Homelessness unspecified: Secondary | ICD-10-CM

## 2017-05-02 LAB — CBC WITH DIFFERENTIAL/PLATELET
BASOS PCT: 1 %
Basophils Absolute: 0.1 10*3/uL (ref 0.0–0.1)
Eosinophils Absolute: 0.2 10*3/uL (ref 0.0–0.7)
Eosinophils Relative: 2 %
HEMATOCRIT: 36.2 % — AB (ref 39.0–52.0)
HEMOGLOBIN: 11.8 g/dL — AB (ref 13.0–17.0)
Lymphocytes Relative: 13 %
Lymphs Abs: 1.3 10*3/uL (ref 0.7–4.0)
MCH: 26.8 pg (ref 26.0–34.0)
MCHC: 32.6 g/dL (ref 30.0–36.0)
MCV: 82.1 fL (ref 78.0–100.0)
MONOS PCT: 11 %
Monocytes Absolute: 1.1 10*3/uL — ABNORMAL HIGH (ref 0.1–1.0)
Neutro Abs: 7.4 10*3/uL (ref 1.7–7.7)
Neutrophils Relative %: 73 %
Platelets: 208 10*3/uL (ref 150–400)
RBC: 4.41 MIL/uL (ref 4.22–5.81)
RDW: 15.5 % (ref 11.5–15.5)
WBC: 10 10*3/uL (ref 4.0–10.5)

## 2017-05-02 LAB — COMPREHENSIVE METABOLIC PANEL
ALK PHOS: 65 U/L (ref 38–126)
ALT: 23 U/L (ref 17–63)
AST: 26 U/L (ref 15–41)
Albumin: 3.2 g/dL — ABNORMAL LOW (ref 3.5–5.0)
Anion gap: 11 (ref 5–15)
BILIRUBIN TOTAL: 0.3 mg/dL (ref 0.3–1.2)
BUN: 11 mg/dL (ref 6–20)
CALCIUM: 9 mg/dL (ref 8.9–10.3)
CO2: 27 mmol/L (ref 22–32)
Chloride: 95 mmol/L — ABNORMAL LOW (ref 101–111)
Creatinine, Ser: 0.69 mg/dL (ref 0.61–1.24)
Glucose, Bld: 104 mg/dL — ABNORMAL HIGH (ref 65–99)
Potassium: 3.8 mmol/L (ref 3.5–5.1)
Sodium: 133 mmol/L — ABNORMAL LOW (ref 135–145)
Total Protein: 7.4 g/dL (ref 6.5–8.1)

## 2017-05-02 LAB — RAPID URINE DRUG SCREEN, HOSP PERFORMED
Amphetamines: NOT DETECTED
Barbiturates: NOT DETECTED
Benzodiazepines: NOT DETECTED
Cocaine: NOT DETECTED
OPIATES: NOT DETECTED
TETRAHYDROCANNABINOL: POSITIVE — AB

## 2017-05-02 LAB — ACETAMINOPHEN LEVEL: Acetaminophen (Tylenol), Serum: <10 ug/mL — ABNORMAL LOW (ref 10–30)

## 2017-05-02 LAB — SALICYLATE LEVEL

## 2017-05-02 LAB — ETHANOL

## 2017-05-02 NOTE — BH Assessment (Addendum)
Assessment Note  [name] [name] is an 46 y.o. male.  -Clinician reviewed note by Dr. Blinda Leatherwood.  Patient presents to the emergency department in custody of police.  Patient reportedly called 911 earlier and said he wanted to kill someone.  Patient has a history of schizophrenia.  He reports that he normally gets Haldol Decanoate as well as oral Haldol and Cogentin.  He has been out of his oral meds and is due for his shot.  Arrival to the ER he reports that he is homeless and hungry.  He no longer wants to harm himself or others.  Patient solidly and enthusiastically snored throughout the assessment.  Patient did answer (literally between snores) "No" to questions regarding wanting to kill himself or kill others.  Patient unclear (again snoring) regarding hearing voices.  He was very interested in when breakfast was and what selections of snack items the ED had to offer.   Patient is positive for marijuana.  He has a history of drinking but has no ETOH level.    Patient has a history of non-compliance with medications.  He says he has been off meds "for a long time."    Patient was last seen in May 2018 for a [facility] Assessment.  Around that time he had served some time for two weeks for robbery charge.  He now denies any legal issues.  -Clinician discussed patient care with Donell Sievert, PA who recommends AM discharge.  Clinician discussed with Dr. Blinda Leatherwood and he is in agreement.  Pt to be discharged in AM.  Diagnosis: Schizophrenia  Past Medical History:  Past Medical History:  Diagnosis Date  . Bipolar depression ([facility])   . Gout   . Hypertension   . Pneumonia    had aspiration pneumonia 5/14-on vent  . Schizophrenia [facility])     Past Surgical History:  Procedure Laterality Date  . arm surgery     hurt his arm 25 hr ago  . OPEN REDUCTION INTERNAL FIXATION (ORIF) PROXIMAL PHALANX Right 04/02/2013   Procedure: OPEN REDUCTION INTERNAL FIXATION (ORIF) RIGHT SMALL FINGER;  Surgeon: Tami Ribas, MD;  Location: [facility];  Service: Orthopedics;  Laterality: Right;    Family History:  Family History  Problem Relation Age of Onset  . Family history unknown: Yes    Social History:  reports that he has been smoking Cigarettes.  He has never used smokeless tobacco. He reports that he drinks alcohol. He reports that he uses drugs, including Cocaine.  Additional Social History:  Alcohol / Drug Use Pain Medications: None Prescriptions: Pt says he has been off his meds for weeks Over the Counter: None History of alcohol / drug use?: Yes Substance #1 Name of Substance 1: Marijuana 1 - Age of First Use: Teens 1 - Amount (size/oz): Unknown 1 - Frequency: Varies 1 - Duration: off and on  1 - Last Use / Amount: Pt says "a long time ago."  CIWA: CIWA-Ar BP: (!) 159/94 Pulse Rate: 88 COWS:    Allergies:  Allergies  Allergen Reactions  . Tomato Other (See Comments)    unknown    Home Medications:  (Not in a hospital admission)  OB/GYN Status:  No LMP for male patient.  General Assessment Data Location of Assessment: [facility] ED TTS Assessment: In system Is this a Tele or Face-to-Face Assessment?: Face-to-Face Is this an Initial Assessment or a Re-assessment for this encounter?: Initial Assessment Marital status: Single Is patient pregnant?: No Pregnancy Status: No Living Arrangements:  Other (Comment) (Pt is homeless.  Not staying at a shelter.) Can pt return to current living arrangement?: Yes Admission Status: Voluntary Is patient capable of signing voluntary admission?: Yes Referral Source: Self/Family/Friend (Pt called 911 to get lift to hospital.) Insurance type: [facility]     Crisis Care Plan Living Arrangements: Other (Comment) (Pt is homeless.  Not staying at a shelter.) Legal Guardian: Other relative Rennie Rouch [phone]) Name of Psychiatrist: Vesta Mixer Name of Therapist: Vesta Mixer  Education Status Is patient currently in school?:  No Highest grade of school patient has completed: 9th grade  Risk to self with the past 6 months Suicidal Ideation: No Has patient been a risk to self within the past 6 months prior to admission? : No Suicidal Intent: No Has patient had any suicidal intent within the past 6 months prior to admission? : No Is patient at risk for suicide?: No Suicidal Plan?: No Has patient had any suicidal plan within the past 6 months prior to admission? : No Access to Means: No What has been your use of drugs/alcohol within the last 12 months?: Marijuana Previous Attempts/Gestures: No How many times?: 0 Other Self Harm Risks: NOne Triggers for Past Attempts: None known Intentional Self Injurious Behavior: None Family Suicide History: No Recent stressful life event(s): Other (Comment) (Homelessness) Persecutory voices/beliefs?: Yes Depression: Yes Depression Symptoms: Despondent, Fatigue, Feeling worthless/self pity, Loss of interest in usual pleasures Substance abuse history and/or treatment for substance abuse?: Yes Suicide prevention information given to non-admitted patients: Not applicable  Risk to Others within the past 6 months Homicidal Ideation: No Does patient have any lifetime risk of violence toward others beyond the six months prior to admission? : Yes (comment) (Pt has had some incarceration.  Presumably at risk for viole) Thoughts of Harm to Others: No-Not Currently Present/Within Last 6 Months Current Homicidal Intent: No Current Homicidal Plan: No Access to Homicidal Means: No Identified Victim: No one History of harm to others?: No Assessment of Violence: None Noted Violent Behavior Description: None repored now Does patient have access to weapons?: No Criminal Charges Pending?: No Does patient have a court date: No Is patient on probation?: No  Psychosis Hallucinations: Auditory (Voices are unintelligible) Delusions: None noted  Mental Status Report Appearance/Hygiene:  Body odor, Disheveled, In scrubs, Poor hygiene Eye Contact: Poor Motor Activity: Freedom of movement, Unremarkable Speech: Logical/coherent, Slurred, Other (Comment) (Pt would speak in between snores.) Level of Consciousness: Sleeping, Drowsy Mood: Depressed, Sad Affect: Appropriate to circumstance Anxiety Level: None Thought Processes: Irrelevant Judgement: Impaired Orientation: Appropriate for developmental age Obsessive Compulsive Thoughts/Behaviors: None  Cognitive Functioning Concentration: Poor Memory: Recent Impaired, Remote Intact IQ: Average Insight: Poor Impulse Control: Fair Appetite: Good Weight Loss: 0 Weight Gain: 0 Sleep: No Change Total Hours of Sleep:  (Pt too sleepy to tell (ironic)) Vegetative Symptoms: None  ADLScreening [facility] Assessment Services) Patient's cognitive ability adequate to safely complete daily activities?: Yes Patient able to express need for assistance with ADLs?: Yes Independently performs ADLs?: Yes (appropriate for developmental age)  Prior Inpatient Therapy Prior Inpatient Therapy: Yes Prior Therapy Dates: Pt can't say Prior [facility]Provider([facility]): [facility] Reason for Treatment: [facility]  Prior Outpatient Therapy Prior Outpatient Therapy: Yes Prior Therapy Dates: Current Prior Therapy Facilty/Provider(s): [facility] Reason for Treatment: Med management Does patient have an ACCT team?: No Does patient have Intensive In-House Services?  : No Does patient have [facility] services? : Yes Does patient have [facility] services?: No  ADL Screening (condition at time of admission) Patient's  cognitive ability adequate to safely complete daily activities?: Yes Is the patient deaf or have difficulty hearing?: No Does the patient have difficulty seeing, even when wearing glasses/contacts?: No Does the patient have difficulty concentrating, remembering, or making decisions?: Yes Patient able to express need for assistance with ADLs?: Yes Does the patient  have difficulty dressing or bathing?: No Independently performs ADLs?: Yes (appropriate for developmental age) Does the patient have difficulty walking or climbing stairs?: No Weakness of Legs: None Weakness of Arms/Hands: None       Abuse/Neglect Assessment (Assessment to be complete while patient is alone) Physical Abuse: Denies Verbal Abuse: Denies Sexual Abuse: Denies Exploitation of patient/patient's resources: Denies Self-Neglect: Denies     Merchant navy officerAdvance Directives (For Healthcare) Does Patient Have a Medical Advance Directive?: No Would patient like information on creating a medical advance directive?: No - Patient declined    Additional Information 1:1 In Past 12 Months?: No CIRT Risk: No Elopement Risk: No Does patient have medical clearance?: Yes     Disposition:  Disposition Initial Assessment Completed for this Encounter: Yes Disposition of Patient: Other dispositions Other disposition(s): Other (Comment) (Pt to be reviewed by PA)  On Site Evaluation by:   Reviewed with Physician:    Alexandria LodgeHarvey, Madhavi Hamblen Ray 05/02/2017 6:27 AM

## 2017-05-02 NOTE — ED Notes (Signed)
Pt reluctant to change in to scrubs.

## 2017-05-02 NOTE — ED Notes (Signed)
Patient denies pain and is resting comfortably.  

## 2017-05-02 NOTE — ED Triage Notes (Signed)
Patient states he wants to hurt himself and hurt others. Patient does not have a plan to hurt himself.

## 2017-05-02 NOTE — ED Notes (Signed)
TTS assessment in progress. 

## 2017-05-02 NOTE — ED Provider Notes (Addendum)
[facility]-EMERGENCY DEPT Provider Note   CSN: [id number] Arrival date & time: 05/02/17  0056     History   Chief Complaint Chief Complaint  Patient presents with  . Suicidal  . Homicidal    HPI [name] [name] is a 46 y.o. male.  Patient presents to the emergency department in custody of police.  Patient reportedly called 911 earlier and said he wanted to kill someone.  Patient has a history of schizophrenia.  He reports that he normally gets Haldol Decanoate as well as oral Haldol and Cogentin.  He has been out of his oral meds and is due for his shot.  Arrival to the ER he reports that he is homeless and hungry.  He no longer wants to harm himself or others.      Past Medical History:  Diagnosis Date  . Bipolar depression (HCC)   . Gout   . Hypertension   . Pneumonia    had aspiration pneumonia 5/14-on vent  . Schizophrenia [facility])     Patient Active Problem List   Diagnosis Date Noted  . Sepsis (HCC) 04/28/2017  . Cellulitis of leg, right 04/28/2017  . Essential hypertension 04/28/2017  . Tobacco abuse 04/28/2017  . Polysubstance abuse (HCC) 04/28/2017  . Acute metabolic encephalopathy 04/28/2017  . Somnolence 01/09/2017  . Paranoid schizophrenia (HCC) 04/20/2013  . Acute respiratory failure with hypercapnia (HCC) 11/17/2012  . Encephalopathy acute 11/17/2012    Past Surgical History:  Procedure Laterality Date  . arm surgery     hurt his arm 25 hr ago  . OPEN REDUCTION INTERNAL FIXATION (ORIF) PROXIMAL PHALANX Right 04/02/2013   Procedure: OPEN REDUCTION INTERNAL FIXATION (ORIF) RIGHT SMALL FINGER;  Surgeon: Tami Ribas, MD;  Location:  SURGERY CENTER;  Service: Orthopedics;  Laterality: Right;       Home Medications    Prior to Admission medications   Not on File    Family History Family History  Problem Relation Age of Onset  . Family history unknown: Yes    Social History Social History  Substance Use  Topics  . Smoking status: Current Every Day Smoker    Types: Cigarettes  . Smokeless tobacco: Never Used  . Alcohol use Yes     Comment: Unknown last drink.      Allergies   Tomato   Review of Systems Review of Systems  Psychiatric/Behavioral: Positive for agitation and dysphoric mood.  All other systems reviewed and are negative.    Physical Exam Updated Vital Signs BP (!) 159/94 (BP Location: Left Arm)   Pulse 88   Temp 99.7 F (37.6 C) (Oral)   Resp (!) 22   Ht 5\' 8"  (1.727 m)   Wt 117 kg (258 lb)   SpO2 100%   BMI 39.23 kg/m   Physical Exam  Constitutional: He is oriented to person, place, and time. He appears well-developed and well-nourished. No distress.  HENT:  Head: Normocephalic and atraumatic.  Right Ear: Hearing normal.  Left Ear: Hearing normal.  Nose: Nose normal.  Mouth/Throat: Oropharynx is clear and moist and mucous membranes are normal.  Eyes: Pupils are equal, round, and reactive to light. Conjunctivae and EOM are normal.  Neck: Normal range of motion. Neck supple.  Cardiovascular: Regular rhythm, S1 normal and S2 normal.  Exam reveals no gallop and no friction rub.   No murmur heard. Pulmonary/Chest: Effort normal and breath sounds normal. No respiratory distress. He exhibits no tenderness.  Abdominal: Soft. Normal appearance  and bowel sounds are normal. There is no hepatosplenomegaly. There is no tenderness. There is no rebound, no guarding, no tenderness at McBurney's point and negative Murphy's sign. No hernia.  Musculoskeletal: Normal range of motion.  Neurological: He is alert and oriented to person, place, and time. He has normal strength. No cranial nerve deficit or sensory deficit. Coordination normal. GCS eye subscore is 4. GCS verbal subscore is 5. GCS motor subscore is 6.  Skin: Skin is warm, dry and intact. No rash noted. No cyanosis.  Psychiatric: He has a normal mood and affect. His speech is normal and behavior is normal. Thought  content normal.  Nursing note and vitals reviewed.    ED Treatments / Results  Labs (all labs ordered are listed, but only abnormal results are displayed) Labs Reviewed  COMPREHENSIVE METABOLIC PANEL - Abnormal; Notable for the following:       Result Value   Sodium 133 (*)    Chloride 95 (*)    Glucose, Bld 104 (*)    Albumin 3.2 (*)    All other components within normal limits  RAPID URINE DRUG SCREEN, HOSP PERFORMED - Abnormal; Notable for the following:    Tetrahydrocannabinol POSITIVE (*)    All other components within normal limits  CBC WITH DIFFERENTIAL/PLATELET - Abnormal; Notable for the following:    Hemoglobin 11.8 (*)    HCT 36.2 (*)    Monocytes Absolute 1.1 (*)    All other components within normal limits  ACETAMINOPHEN LEVEL - Abnormal; Notable for the following:    Acetaminophen (Tylenol), Serum <10 (*)    All other components within normal limits  ETHANOL  SALICYLATE LEVEL    EKG  EKG Interpretation None       Radiology No results found.  Procedures Procedures (including critical care time)  Medications Ordered in ED Medications - No data to display   Initial Impression / Assessment and Plan / ED Course  I have reviewed the triage vital signs and the nursing notes.  Pertinent labs & imaging results that were available during my care of the patient were reviewed by me and considered in my medical decision making (see chart for details).     Patient initially reported that he was homicidal.  He no longer states that he wants to hurt anybody.  He is homeless and I suspect that he was looking for a warm place to stay and a meal.  Will administer Haldol and feed the patient, re-eval in the morning.  [id number] -recheck of patient reveals that he is still not homicidal or suicidal.  TTS has evaluated the patient and recommended discharge.  Final Clinical Impressions(s) / ED Diagnoses   Final diagnoses:  Homicidal ideation  Homelessness    New  Prescriptions New Prescriptions   No medications on file     Gilda CreasePollina, Eleasha Cataldo J, MD 05/02/17 0341    Gilda CreasePollina, Fardeen Steinberger J, MD 05/02/17 [phone]

## 2017-05-02 NOTE — ED Triage Notes (Signed)
Pt brought in by [employer].  Pt stated "I'm homeless, I'm hearing voices.  I want something to eat."

## 2017-05-03 ENCOUNTER — Emergency Department (HOSPITAL_COMMUNITY)
Admission: EM | Admit: 2017-05-03 | Discharge: 2017-05-04 | Disposition: A | Discharge status: Home / Self Care | Payer: Medicare Other | Attending: Emergency Medicine | Admitting: Emergency Medicine

## 2017-05-03 DIAGNOSIS — F17210 Nicotine dependence, cigarettes, uncomplicated: Secondary | ICD-10-CM | POA: Diagnosis not present

## 2017-05-03 DIAGNOSIS — R600 Localized edema: Secondary | ICD-10-CM | POA: Diagnosis not present

## 2017-05-03 DIAGNOSIS — I10 Essential (primary) hypertension: Secondary | ICD-10-CM | POA: Diagnosis not present

## 2017-05-03 DIAGNOSIS — M259 Joint disorder, unspecified: Secondary | ICD-10-CM | POA: Diagnosis present

## 2017-05-03 LAB — CULTURE, BLOOD (ROUTINE X 2)
CULTURE: NO GROWTH
Culture: NO GROWTH
SPECIAL REQUESTS: ADEQUATE
Special Requests: ADEQUATE

## 2017-05-03 NOTE — ED Notes (Signed)
Bed: WTR7 Expected date:  Expected time:  Means of arrival:  Comments: 

## 2017-05-04 NOTE — ED Provider Notes (Signed)
[facility]-EMERGENCY DEPT Provider Note   CSN: [id number] Arrival date & time: 05/03/17  2254     History   Chief Complaint Chief Complaint  Patient presents with  . Joint Swelling    HPI [name] [name] is a 46 y.o. male.  The history is provided by the patient and medical records.     46 year old male with history of bipolar depression, gout, hypertension, schizophrenia, presenting to the ED for pedal edema.  Reports he has been walking a lot the past few days and has made this worse.  Denies any calf pain/swelling.  No hx of DVT.  No other complaints at this time.  States he felt like he may need antibiotics so he wanted to be evaluated.  Past Medical History:  Diagnosis Date  . Bipolar depression (HCC)   . Gout   . Hypertension   . Pneumonia    had aspiration pneumonia 5/14-on vent  . Schizophrenia [facility])     Patient Active Problem List   Diagnosis Date Noted  . Sepsis (HCC) 04/28/2017  . Cellulitis of leg, right 04/28/2017  . Essential hypertension 04/28/2017  . Tobacco abuse 04/28/2017  . Polysubstance abuse (HCC) 04/28/2017  . Acute metabolic encephalopathy 04/28/2017  . Somnolence 01/09/2017  . Paranoid schizophrenia (HCC) 04/20/2013  . Acute respiratory failure with hypercapnia (HCC) 11/17/2012  . Encephalopathy acute 11/17/2012    Past Surgical History:  Procedure Laterality Date  . arm surgery     hurt his arm 25 hr ago  . OPEN REDUCTION INTERNAL FIXATION (ORIF) PROXIMAL PHALANX Right 04/02/2013   Procedure: OPEN REDUCTION INTERNAL FIXATION (ORIF) RIGHT SMALL FINGER;  Surgeon: Tami Ribas, MD;  Location: [facility];  Service: Orthopedics;  Laterality: Right;       Home Medications    Prior to Admission medications   Not on File    Family History Family History  Problem Relation Age of Onset  . Family history unknown: Yes    Social History Social History  Substance Use Topics  . Smoking status:  Current Every Day Smoker    Types: Cigarettes  . Smokeless tobacco: Never Used  . Alcohol use Yes     Comment: Unknown last drink.      Allergies   Tomato   Review of Systems Review of Systems  Musculoskeletal: Positive for joint swelling.  All other systems reviewed and are negative.    Physical Exam Updated Vital Signs There were no vitals taken for this visit.  Physical Exam  Constitutional: He is oriented to person, place, and time. He appears well-developed and well-nourished.  Disheveled appearing, dirty, smells of body odor  HENT:  Head: Normocephalic and atraumatic.  Mouth/Throat: Oropharynx is clear and moist.  Eyes: Pupils are equal, round, and reactive to light. Conjunctivae and EOM are normal.  Neck: Normal range of motion.  Cardiovascular: Normal rate, regular rhythm and normal heart sounds.   Pulmonary/Chest: Effort normal and breath sounds normal.  Abdominal: Soft. Bowel sounds are normal.  Musculoskeletal: Normal range of motion.  1+ pedal edema at the ankles; no deformities; no overlying erythema; no calf tenderness; DP pulses intact bilaterally  Neurological: He is alert and oriented to person, place, and time.  Skin: Skin is warm and dry.  Psychiatric: He has a normal mood and affect.  Nursing note and vitals reviewed.    ED Treatments / Results  Labs (all labs ordered are listed, but only abnormal results are displayed) Labs Reviewed - No  data to display  EKG  EKG Interpretation None       Radiology No results found.  Procedures Procedures (including critical care time)  Medications Ordered in ED Medications - No data to display   Initial Impression / Assessment and Plan / ED Course  I have reviewed the triage vital signs and the nursing notes.  Pertinent labs & imaging results that were available during my care of the patient were reviewed by me and considered in my medical decision making (see chart for details).  46 year old  male here with pedal edema.  He has been seen here numerous times for same complaint with 34 visits in the past 6 months alone.  He does have 1+ edema of the ankles but no signs of superimposed infection or cellulitis. He has been sleeping soundly here.  Vitals are stable.  I recommended that he follow-up closely with his primary care doctor.  Patient discharged home in stable condition.  Final Clinical Impressions(s) / ED Diagnoses   Final diagnoses:  Pedal edema    New Prescriptions New Prescriptions   No medications on file     Garlon HatchetSanders, Bonnie Overdorf M, PA-C 05/04/17 0548    Palumbo, April, MD 05/04/17 [phone]

## 2017-05-04 NOTE — Discharge Instructions (Signed)
Follow up with your primary care doctor.

## 2017-05-04 NOTE — ED Triage Notes (Signed)
Pt complains of bilateral ankle swelling for several days and states he needs an antibiotic

## 2017-05-05 ENCOUNTER — Emergency Department (HOSPITAL_COMMUNITY)
Admission: EM | Admit: 2017-05-05 | Discharge: 2017-05-05 | Disposition: A | Discharge status: Home / Self Care | Payer: Medicare Other | Attending: Emergency Medicine | Admitting: Emergency Medicine

## 2017-05-05 DIAGNOSIS — M79606 Pain in leg, unspecified: Secondary | ICD-10-CM | POA: Diagnosis not present

## 2017-05-05 DIAGNOSIS — Z5321 Procedure and treatment not carried out due to patient leaving prior to being seen by health care provider: Secondary | ICD-10-CM | POA: Diagnosis not present

## 2017-05-05 NOTE — ED Triage Notes (Signed)
Pt BIB [employer]. He states that he wants his legs wrapped and an antibiotic. He endorses visual and auditory hallucinations and pelvic pain. Denies SI (states that he hasn't had SI since '85.) Ambulatory,

## 2017-05-05 NOTE — ED Triage Notes (Signed)
Pt not in the lobby 

## 2017-05-07 ENCOUNTER — Emergency Department (HOSPITAL_COMMUNITY)
Admission: EM | Admit: 2017-05-07 | Discharge: 2017-05-07 | Disposition: A | Discharge status: Home / Self Care | Payer: Medicare Other | Attending: Emergency Medicine | Admitting: Emergency Medicine

## 2017-05-07 ENCOUNTER — Encounter (HOSPITAL_COMMUNITY): Payer: Self-pay | Admitting: Emergency Medicine

## 2017-05-07 DIAGNOSIS — G8929 Other chronic pain: Secondary | ICD-10-CM | POA: Insufficient documentation

## 2017-05-07 DIAGNOSIS — M79605 Pain in left leg: Secondary | ICD-10-CM | POA: Insufficient documentation

## 2017-05-07 DIAGNOSIS — R2243 Localized swelling, mass and lump, lower limb, bilateral: Secondary | ICD-10-CM | POA: Diagnosis not present

## 2017-05-07 DIAGNOSIS — M79604 Pain in right leg: Secondary | ICD-10-CM | POA: Diagnosis not present

## 2017-05-07 DIAGNOSIS — F17210 Nicotine dependence, cigarettes, uncomplicated: Secondary | ICD-10-CM | POA: Insufficient documentation

## 2017-05-07 DIAGNOSIS — I10 Essential (primary) hypertension: Secondary | ICD-10-CM | POA: Diagnosis not present

## 2017-05-07 NOTE — ED Triage Notes (Signed)
Brought [place] by EMS from off the road with c/o bilateral leg pain.  Pt reported that he has been walking all day and now both legs are "hurting and popping".

## 2017-05-07 NOTE — ED Provider Notes (Signed)
[facility]-EMERGENCY DEPT Provider Note   CSN: [id number] Arrival date & time: 05/07/17  [id number]     History   Chief Complaint Chief Complaint  Patient presents with  . Leg Pain    HPI [name] [name] is a 46 y.o. male.  HPI  [name] [name] is a 46 y.o. male, with a history of bipolar depression, gout, hypertension, and schizophrenia, presenting to the ED with lower extremity swelling and pain. States, "It's been like that for a long time. It's nothing new." States, "I'm here because it's better for me in here than out on those streets."  Patient would not characterize his pain and refused to answer any other questions regarding his current complaint, but does deny shortness of breath, chest pain, recent trauma, fever/chills, or any other complaints.     Past Medical History:  Diagnosis Date  . Bipolar depression (HCC)   . Gout   . Hypertension   . Pneumonia    had aspiration pneumonia 5/14-on vent  . Schizophrenia [facility](HCC)     Patient Active Problem List   Diagnosis Date Noted  . Sepsis (HCC) 04/28/2017  . Cellulitis of leg, right 04/28/2017  . Essential hypertension 04/28/2017  . Tobacco abuse 04/28/2017  . Polysubstance abuse (HCC) 04/28/2017  . Acute metabolic encephalopathy 04/28/2017  . Somnolence 01/09/2017  . Paranoid schizophrenia (HCC) 04/20/2013  . Acute respiratory failure with hypercapnia (HCC) 11/17/2012  . Encephalopathy acute 11/17/2012    Past Surgical History:  Procedure Laterality Date  . arm surgery     hurt his arm 25 hr ago  . OPEN REDUCTION INTERNAL FIXATION (ORIF) PROXIMAL PHALANX Right 04/02/2013   Procedure: OPEN REDUCTION INTERNAL FIXATION (ORIF) RIGHT SMALL FINGER;  Surgeon: Tami RibasKevin R Kuzma, MD;  Location: [facility];  Service: Orthopedics;  Laterality: Right;       Home Medications    Prior to Admission medications   Not on File    Family History Family History  Problem Relation Age of Onset  .  Family history unknown: Yes    Social History Social History  Substance Use Topics  . Smoking status: Current Every Day Smoker    Types: Cigarettes  . Smokeless tobacco: Never Used  . Alcohol use Yes     Comment: Unknown last drink.      Allergies   Tomato   Review of Systems Review of Systems  Constitutional: Negative for chills and fever.  Respiratory: Negative for shortness of breath.   Cardiovascular: Positive for leg swelling. Negative for chest pain.  Gastrointestinal: Negative for abdominal pain, nausea and vomiting.  Musculoskeletal: Positive for myalgias.  Neurological: Negative for weakness and numbness.  All other systems reviewed and are negative.    Physical Exam Updated Vital Signs BP (!) 157/86   Pulse 88   Temp 98.8 F (37.1 C) (Oral)   Resp 20   Ht 5\' 8"  (1.727 m)   Wt 117 kg (258 lb)   SpO2 98%   BMI 39.23 kg/m   Physical Exam  Constitutional: He appears well-developed and well-nourished. No distress.  Obese black male.  Patient has an unkempt appearance and and aroma consistent with urine.  HENT:  Head: Normocephalic and atraumatic.  Eyes: Conjunctivae are normal.  Neck: Neck supple.  Cardiovascular: Normal rate, regular rhythm, normal heart sounds and intact distal pulses.   Pulmonary/Chest: Effort normal and breath sounds normal. No respiratory distress.  Patient is noted to be lying flat on his back in the bed  with no noted distress.  Abdominal: Soft. There is no tenderness. There is no guarding.  Musculoskeletal: He exhibits edema and tenderness.  Patient indicates tenderness to the bilateral lower extremities equally.  He is noted to have bilateral lower leg edema that appears chronic.  His extremities are appropriately physiologically warm.  No noted erythema.  Lymphadenopathy:    He has no cervical adenopathy.  Neurological: He is alert.  No noted acute sensory deficits. Strength 5/5 with flexion and extension at the bilateral  hips, knees, and ankles. No noted gait deficit.  Skin: Skin is warm and dry. Capillary refill takes less than 2 seconds. He is not diaphoretic.  Psychiatric: He has a normal mood and affect. His behavior is normal.  Nursing note and vitals reviewed.    ED Treatments / Results  Labs (all labs ordered are listed, but only abnormal results are displayed) Labs Reviewed - No data to display  EKG  EKG Interpretation None       Radiology No results found.  Procedures Procedures (including critical care time)  Medications Ordered in ED Medications - No data to display   Initial Impression / Assessment and Plan / ED Course  I have reviewed the triage vital signs and the nursing notes.  Pertinent labs & imaging results that were available during my care of the patient were reviewed by me and considered in my medical decision making (see chart for details).      Patient presents with a complaint of chronic leg pain.  He has been evaluated multiple times recently for the same.  He endorses no change in his symptoms.  Patient explicitly states he is here because he does not want to be outside.  Advise PCP follow-up as needed.  Findings and plan of care discussed with Dr. Read Drivers.   Final Clinical Impressions(s) / ED Diagnoses   Final diagnoses:  Chronic pain of both lower extremities    New Prescriptions There are no discharge medications for this patient.    Anselm Pancoast, PA-C 05/07/17 1610    Paula Libra, MD 05/10/17 2239

## 2017-05-07 NOTE — Discharge Instructions (Signed)
Please follow up with your primary care provider for further management of this issue. Return to the ED as needed.

## 2017-05-09 DIAGNOSIS — F17210 Nicotine dependence, cigarettes, uncomplicated: Secondary | ICD-10-CM | POA: Diagnosis not present

## 2017-05-09 DIAGNOSIS — L98499 Non-pressure chronic ulcer of skin of other sites with unspecified severity: Secondary | ICD-10-CM | POA: Insufficient documentation

## 2017-05-09 DIAGNOSIS — M79671 Pain in right foot: Secondary | ICD-10-CM | POA: Diagnosis present

## 2017-05-09 NOTE — ED Triage Notes (Addendum)
Per EMS, patient from gas station, c/o depression. Denies SI/HI. Ambulatory. Reports drinking beer today.   BP 128/78 HR 76

## 2017-05-10 ENCOUNTER — Emergency Department (HOSPITAL_COMMUNITY)
Admission: EM | Admit: 2017-05-10 | Discharge: 2017-05-10 | Disposition: A | Discharge status: Home / Self Care | Payer: Medicare Other | Attending: Emergency Medicine | Admitting: Emergency Medicine

## 2017-05-10 DIAGNOSIS — L97529 Non-pressure chronic ulcer of other part of left foot with unspecified severity: Secondary | ICD-10-CM

## 2017-05-10 DIAGNOSIS — L97519 Non-pressure chronic ulcer of other part of right foot with unspecified severity: Secondary | ICD-10-CM

## 2017-05-10 MED ORDER — BACITRACIN ZINC 500 UNIT/GM EX OINT
TOPICAL_OINTMENT | Freq: Two times a day (BID) | CUTANEOUS | Status: DC
  Administered 2017-05-10: 1 via TOPICAL
  Filled 2017-05-10: qty 5.4

## 2017-05-10 NOTE — ED Triage Notes (Signed)
Pt asleep in lobby, in no acute distress

## 2017-05-10 NOTE — Discharge Instructions (Signed)
Follow-up with your doctor for further management the wounds.  Return for fevers chills or worsening of the wounds.

## 2017-05-10 NOTE — Progress Notes (Signed)
   05/10/17 1200  Clinical Encounter Type  Visited With Patient  Visit Type Initial;Psychological support;Spiritual support;ED  Referral From Nurse  Consult/Referral To Chaplain  Spiritual Encounters  Spiritual Needs Other (Comment) (Clothing )  Stress Factors  Patient Stress Factors Other (Comment) (Clothing )   I provided a pair of shoes for the patient.   Please, contact Spiritual Care for further assistance.   Chaplain Clint BolderBrittany Keller Mikels M.Div.

## 2017-05-10 NOTE — ED Provider Notes (Signed)
[name]-EMERGENCY DEPT Provider Note   CSN: [id number] Arrival date & time: 05/09/17  2253     History   Chief Complaint Chief Complaint  Patient presents with  . Depression    HPI [name] [name] is a 46 y.o. male.  HPI Patient presents with pain in both his feet.  Has a wound on the top of both his feet.  States that is been more painful.  No fevers.  No chest pain no trouble breathing.  Reportedly found at a gas station.  Had been drinking alcohol.  This is his 37 visit to the ER in the last 6 months.  No drainage besides some blood that goes onto his socks.  He is homeless.  States he does not have feeling in his feet.  Had reportedly told EMS he was suicidal but pares denies that. Past Medical History:  Diagnosis Date  . Bipolar depression (HCC)   . Gout   . Hypertension   . Pneumonia    had aspiration pneumonia 5/14-on vent  . Schizophrenia [facility](HCC)     Patient Active Problem List   Diagnosis Date Noted  . Sepsis (HCC) 04/28/2017  . Cellulitis of leg, right 04/28/2017  . Essential hypertension 04/28/2017  . Tobacco abuse 04/28/2017  . Polysubstance abuse (HCC) 04/28/2017  . Acute metabolic encephalopathy 04/28/2017  . Somnolence 01/09/2017  . Paranoid schizophrenia (HCC) 04/20/2013  . Acute respiratory failure with hypercapnia (HCC) 11/17/2012  . Encephalopathy acute 11/17/2012    Past Surgical History:  Procedure Laterality Date  . arm surgery     hurt his arm 25 hr ago  . OPEN REDUCTION INTERNAL FIXATION (ORIF) PROXIMAL PHALANX Right 04/02/2013   Procedure: OPEN REDUCTION INTERNAL FIXATION (ORIF) RIGHT SMALL FINGER;  Surgeon: Tami RibasKevin R Kuzma, MD;  Location: [facility];  Service: Orthopedics;  Laterality: Right;       Home Medications    Prior to Admission medications   Not on File    Family History Family History  Problem Relation Age of Onset  . Family history unknown: Yes    Social History Social History    Substance Use Topics  . Smoking status: Current Every Day Smoker    Types: Cigarettes  . Smokeless tobacco: Never Used  . Alcohol use Yes     Comment: Unknown last drink.      Allergies   Tomato   Review of Systems Review of Systems  Constitutional: Negative for appetite change, chills and fever.  HENT: Negative for congestion.   Respiratory: Negative for shortness of breath.   Gastrointestinal: Negative for abdominal pain.  Genitourinary: Negative for flank pain.  Musculoskeletal: Negative for back pain.  Skin: Positive for wound.  Neurological: Negative for seizures.  Hematological: Negative for adenopathy.  Psychiatric/Behavioral: Negative for confusion.     Physical Exam Updated Vital Signs There were no vitals taken for this visit.  Physical Exam  Constitutional: He appears well-developed.  HENT:  Head: Normocephalic.  Eyes: EOM are normal.  Neck: Neck supple.  Cardiovascular: Normal rate.   Pulmonary/Chest: Effort normal.  Abdominal: There is no tenderness.  Musculoskeletal: He exhibits edema.  Chronic venous changes bilateral lower extremities.  There is an approximately 1 cm ulcer on the dorsum of the right foot.  It has a clean base.  On the left foot there is a dorsal ulcer that is approximately 1/2 cm.  This is however darker.  No fluctuance.  No active drainage.  Neurological: He is alert.  Skin: Skin is warm. Capillary refill takes less than 2 seconds.     ED Treatments / Results  Labs (all labs ordered are listed, but only abnormal results are displayed) Labs Reviewed - No data to display  EKG  EKG Interpretation None       Radiology No results found.  Procedures Procedures (including critical care time)  Medications Ordered in ED Medications  bacitracin ointment (not administered)     Initial Impression / Assessment and Plan / ED Course  I have reviewed the triage vital signs and the nursing notes.  Pertinent labs & imaging  results that were available during my care of the patient were reviewed by me and considered in my medical decision making (see chart for details).     Patient with chronic wounds of both feet.  Some bloody drainage but no purulence.  No clear swelling.  Has had these wounds previously.  The one on the left foot is a little more worrisome but is not clearly infected at this time.  Has not had fevers.  Has not been doing wound care on it.  Will give supplies and will need to follow-up with his primary care doctor.  Denies suicidal thoughts at this time.  Frequent visits for same.  Will discharge home.  Final Clinical Impressions(s) / ED Diagnoses   Final diagnoses:  Skin ulcers of foot, bilateral (HCC)    New Prescriptions New Prescriptions   No medications on file     Benjiman Core, MD 05/10/17 1021

## 2017-05-11 ENCOUNTER — Emergency Department (HOSPITAL_COMMUNITY): Payer: Medicare Other

## 2017-05-11 ENCOUNTER — Emergency Department (HOSPITAL_COMMUNITY)
Admission: EM | Admit: 2017-05-11 | Discharge: 2017-05-11 | Disposition: A | Discharge status: Home / Self Care | Payer: Medicare Other | Attending: Emergency Medicine | Admitting: Emergency Medicine

## 2017-05-11 DIAGNOSIS — F17210 Nicotine dependence, cigarettes, uncomplicated: Secondary | ICD-10-CM | POA: Insufficient documentation

## 2017-05-11 DIAGNOSIS — M79672 Pain in left foot: Secondary | ICD-10-CM

## 2017-05-11 DIAGNOSIS — L97529 Non-pressure chronic ulcer of other part of left foot with unspecified severity: Secondary | ICD-10-CM

## 2017-05-11 DIAGNOSIS — M79671 Pain in right foot: Secondary | ICD-10-CM | POA: Insufficient documentation

## 2017-05-11 DIAGNOSIS — I10 Essential (primary) hypertension: Secondary | ICD-10-CM | POA: Insufficient documentation

## 2017-05-11 DIAGNOSIS — L97519 Non-pressure chronic ulcer of other part of right foot with unspecified severity: Secondary | ICD-10-CM

## 2017-05-11 MED ORDER — NALOXONE HCL 4 MG/0.1ML NA LIQD
1.0000 | Freq: Once | NASAL | Status: AC
  Administered 2017-05-11: 1 via NASAL
  Filled 2017-05-11: qty 4

## 2017-05-11 NOTE — ED Notes (Signed)
Pt sleeping soundly. Steady rise and fall of his respirations noted.

## 2017-05-11 NOTE — ED Notes (Signed)
Pt has bilateral 2+ pitting edema, with open ulcer noted to the top bilateral feet. Pt reports 8/10 pain.

## 2017-05-11 NOTE — ED Provider Notes (Signed)
[facility]-EMERGENCY DEPT Provider Note   CSN: [id number] Arrival date & time: 05/11/17  1700     History   Chief Complaint Chief Complaint  Patient presents with  . Foot Pain    HPI [name] [name] is a 46 y.o. male BIB EMS with past medical history of bipolar depression, gout, hypertension, schizophrenia who presents today for "generalized checkup" and chronic foot pain. Patient states that he came in because he needs "a general checkup." When prompted further about what complaint he has, he only complains of bilateral foot pain. Patient reports that this is been an ongoing issue for several months. Patient has known skin ulcers to the anterior aspect of the bilateral feet. He is able to injury but notes worsening pain with bilateral feet. Patient was seen here in the emergency room yesterday and had bandages change. Patient reports that he has not been cleaning the wounds or changing the bandages. He does not know any drainage from the wounds. Patient denies any alcohol use today. Patient denies any cocaine, heroine, marijuana use today. Patient denies any fevers, chills, redness or swelling of the legs, difficulty breathing, chest pain.  The history is provided by the patient.    Past Medical History:  Diagnosis Date  . Bipolar depression (HCC)   . Gout   . Hypertension   . Pneumonia    had aspiration pneumonia 5/14-on vent  . Schizophrenia [facility])     Patient Active Problem List   Diagnosis Date Noted  . Sepsis (HCC) 04/28/2017  . Cellulitis of leg, right 04/28/2017  . Essential hypertension 04/28/2017  . Tobacco abuse 04/28/2017  . Polysubstance abuse (HCC) 04/28/2017  . Acute metabolic encephalopathy 04/28/2017  . Somnolence 01/09/2017  . Paranoid schizophrenia (HCC) 04/20/2013  . Acute respiratory failure with hypercapnia (HCC) 11/17/2012  . Encephalopathy acute 11/17/2012    Past Surgical History:  Procedure Laterality Date  . arm surgery     hurt his arm 25 hr ago  . OPEN REDUCTION INTERNAL FIXATION (ORIF) PROXIMAL PHALANX Right 04/02/2013   Procedure: OPEN REDUCTION INTERNAL FIXATION (ORIF) RIGHT SMALL FINGER;  Surgeon: Tami Ribas, MD;  Location: [facility];  Service: Orthopedics;  Laterality: Right;       Home Medications    Prior to Admission medications   Not on File    Family History Family History  Problem Relation Age of Onset  . Family history unknown: Yes    Social History Social History  Substance Use Topics  . Smoking status: Current Every Day Smoker    Types: Cigarettes  . Smokeless tobacco: Never Used  . Alcohol use Yes     Comment: Unknown last drink.      Allergies   Tomato   Review of Systems Review of Systems  Constitutional: Negative for fever.  Respiratory: Negative for shortness of breath.   Cardiovascular: Negative for chest pain.  Musculoskeletal:       Bilateral foot pain  Skin: Positive for wound. Negative for color change.     Physical Exam Updated Vital Signs BP 137/85 (BP Location: Left Arm)   Pulse 72   Temp 98.9 F (37.2 C) (Oral)   Resp (!) 24   Ht 5\' 8"  (1.727 m)   Wt 117 kg (258 lb)   SpO2 97%   BMI 39.23 kg/m   Physical Exam  Constitutional: He is oriented to person, place, and time. He appears well-developed and well-nourished.  Imminently sleeping. Easily arousable by verbal stimuli.  HENT:  Head: Normocephalic and atraumatic.  Mouth/Throat: Oropharynx is clear and moist and mucous membranes are normal.  Eyes: Pupils are equal, round, and reactive to light. Conjunctivae, EOM and lids are normal.  Small, pinpoint pupils bilaterally. EOMs intact.   Neck: Full passive range of motion without pain.  Full flexion/extension and lateral movement of neck fully intact. No bony midline tenderness. No deformities or crepitus.   Cardiovascular: Normal rate, regular rhythm, normal heart sounds and normal pulses.  Exam reveals no gallop and no  friction rub.   No murmur heard. Pulses:      Dorsalis pedis pulses are 2+ on the right side, and 2+ on the left side.  Pulmonary/Chest: Effort normal. He has rhonchi.  No evidence of respiratory distress. Able to speak in full sentences without difficulty. Diffuse rhonchi noted throughout all lung fields.  Abdominal: Soft. Normal appearance. There is no tenderness. There is no rigidity and no guarding.  Musculoskeletal: Normal range of motion.  Neurological: He is alert and oriented to person, place, and time. GCS eye subscore is 4. GCS verbal subscore is 5. GCS motor subscore is 6.  Cranial nerves III-XII intact Follows commands, Moves all extremities  5/5 strength to BUE and BLE  Sensation intact throughout all major nerve distributions Normal finger to nose. No pronator drift. No gait abnormalities  No slurred speech. No facial droop.   Skin: Skin is warm and dry. Capillary refill takes less than 2 seconds.  Bilateral lower extremities with chronic venous stasis changes. There is a 1 cm skin ulcer noted to the dorsum aspect of the right foot. No surrounding warmth, erythema. No active drainage. 17 m skin ulcer noted to the dorsum aspect of the left foot. No surrounding warmth, erythema. No active drainage.   Psychiatric: He has a normal mood and affect. His speech is normal.  Nursing note and vitals reviewed.    ED Treatments / Results  Labs (all labs ordered are listed, but only abnormal results are displayed) Labs Reviewed - No data to display  EKG  EKG Interpretation None       Radiology Dg Foot Complete Left  Result Date: 05/11/2017 CLINICAL DATA:  Gout, foot pain neck soft tissue ulcer. EXAM: LEFT FOOT - COMPLETE 3+ VIEW COMPARISON:  None. FINDINGS: Moderate diffuse soft tissue swelling of the included ankle and foot with soft tissue ulceration along the dorsum of the midfoot. No underlying osteomyelitis or fracture. Pes planus deformity of the foot is noted with  resultant degenerative change of the midfoot articulations. IMPRESSION: 1. Soft tissue swelling with small dorsal soft tissue ulcer along the midfoot. 2. No underlying fracture or osteomyelitis. 3. Pes planus deformity. Electronically Signed   By: Tollie Ethavid  Kwon M.D.   On: 05/11/2017 19:21   Dg Foot Complete Right  Result Date: 05/11/2017 CLINICAL DATA:  Gout.  Foot pain.  Wound on top of both feet. EXAM: RIGHT FOOT COMPLETE - 3+ VIEW COMPARISON:  Exams dating back through 04/20/2017 FINDINGS: Pes planus deformity of the foot with degenerative joint space narrowing of the midfoot articulations. No frank bone destruction or fracture. No subcutaneous emphysema. Diffuse soft tissue swelling seen along the dorsum and medial aspect of the foot and ankle. Along the dorsum of the midfoot is soft tissue ulceration adjacent the base of the first metatarsal. No soft tissue mass or mineralization. IMPRESSION: 1. Soft tissue swelling without subcutaneous emphysema. 2. Soft tissue ulcer adjacent to the base of the first metatarsal. 3. No acute fracture  nor findings of acute osteomyelitis. 4. Pes planus. Electronically Signed   By: Tollie Eth M.D.   On: 05/11/2017 19:18    Procedures Procedures (including critical care time)  Medications Ordered in ED Medications  naloxone (NARCAN) nasal spray 4 mg/0.1 mL (not administered)     Initial Impression / Assessment and Plan / ED Course  I have reviewed the triage vital signs and the nursing notes.  Pertinent labs & imaging results that were available during my care of the patient were reviewed by me and considered in my medical decision making (see chart for details).     46 y.o. male who presents with complaints of wanting a "generalized checkup." On further evaluation, patient has no other complaints besides chronic foot pain. He has known skin ulcers to the dorsal aspect of bilateral feet. Seen in the emergency room yesterday for similar symptoms and had  bandages change this time. He had notes that he has not changed the bandages since then. No new trauma, injury, fall. Patient was initially intermittently sleeping and snoring. He is easily arousable to verbal stimuli. Throughout the examination, he will go back to sleep but will easily be aroused by verbal stimuli. Physical exam to choke and point pupils bilaterally. Patient does not endorse any alcohol, illicit drug use. Patient is A&O 3 with a normal neuro exam. Given patient's complaint of worsening bilateral feet pain, we'll plan to do x-ray evaluation for evaluation of subcutaneous emphysema.  Given that patient would intermittently go back to sleep and had pintpoint pupils, ordered Narcan to see if that would help patient become more alert. When nurse went to go get Narcan, patient woke up and said "what the hell are you doing, I don't want that." Explained to patient that as long as he is arousable and answering our questions, he does not have to have the Narcan. Patient is agreeable to plan.  X-rays reviewed. Negative for any acute fracture dislocation. No evidence of subcutaneous emphysema. Discussed results with patient. We'll plan to provide wound care and bandage change in the department. Patient is asking for something to eat.  Wounds bandaged and cleaned. Instructed patient to follow-up with his primary care doctor for further evaluation and to complete a physical exam for his "generalized checkup." Wound precautions discussed with patient. Patient easily arousable by verbal stimuli. He will sit up and answer questions during examination. Patient stable for discharge at this time.  Final Clinical Impressions(s) / ED Diagnoses   Final diagnoses:  None    New Prescriptions New Prescriptions   No medications on file     Rosana Hoes 05/11/17 [id number]    Linwood Dibbles, MD 05/11/17 [id number]

## 2017-05-11 NOTE — Discharge Instructions (Signed)
Make sure you're regularly changing her bandages. Make sure you're keeping her foot wounds clean and dry.  He did take Tylenol or ibuprofen as needed for pain.  Follow-up with your primary care doctor in the next 24-48 hours for further evaluation.  Return the emergency Department for any worsening pain, fever, redness or swelling of the foot that extends up towards the left leg, numbness or weakness of the foot or any other worsening or concerning symptoms.

## 2017-05-11 NOTE — ED Triage Notes (Signed)
Pt bib EMS. Pt has no complaints other than left foot pain. Pt reports he wants a "Generalized checkup"

## 2017-06-10 DEATH — deceased

## 2019-02-14 IMAGING — DX DG CHEST 1V PORT
1 series · 1 of 1 positions shown · non-contrast
Comparison: Two-view chest x-ray 01/09/2017

CLINICAL DATA: Bilateral leg pain for 1 week

EXAM:
PORTABLE CHEST 1 VIEW

[chest ap]
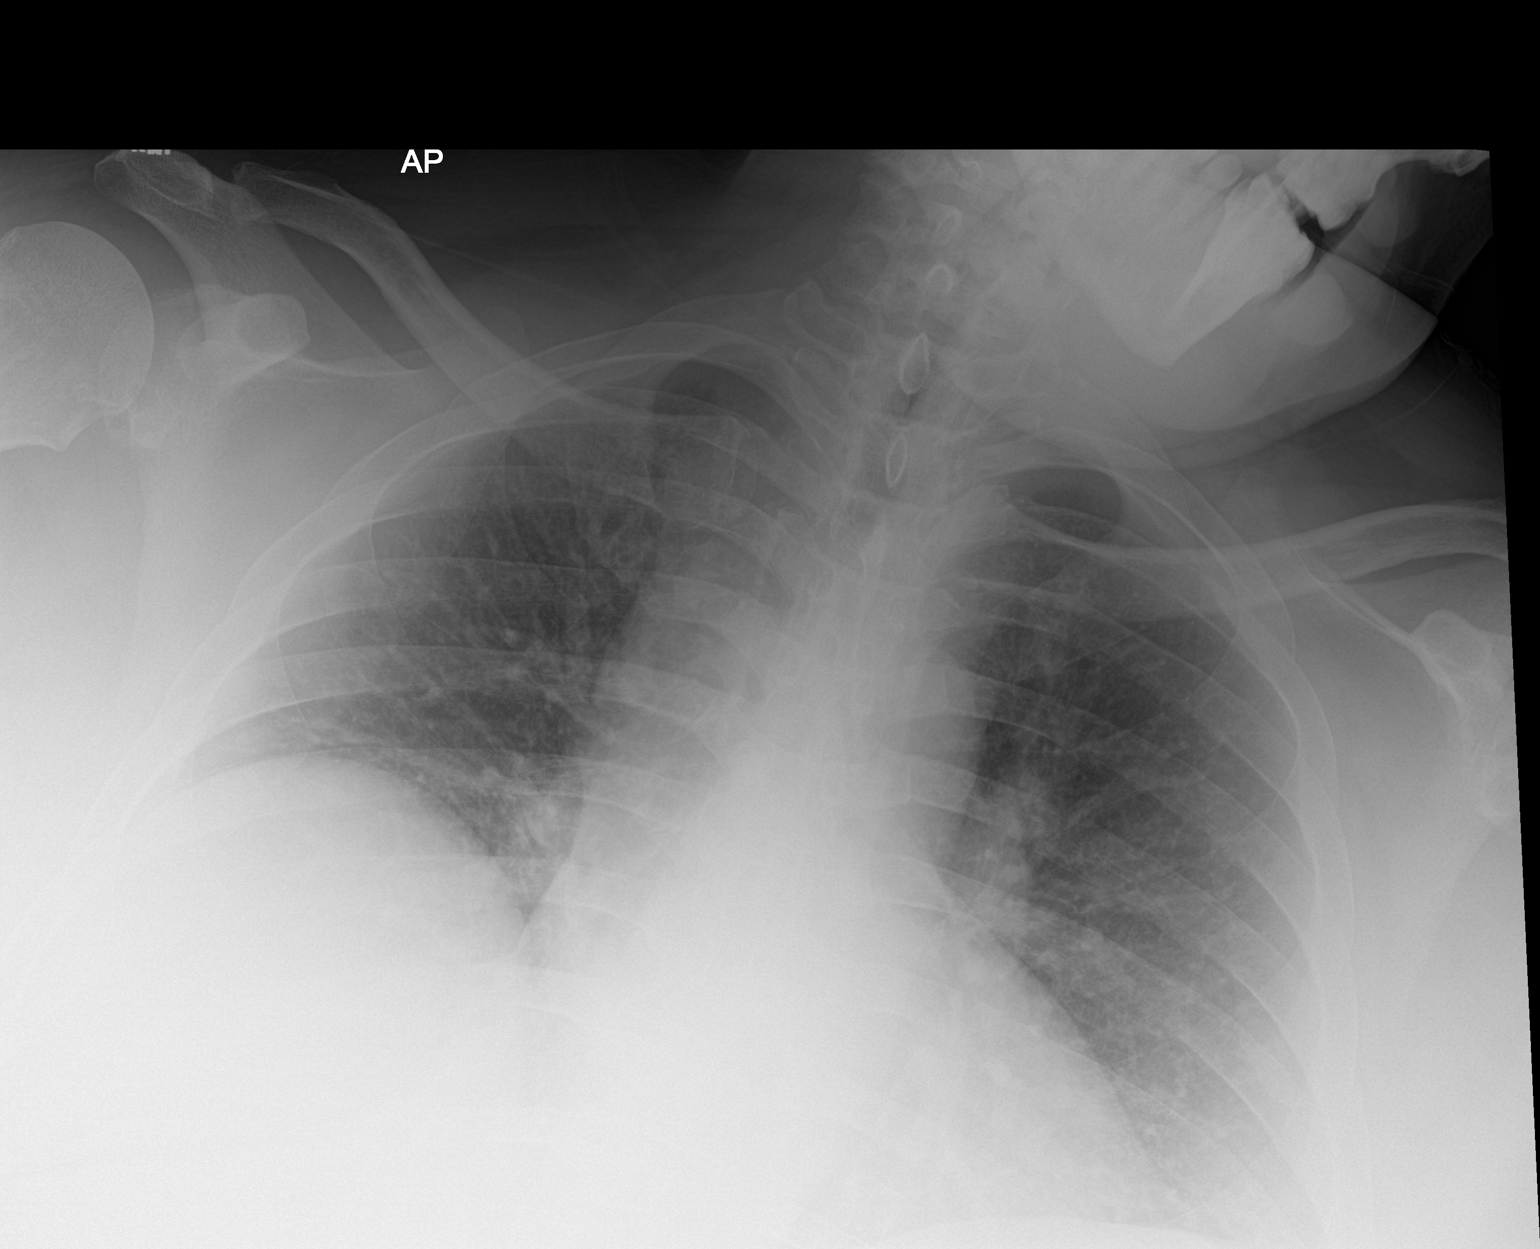

[1 of 1 positions shown; findings below may reference images not displayed]

FINDINGS: Heart size normal. The heart size is exaggerated by low lung
volumes. There is no edema or effusion. No focal airspace disease is
present.
IMPRESSION: 1. Low lung volumes.
2. No acute cardiopulmonary disease.

## 2019-04-15 IMAGING — CR DG ANKLE COMPLETE 3+V*R*
5 series · 5 of 5 positions shown · non-contrast
Comparison: None.

CLINICAL DATA: Right ankle pain.

EXAM:
RIGHT ANKLE - COMPLETE 3+ VIEW

[x ankle ap right (1 of 2)]
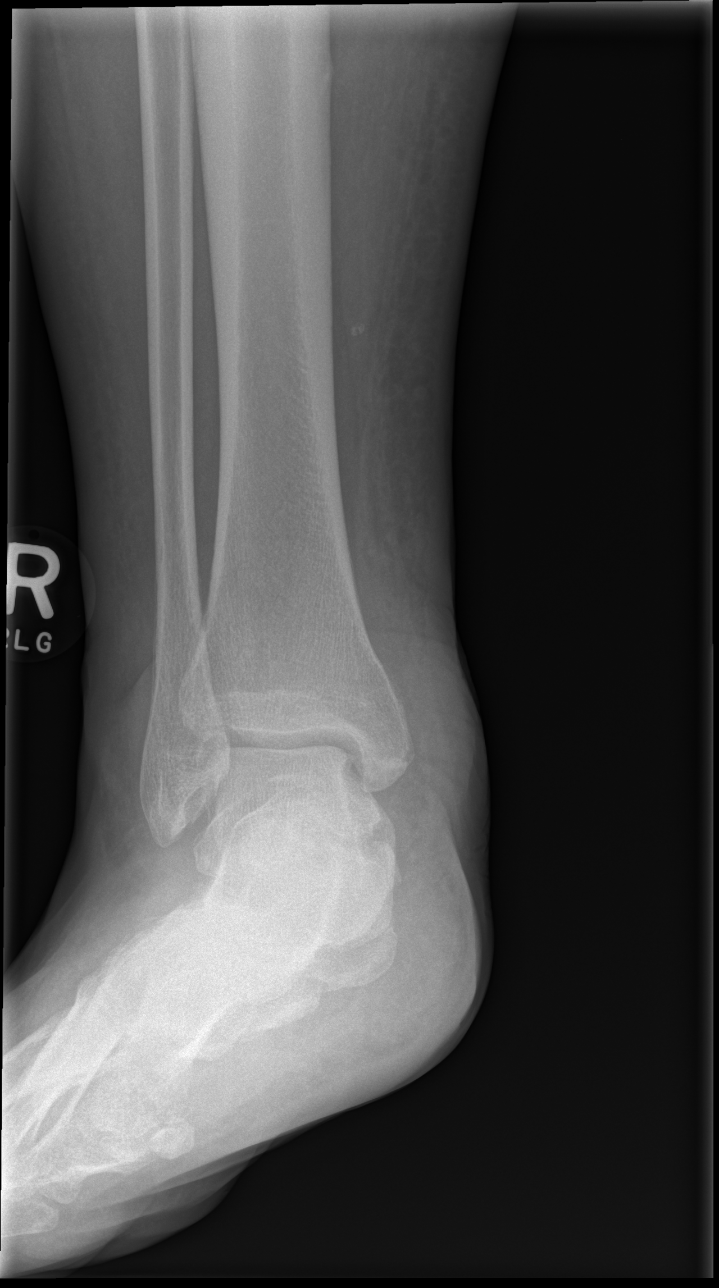

[x ankle ap right (2 of 2)]
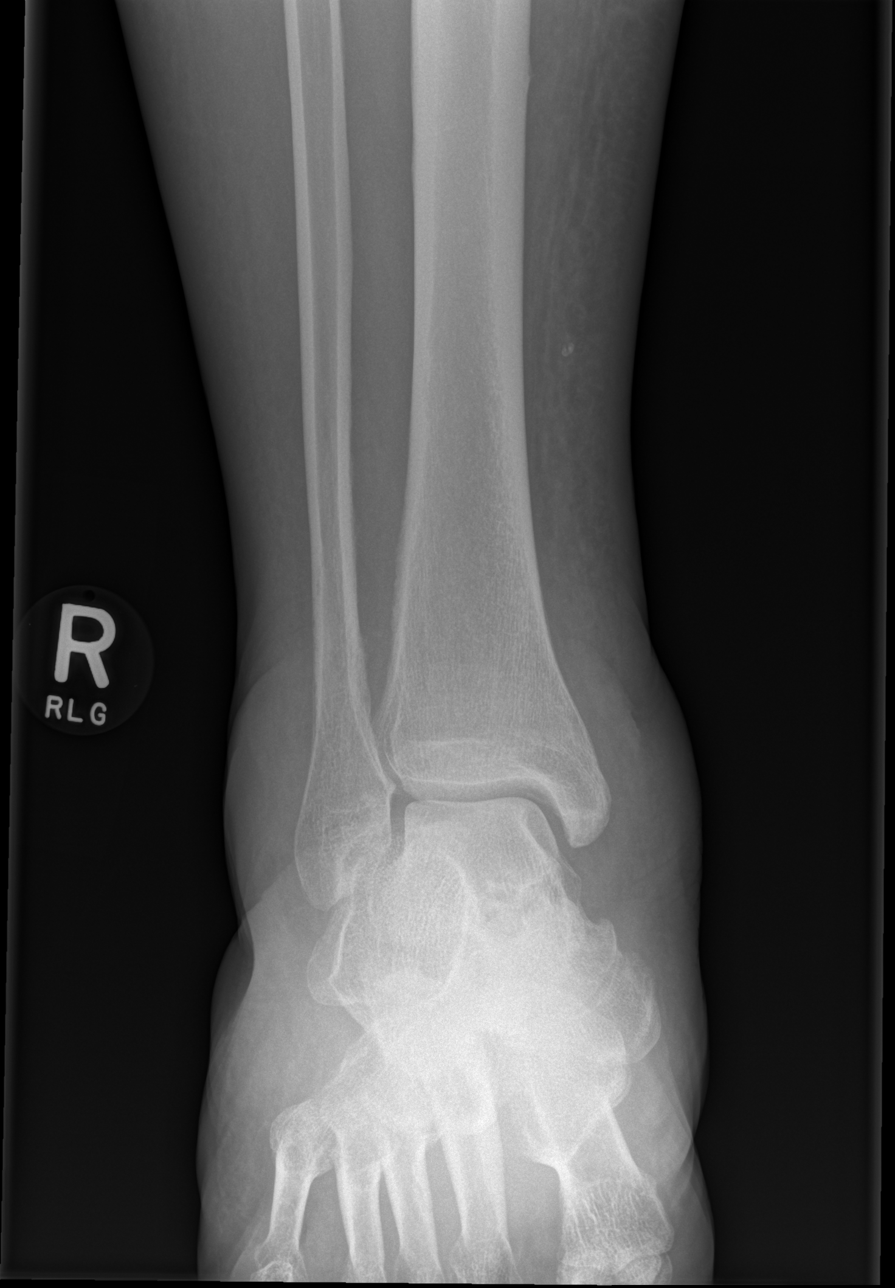

[x ankle obl right (1 of 2)]
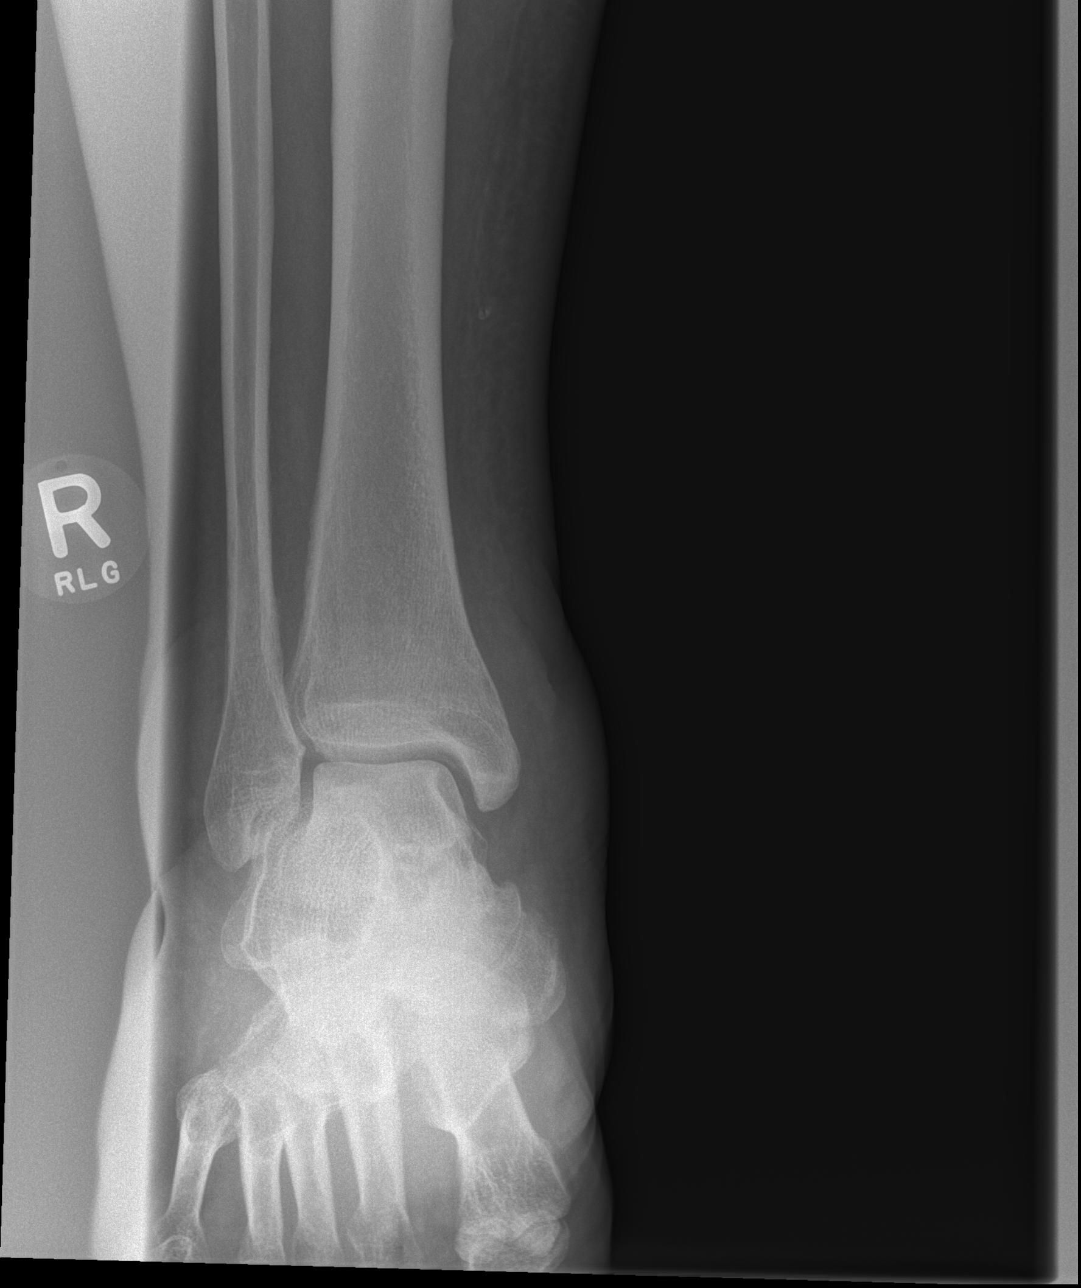

[x ankle obl right (2 of 2)]
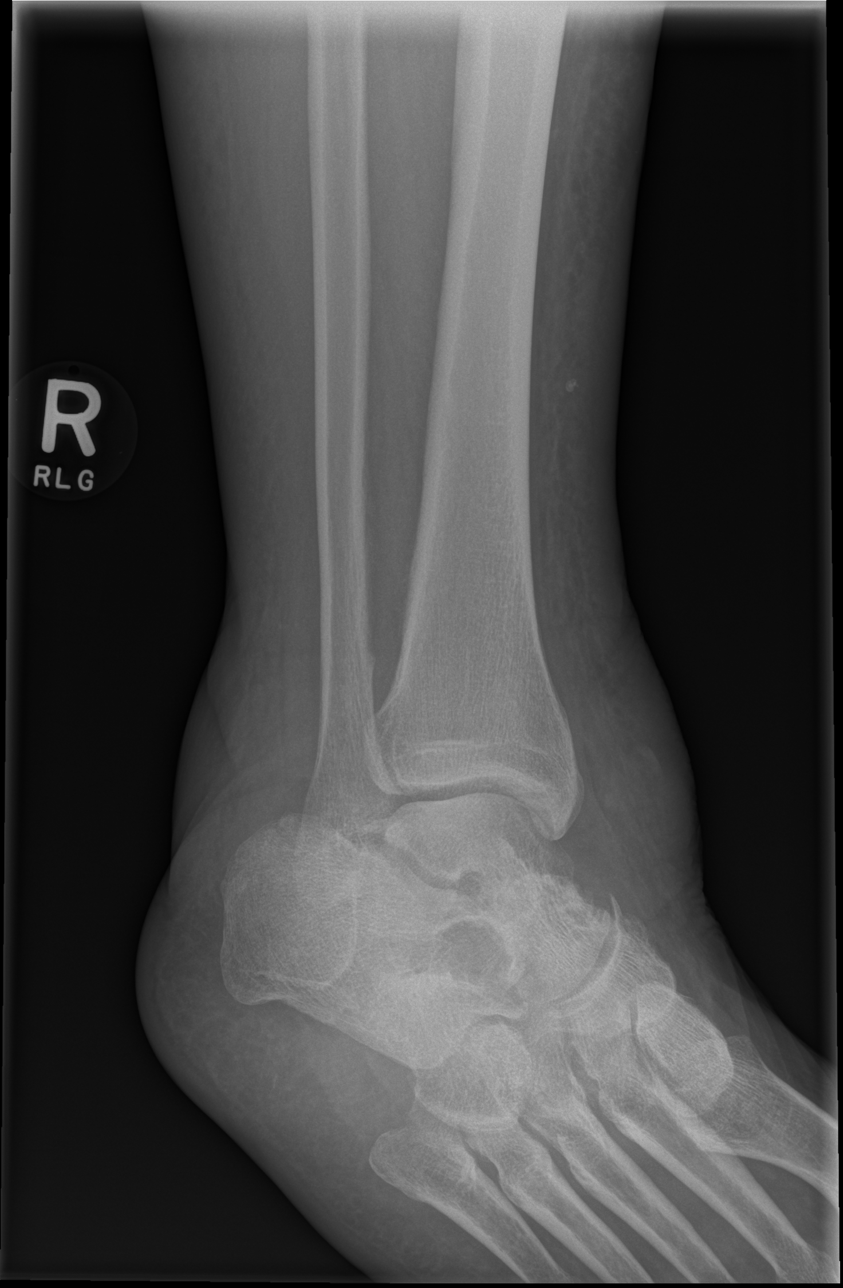

[x ankle lat right]
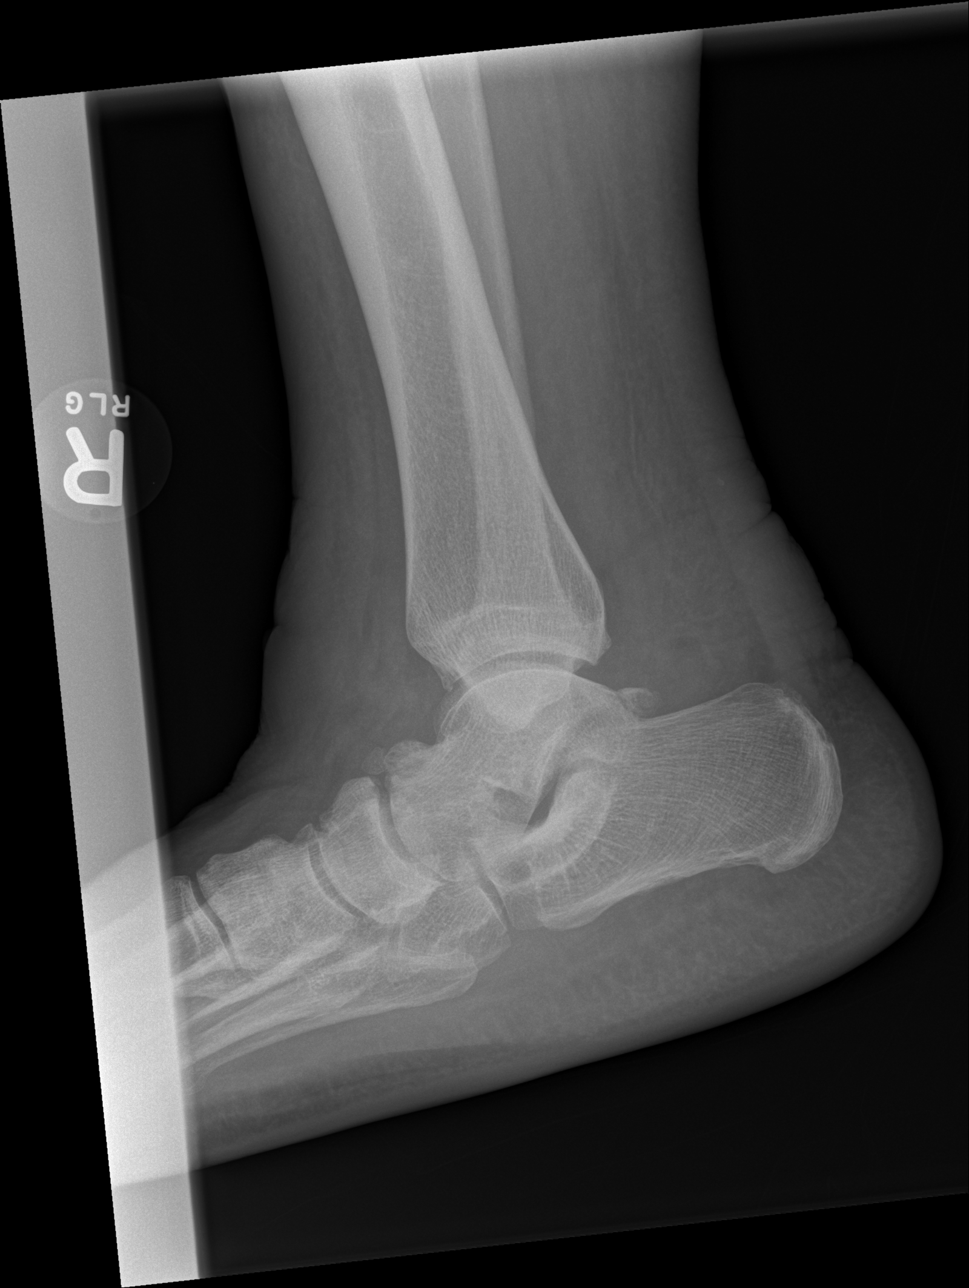

[5 of 5 positions shown; findings below may reference images not displayed]

FINDINGS: Diffuse soft tissue swelling. No underlying bony abnormality. No
fracture, subluxation or dislocation.
IMPRESSION: No acute bony abnormality.

## 2019-04-15 IMAGING — CR DG FOOT 2V*R*
3 series · 3 of 3 positions shown · non-contrast
Comparison: 04/20/2017.

CLINICAL DATA: Ankle and foot pain.

EXAM:
RIGHT FOOT - 2 VIEW

[x foot ap right (1 of 2)]
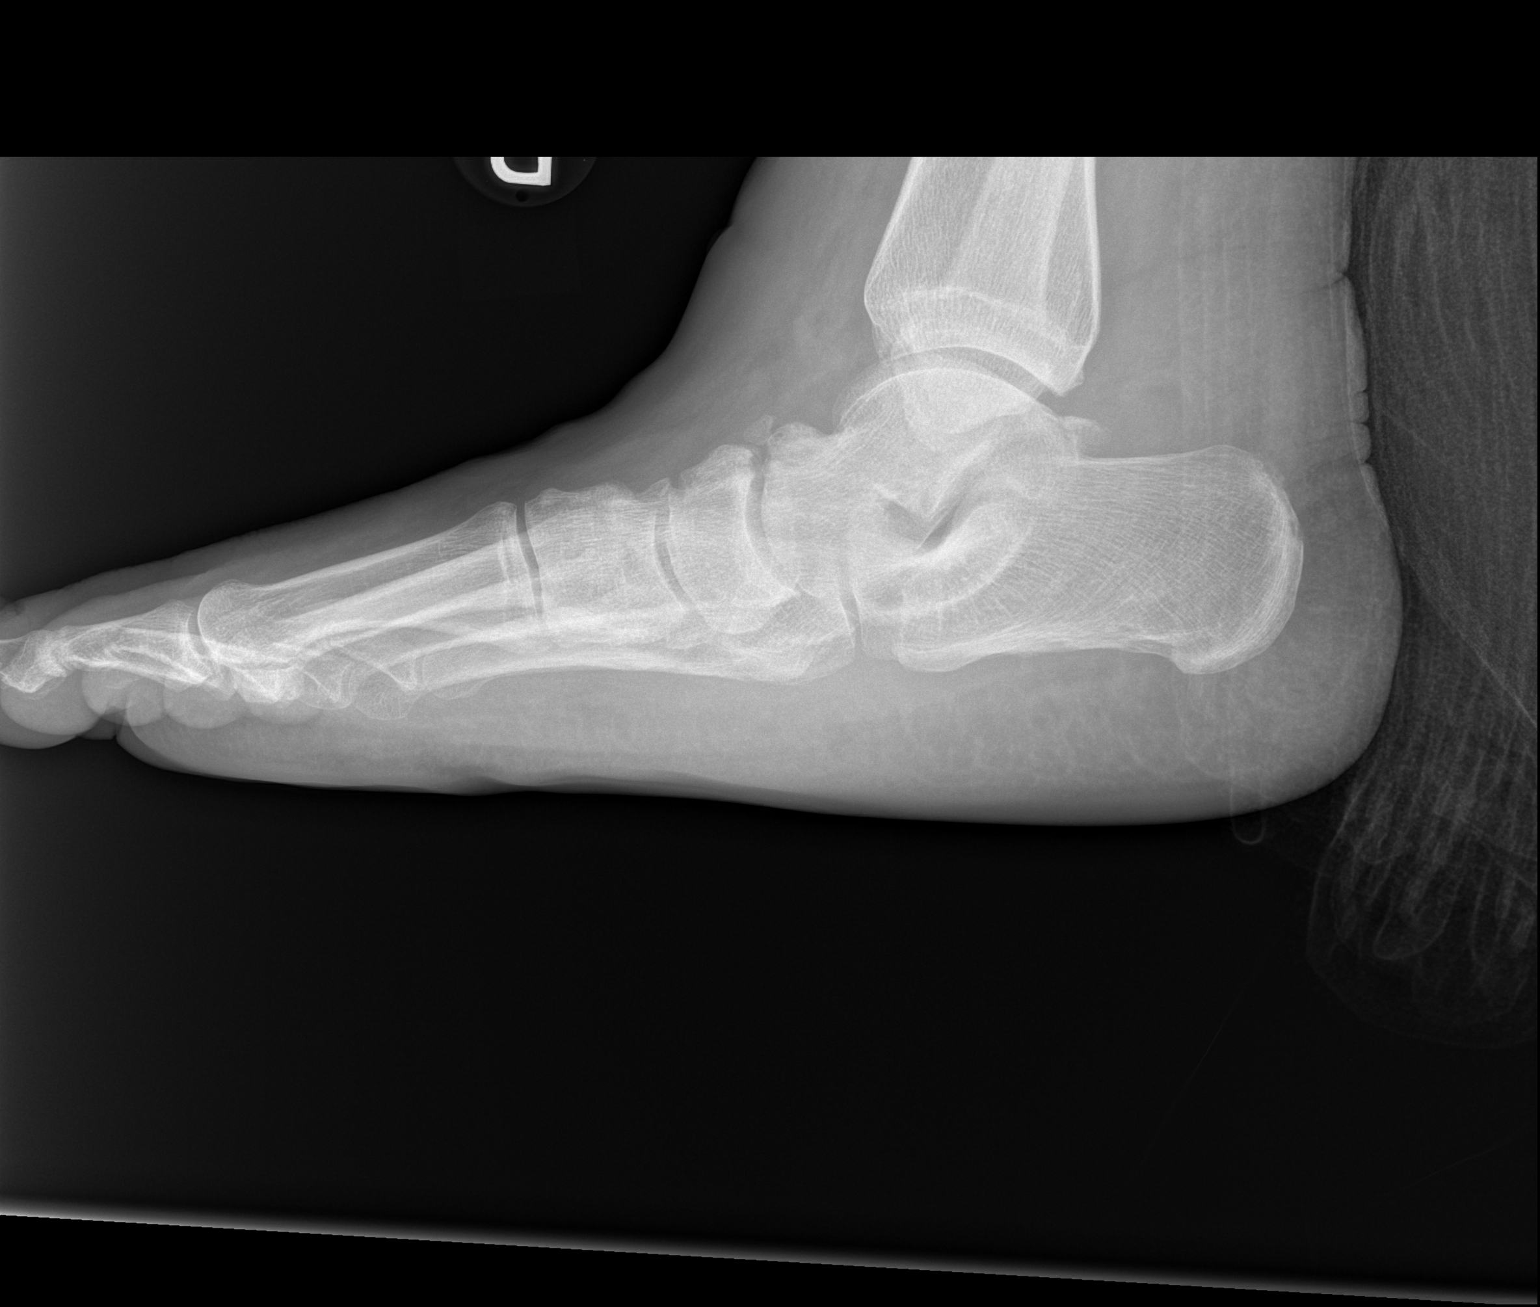

[x foot lat right]
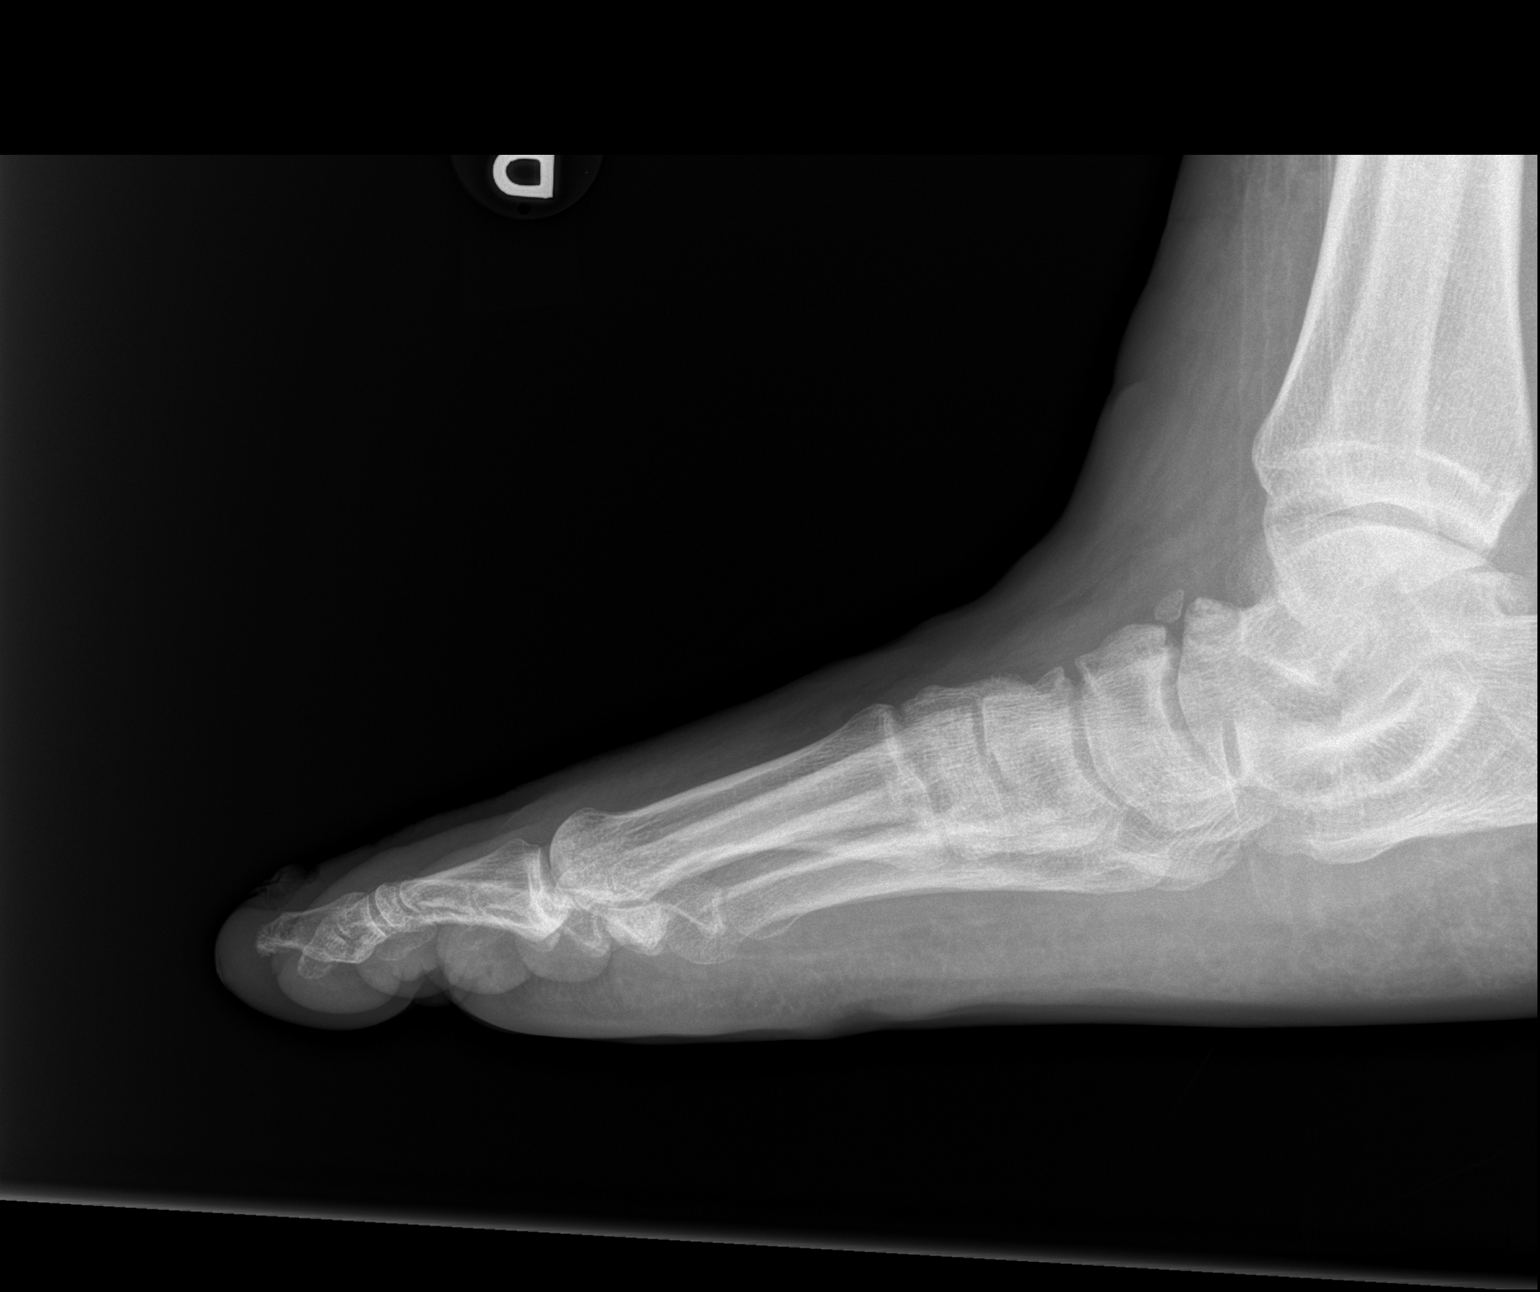

[x foot ap right (2 of 2)]
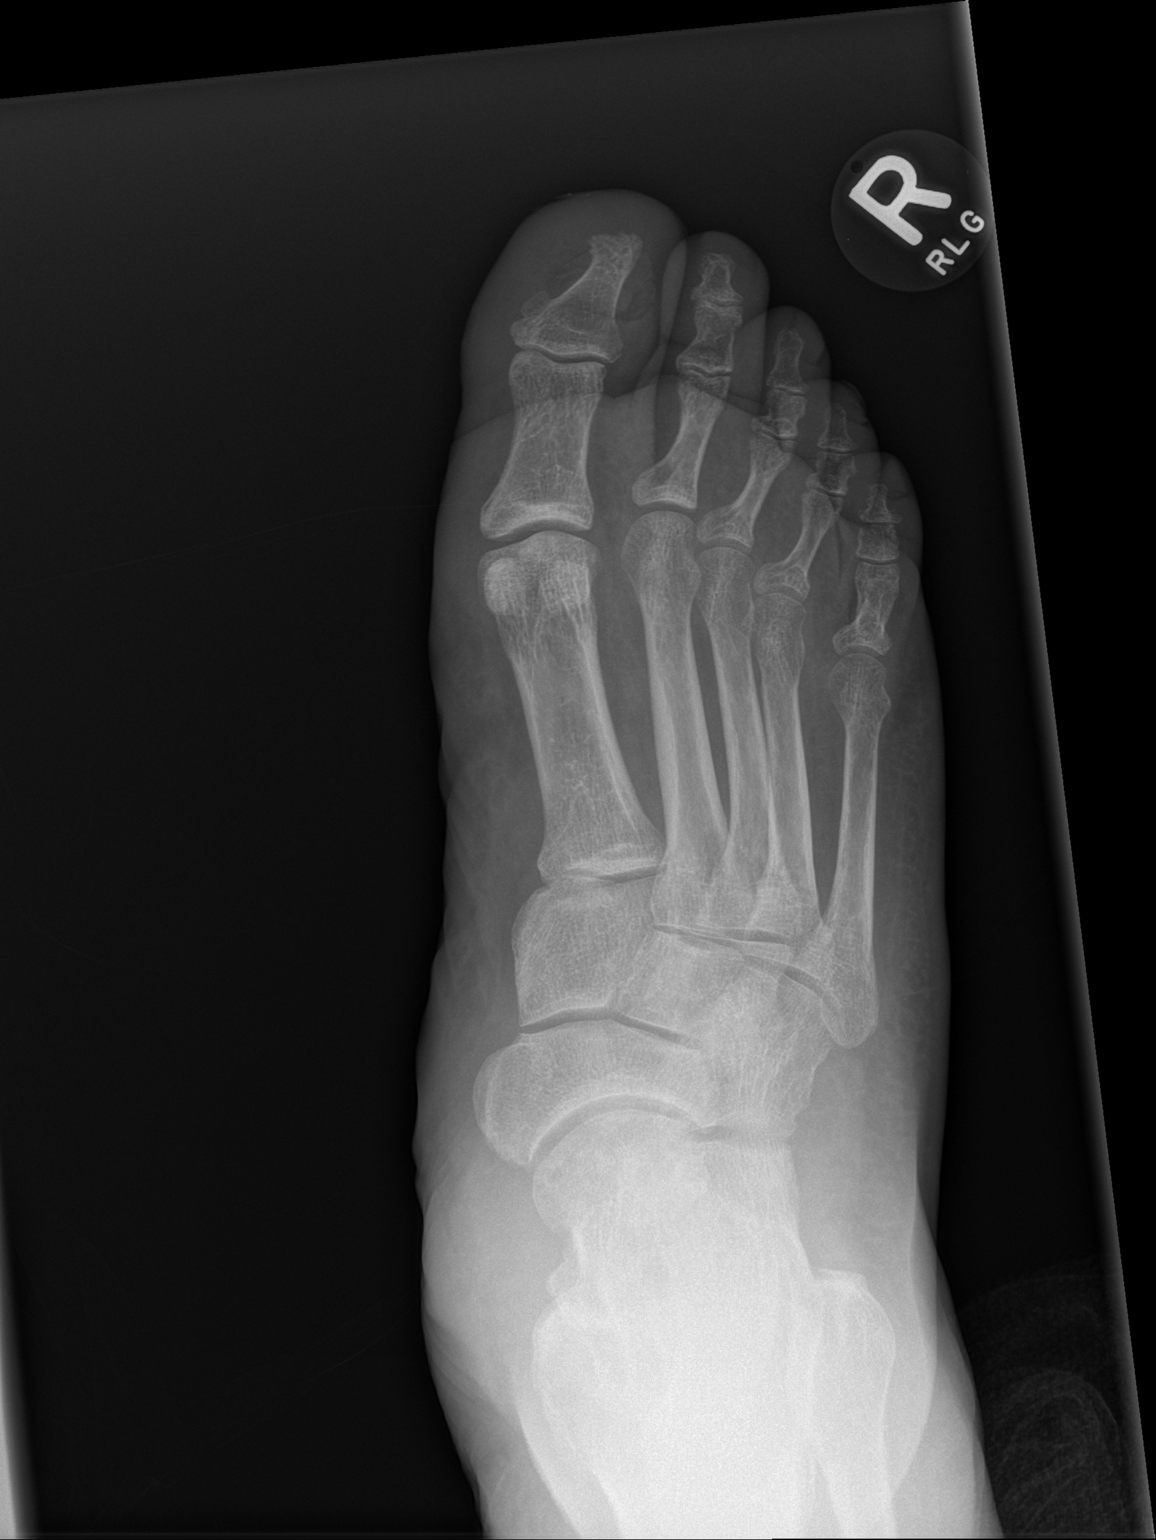

[3 of 3 positions shown; findings below may reference images not displayed]

FINDINGS: Deformity noted of the proximal phalanx of the right fifth digit,
this may be from old injury. No acute bony or joint abnormality
identified. No evidence fracture or dislocation. Diffuse
degenerative change.
IMPRESSION: Deformity noted of the proximal phalanx of the right fifth digit.
This may be from old injury. Diffuse degenerative change. No acute
abnormality identified.
